# Patient Record
Sex: Female | Born: 1950
Health system: Southern US, Community
[De-identification: ages and names within clinical notes are randomized; demographics above are authoritative.]

## PROBLEM LIST (undated history)

## (undated) ENCOUNTER — Ambulatory Visit

## (undated) DIAGNOSIS — K219 Gastro-esophageal reflux disease without esophagitis: Secondary | ICD-10-CM

## (undated) DIAGNOSIS — G4730 Sleep apnea, unspecified: Secondary | ICD-10-CM

## (undated) DIAGNOSIS — R51 Headache: Secondary | ICD-10-CM

## (undated) DIAGNOSIS — F419 Anxiety disorder, unspecified: Secondary | ICD-10-CM

## (undated) DIAGNOSIS — F329 Major depressive disorder, single episode, unspecified: Secondary | ICD-10-CM

## (undated) DIAGNOSIS — H269 Unspecified cataract: Secondary | ICD-10-CM

## (undated) DIAGNOSIS — D126 Benign neoplasm of colon, unspecified: Secondary | ICD-10-CM

## (undated) DIAGNOSIS — T7840XA Allergy, unspecified, initial encounter: Secondary | ICD-10-CM

## (undated) DIAGNOSIS — M1990 Unspecified osteoarthritis, unspecified site: Secondary | ICD-10-CM

## (undated) DIAGNOSIS — F32A Depression, unspecified: Secondary | ICD-10-CM

## (undated) DIAGNOSIS — U071 COVID-19: Secondary | ICD-10-CM

## (undated) HISTORY — DX: Anxiety disorder, unspecified: F41.9

## (undated) HISTORY — PX: BREAST ENHANCEMENT SURGERY: SHX7

## (undated) HISTORY — PX: LUMBAR DISC SURGERY: SHX700

## (undated) HISTORY — DX: Unspecified cataract: H26.9

## (undated) HISTORY — DX: Major depressive disorder, single episode, unspecified: F32.9

## (undated) HISTORY — PX: COLONOSCOPY: SHX174

## (undated) HISTORY — DX: Headache: R51

## (undated) HISTORY — PX: TOTAL ABDOMINAL HYSTERECTOMY: SHX209

## (undated) HISTORY — DX: Gastro-esophageal reflux disease without esophagitis: K21.9

## (undated) HISTORY — PX: UPPER GASTROINTESTINAL ENDOSCOPY: SHX188

## (undated) HISTORY — PX: ANKLE SURGERY: SHX546

## (undated) HISTORY — DX: Unspecified osteoarthritis, unspecified site: M19.90

## (undated) HISTORY — DX: Benign neoplasm of colon, unspecified: D12.6

## (undated) HISTORY — DX: Allergy, unspecified, initial encounter: T78.40XA

## (undated) HISTORY — PX: TONSILLECTOMY: SUR1361

## (undated) HISTORY — DX: Depression, unspecified: F32.A

## (undated) HISTORY — DX: Sleep apnea, unspecified: G47.30

---

## 1997-07-19 ENCOUNTER — Other Ambulatory Visit: Admission: RE | Admit: 1997-07-19 | Discharge: 1997-07-19 | Payer: Self-pay | Admitting: Gynecology

## 1999-10-23 ENCOUNTER — Other Ambulatory Visit: Admission: RE | Admit: 1999-10-23 | Discharge: 1999-10-23 | Payer: Self-pay | Admitting: Obstetrics and Gynecology

## 1999-12-09 ENCOUNTER — Ambulatory Visit (HOSPITAL_COMMUNITY): Admission: RE | Admit: 1999-12-09 | Discharge: 1999-12-09 | Payer: Self-pay | Admitting: Orthopedic Surgery

## 1999-12-09 ENCOUNTER — Encounter: Payer: Self-pay | Admitting: Orthopedic Surgery

## 2000-11-05 ENCOUNTER — Other Ambulatory Visit: Admission: RE | Admit: 2000-11-05 | Discharge: 2000-11-05 | Payer: Self-pay | Admitting: Obstetrics and Gynecology

## 2001-12-10 ENCOUNTER — Other Ambulatory Visit: Admission: RE | Admit: 2001-12-10 | Discharge: 2001-12-10 | Payer: Self-pay | Admitting: Obstetrics and Gynecology

## 2003-01-06 ENCOUNTER — Other Ambulatory Visit: Admission: RE | Admit: 2003-01-06 | Discharge: 2003-01-06 | Payer: Self-pay | Admitting: Obstetrics and Gynecology

## 2003-05-05 ENCOUNTER — Encounter: Admission: RE | Admit: 2003-05-05 | Discharge: 2003-05-05 | Payer: Self-pay | Admitting: Internal Medicine

## 2003-06-02 ENCOUNTER — Encounter: Payer: Self-pay | Admitting: Gastroenterology

## 2003-06-25 DIAGNOSIS — D126 Benign neoplasm of colon, unspecified: Secondary | ICD-10-CM

## 2003-06-25 HISTORY — DX: Benign neoplasm of colon, unspecified: D12.6

## 2004-01-11 ENCOUNTER — Encounter: Admission: RE | Admit: 2004-01-11 | Discharge: 2004-01-11 | Payer: Self-pay | Admitting: Sports Medicine

## 2004-02-05 ENCOUNTER — Other Ambulatory Visit: Admission: RE | Admit: 2004-02-05 | Discharge: 2004-02-05 | Payer: Self-pay | Admitting: Obstetrics and Gynecology

## 2004-03-28 ENCOUNTER — Ambulatory Visit (HOSPITAL_COMMUNITY): Admission: RE | Admit: 2004-03-28 | Discharge: 2004-03-28 | Payer: Self-pay | Admitting: Obstetrics and Gynecology

## 2004-04-23 ENCOUNTER — Emergency Department (HOSPITAL_COMMUNITY): Admission: EM | Admit: 2004-04-23 | Discharge: 2004-04-24 | Payer: Self-pay | Admitting: Emergency Medicine

## 2004-05-02 ENCOUNTER — Inpatient Hospital Stay (HOSPITAL_COMMUNITY): Admission: RE | Admit: 2004-05-02 | Discharge: 2004-05-04 | Payer: Self-pay | Admitting: Obstetrics and Gynecology

## 2004-05-02 ENCOUNTER — Encounter (INDEPENDENT_AMBULATORY_CARE_PROVIDER_SITE_OTHER): Payer: Self-pay | Admitting: Specialist

## 2005-03-12 ENCOUNTER — Other Ambulatory Visit: Admission: RE | Admit: 2005-03-12 | Discharge: 2005-03-12 | Payer: Self-pay | Admitting: Obstetrics and Gynecology

## 2005-08-15 ENCOUNTER — Encounter: Admission: RE | Admit: 2005-08-15 | Discharge: 2005-08-15 | Payer: Self-pay | Admitting: Sports Medicine

## 2006-03-30 ENCOUNTER — Encounter: Admission: RE | Admit: 2006-03-30 | Discharge: 2006-03-30 | Payer: Self-pay | Admitting: Internal Medicine

## 2008-02-25 DIAGNOSIS — H269 Unspecified cataract: Secondary | ICD-10-CM

## 2008-02-25 HISTORY — DX: Unspecified cataract: H26.9

## 2008-04-12 ENCOUNTER — Encounter (INDEPENDENT_AMBULATORY_CARE_PROVIDER_SITE_OTHER): Payer: Self-pay | Admitting: *Deleted

## 2008-08-22 ENCOUNTER — Telehealth: Payer: Self-pay | Admitting: Gastroenterology

## 2008-10-02 ENCOUNTER — Ambulatory Visit: Payer: Self-pay | Admitting: Gastroenterology

## 2008-10-02 DIAGNOSIS — K589 Irritable bowel syndrome without diarrhea: Secondary | ICD-10-CM | POA: Insufficient documentation

## 2008-10-02 DIAGNOSIS — Z860100 Personal history of colon polyps, unspecified: Secondary | ICD-10-CM | POA: Insufficient documentation

## 2008-10-02 DIAGNOSIS — K219 Gastro-esophageal reflux disease without esophagitis: Secondary | ICD-10-CM | POA: Insufficient documentation

## 2008-10-06 ENCOUNTER — Telehealth: Payer: Self-pay | Admitting: Gastroenterology

## 2008-11-30 ENCOUNTER — Encounter (INDEPENDENT_AMBULATORY_CARE_PROVIDER_SITE_OTHER): Payer: Self-pay | Admitting: *Deleted

## 2008-12-19 ENCOUNTER — Ambulatory Visit: Payer: Self-pay | Admitting: Gastroenterology

## 2009-01-02 ENCOUNTER — Ambulatory Visit: Payer: Self-pay | Admitting: Gastroenterology

## 2009-01-02 ENCOUNTER — Encounter: Payer: Self-pay | Admitting: Gastroenterology

## 2009-01-02 ENCOUNTER — Telehealth: Payer: Self-pay | Admitting: Gastroenterology

## 2009-01-05 ENCOUNTER — Encounter: Payer: Self-pay | Admitting: Gastroenterology

## 2010-07-12 NOTE — Discharge Summary (Signed)
NAMEBREANNA, Taylor Hopkins             ACCOUNT NO.:  [id number]   MEDICAL RECORD NO.:  [id number]          PATIENT TYPE:  INP   LOCATION:  9316                          FACILITY:  [facility]   PHYSICIAN:  Guy Sandifer. Tomblin II, M.D.DATE OF BIRTH:  Jan 01, 1951   DATE OF ADMISSION:  05/02/2004  DATE OF DISCHARGE:  05/04/2004                                 DISCHARGE SUMMARY   ADMITTING DIAGNOSIS:  Left adnexal cyst.   DISCHARGE DIAGNOSIS:  Left adnexal cyst.   PROCEDURE:  On May 02, 2004, total abdominal hysterectomy with bilateral  salpingo-oophorectomy.   REASON FOR ADMISSION:  This patient is a 60 year old married white female  G3, P2 with a left adnexal mass.  Details are dictated in the history and  physical.  She is admitted for surgical management.   HOSPITAL COURSE:  The patient is admitted to the hospital and undergoes the  above procedure.  Estimated blood loss was 200 mL.  On the evening of  surgery, she has good pain relief and stable vital signs.  She remains  afebrile.  Urine output is somewhat concentrated, and she is given an IV  fluid bolus.  The following morning, she has good pain relief and is voiding  well.  White count is 7.8, hemoglobin 11.9.  On the day of discharge, she is  passing flatus, tolerating a regular diet.  Final pathology is pending.   CONDITION ON DISCHARGE:  Good.   DIET:  Regular as tolerated.   ACTIVITY:  No lifting, no operation of automobiles, no vaginal entry.  She  is to call the office for problems including but not limited to heavy  vaginal bleeding, temperature of 101 degrees, persistent nausea, vomiting,  or increasing pain.   MEDICATIONS:  1.  Percocet 5/325 mg #30 1-2 p.o. q.6h. p.r.n.  2.  Ibuprofen 600 mg q.6h. p.r.n.  3.  Multivitamin daily.   FOLLOW-UP:  In the office in 2 weeks.      JET/MEDQ  D:  05/04/2004  T:  05/04/2004  Job:  [id number]

## 2010-07-12 NOTE — Op Note (Signed)
Taylor Hopkins, Taylor Hopkins             ACCOUNT NO.:  [id number]   MEDICAL RECORD NO.:  [id number]          PATIENT TYPE:  INP   LOCATION:  9399                          FACILITY:  [facility]   PHYSICIAN:  Guy Sandifer. Tomblin II, M.D.DATE OF BIRTH:  Oct 21, 1950   DATE OF PROCEDURE:  05/02/2004  DATE OF DISCHARGE:                                 OPERATIVE REPORT   POSTOPERATIVE DIAGNOSIS:  Left adnexal cyst.   POSTOPERATIVE DIAGNOSIS:  Left adnexal cyst.   PROCEDURE:  Total abdominal hysterectomy with bilateral salpingo-  oophorectomy.   SURGEON:  Guy Sandifer. Henderson Cloud, M.D.   ASSISTANT:  Raynald Kemp, M.D.   ANESTHESIA:  General with endotracheal intubation.   SPECIMENS:  1.  Uterus with bilateral tubes and ovaries.  2.  Peritoneal washings.   ESTIMATED BLOOD LOSS:  200 cc.   INDICATIONS AND CONSENT:  This patient is a 60 year old married white  female, G3, P2 four years postmenopausal with acute onset of left lower  quadrant pain. She has a left adnexal cyst.  Details are dictated in the  history and physical.  Total abdominal hysterectomy and bilateral salpingo-  oophorectomy has been discussed with the patient.  The potential risks and  complications have been discussed, including but not limited to, infection,  bowel, bladder, ureteral damage; bleeding requiring transfusion of blood  products with possible transfusion reaction, HIV and hepatitis acquisition;  DVT, PE, pneumonia; fistula formation; and postoperative dyspareunia.  All  questions have been answered, and consent is signed on the chart.   FINDINGS:  Upper abdomen palpates normally.  There is a smooth 10 to 12-cm  left ovarian cyst that is partially torsed.  The right ovary and uterus are  normal.   DESCRIPTION OF PROCEDURE:  The patient is taken to the operating room where  she is identified and placed in the dorsal supine position.  General  anesthesia is induced via endotracheal intubation.  She is then prepped  abdominally and vaginally.  A Foley catheter is placed, and the bladder is  drained.  She is draped in a sterile fashion.   A Pfannenstiel incision is made, and dissection is carried out in layers to  the peritoneum which is entered and extended superiorly and inferiorly.  Peritoneal washings are then taken and sent for cytology.  An O'Connor-  O'Sullivan self-retaining retractor is placed.  A bladder blade is placed.  The bowel is packed away and the upper blade was placed.  The left ovary is  elevated into the field.  The ovary is well clear of the pelvic sidewall and  the course of the ureter.  The infundibulopelvic ligament is clamped just  beneath the ovary, and the specimen is cut free and sent to pathology.  The  pedicle was then doubly ligated with a free tie and then a suture of 0  Monocryl.  All sutures will be 0 Monocryl unless otherwise designated.  Kelly clamps are placed on the proximal ligaments bilaterally to elevate the  uterus.  Then using the harmonic scalpel, the left round ligament was taken  down as well as anterior leaf of  the broad ligament, and the bladder flap  was dissected.  The posterior leaf is perforated, and the left fallopian  tube is then taken through an additional window, taking the distal portion  of the infundibulopelvic ligament, which again, is well clear of the course  of the ureter.  This is again doubly ligated with a free tie and a suture.  The left uterine vessel is then skeletonized and taken with the harmonic  scalpel.  The right round ligament is then taken down.  The remainder of the  anterior leaf of the broad ligament is taken down.  The posterior leaf of  the broad ligament is perforated, and the left infundibulopelvic ligament is  taken with a clamp, scissor, and then doubly ligated.  The right uterine  vessel is skeletonized and is taken with a Kelly clamp and singly ligated.  Progressive bites are taken of the cardinal ligaments with  straight Heaney  clamps.  The bladder is advanced.  The left vaginal fornix is then entered,  and the specimen is cut free with scissors.  A right-angle suture is placed.  The cuff is then reapproximated with interrupted figure-of-eight sutures  which achieves good hemostasis.  The uterosacral ligaments were  reapproximated with a single stitch in the midline.  Irrigation is carried  out, and all returns were clear.  The anterior peritoneum is then closed in  running fashion with 0 Monocryl suture which is also used to reapproximate  the pyramidalis muscle in the midline.  The anterior rectus fascia is closed  in running fashion with 0 PDS suture, and the skin is closed with clips.  All sponge, instrument, and needle counts are correct.   The patient is transferred to the recovery room in stable condition.      JET/MEDQ  D:  05/02/2004  T:  05/02/2004  Job:  [id number]

## 2010-07-12 NOTE — H&P (Signed)
NAME:  Taylor Hopkins, Taylor Hopkins             ACCOUNT NO.:  [id number]   MEDICAL RECORD NO.:  [id number]          PATIENT TYPE:  INP   LOCATION:  NA                            [facility]ILITY:  [facility]   PHYSICIAN:  Guy Sandifer. Tomblin II, M.D.DATE OF BIRTH:  [birth year]   DATE OF ADMISSION:  05/02/2004  DATE OF DISCHARGE:                                HISTORY & PHYSICAL   CHIEF COMPLAINT:  Adnexal mass.   HISTORY OF PRESENT ILLNESS:  This patient is a 60 year old married white  female, G3, P2 with her last menstrual period approximately four years ago  who has had an acute onset of left lower quadrant pain.  Ultrasound on  March 22, 2004 is consistent with an approximately 11-cm left adnexal  cystic mass consistent with a probable dermoid cyst.  An abdominopelvic CT  scan on March 28, 2004 revealed a simple cyst on the right lower lobe of  the liver in the abdomen and the above-described left adnexal mass.  There  was no evidence of free fluid, organomegaly, or lymphadenopathy.  CA-125 on  March 22, 2004 was 12.0.  Pap smear in December of 2005 was benign.  The  patient recently had an exacerbation of the pain on April 23, 2004.  The  pain subsequently improved.  Repeat CT at that time was again consistent  with a 11 to 12-cm probable dermoid cyst in the left adnexa.  The patient is  being admitted for total abdominal hysterectomy with bilateral salpingo-  oophorectomy.  The potential risks and complications have been reviewed with  the patient preoperatively.   PAST MEDICAL HISTORY:  1.  GERD.  2.  Hiatal hernia.  3.  Depression and anxiety.   PAST SURGICAL HISTORY:  1.  Lumbar laminectomy in 1998.  2.  Tonsillectomy at age 59.   MEDICATIONS:  1.  Paxil CR 37.5 mg daily.  2.  Xanax p.r.n.  3.  Ambien p.r.n.  4.  Nexium daily.   ALLERGIES:  SULFA.   SOCIAL HISTORY:  The patient denies tobacco or drug abuse.  She drinks  alcohol on an occasional basis.   FAMILY HISTORY:  Type 2  diabetes in mother.  Esophageal, thyroid, and lung  cancer in maternal grandmother.  Chronic hypertension in mother and father.  Appendiceal cancer in father.  Heart disease in paternal grandmother.  Goiter in maternal grandmother.  Thyroid disorder in mother.   PAST OBSTETRICAL HISTORY:  Vaginal delivery x2.   REVIEW OF SYSTEMS:  NEUROLOGIC:  Denies headache.  PULMONARY:  Denies  shortness of breath.  CARDIAC:  Denies chest pain.  GI:  Denies changes in  bowel habits.   PHYSICAL EXAMINATION:  VITAL SIGNS:  Height 5 feet 6.75 inches, weight 212  pounds.  Blood pressure 128/92.  NECK:  Without thyromegaly.  LUNGS:  Clear to auscultation.  HEART:  Regular rate and rhythm.  BACK:  Without CVA tenderness.  BREASTS:  Without mass, retraction, discharge.  ABDOMEN:  Soft, nontender, without masses.  PELVIC:  Vulva, vagina, cervix without lesions.  Left adnexal fullness is  noted.  Exam is compromised by patient's  habitus.  EXTREMITIES:  Grossly within normal limits.  NEUROLOGIC:  Grossly within normal limits.   ASSESSMENT:  Left adnexal mass.   PLAN:  Total abdominal hysterectomy with bilateral salpingo-oophorectomy.      JET/MEDQ  D:  04/30/2004  T:  04/30/2004  Job:  [id number]

## 2011-01-31 ENCOUNTER — Ambulatory Visit (INDEPENDENT_AMBULATORY_CARE_PROVIDER_SITE_OTHER): Payer: 59 | Admitting: Pulmonary Disease

## 2011-01-31 ENCOUNTER — Encounter: Payer: Self-pay | Admitting: Pulmonary Disease

## 2011-01-31 VITALS — BP 128/80 | HR 87 | Temp 98.4°F | Ht 66.5 in | Wt 222.4 lb

## 2011-01-31 DIAGNOSIS — R0989 Other specified symptoms and signs involving the circulatory and respiratory systems: Secondary | ICD-10-CM

## 2011-01-31 DIAGNOSIS — R05 Cough: Secondary | ICD-10-CM

## 2011-01-31 DIAGNOSIS — R059 Cough, unspecified: Secondary | ICD-10-CM | POA: Insufficient documentation

## 2011-01-31 DIAGNOSIS — R0609 Other forms of dyspnea: Secondary | ICD-10-CM

## 2011-01-31 MED ORDER — OMEPRAZOLE 40 MG PO CPDR: 40.0000 mg | DELAYED_RELEASE_CAPSULE | Freq: Every day | ORAL | Status: DC

## 2011-01-31 NOTE — Progress Notes (Signed)
  Subjective:    Patient ID: Taylor Hopkins, female    DOB: 08-09-1950, 59 y.o.   MRN: [id number]  HPI The patient is a 60 year old female who I've been asked to see for possible asthma.  The patient states that every year she will get an episode of "bronchitis", and she typically will have persistent symptoms that require multiple rounds of antibiotics for resolution.  Her last episode was in October of this year, and lasted for at least 4 weeks.  During this time, she primarily has any persistent and nagging dry cough, but does not feel that shortness of breath is a significant issue.  She also had an episode last December that extended into March.  The patient has no history of asthma, but does have allergy issues with positive testing in the past.  She has an allergy vaccine in the past as well.  She notes it year-round allergy symptoms, but does not take an antihistamine on a regular basis.  She does have sinus symptoms fairly frequently.  She also complains of reflux symptoms, and has been on proton pump inhibitors in the past.  The patient does note postnasal drip, and is clearing her throat frequently during our visit.  When the patient is well, she does not have any significant dyspnea on exertion, cough, or wheezing.   Review of Systems  Constitutional: Negative for fever and unexpected weight change.  HENT: Positive for congestion. Negative for ear pain, nosebleeds, sore throat, rhinorrhea, sneezing, trouble swallowing, dental problem, postnasal drip and sinus pressure.   Eyes: Negative for redness and itching.  Respiratory: Positive for cough and shortness of breath. Negative for chest tightness and wheezing.   Cardiovascular: Negative for palpitations and leg swelling.  Gastrointestinal: Negative for nausea and vomiting.  Genitourinary: Negative for dysuria.  Musculoskeletal: Negative for joint swelling.  Skin: Negative for rash.  Neurological: Positive for headaches.  Hematological:  Does not bruise/bleed easily.  Psychiatric/Behavioral: Negative for dysphoric mood. The patient is not nervous/anxious.        Objective:   Physical Exam Constitutional: obese female, no acute distress  HENT:  Nares patent without discharge  Oropharynx without exudate, palate and uvula are normal  Eyes:  Perrla, eomi, no scleral icterus  Neck:  No JVD, no TMG  Cardiovascular:  Normal rate, regular rhythm, no rubs or gallops.  No murmurs        Intact distal pulses  Pulmonary :  Normal breath sounds, no stridor or respiratory distress   No rales, rhonchi, or wheezing  Abdominal:  Soft, nondistended, bowel sounds present.  No tenderness noted.   Musculoskeletal:  No lower extremity edema noted.  Lymph Nodes:  No cervical lymphadenopathy noted  Skin:  No cyanosis noted  Neurologic:  Alert, appropriate, moves all 4 extremities without obvious deficit.         Assessment & Plan:

## 2011-01-31 NOTE — Progress Notes (Signed)
Addended by: Fenton Foy on: 01/31/2011 10:01 AM   Modules accepted: Orders

## 2011-01-31 NOTE — Patient Instructions (Signed)
I think you should start on omeprazole once a day for acid reflux, and also zyrtec 10mg  at bedtime. Would like to see you when you develop a respiratory infection with persistent cough.

## 2011-01-31 NOTE — Assessment & Plan Note (Signed)
The patient has persistent pulmonary symptoms that consist primarily of cough whenever she gets a URI.  I cannot exclude the possibility of reactive airways disease, but from her history this sounds most consistent with an upper airway post viral cough.  The patient has known postnasal drip and reflux disease, and is clearing her throat frequently during our visit.  I suspect the likely scenario is that she has a hypersensitive as upper airway from reflux and postnasal drip, and this leads to a cyclical cough mechanism when she develops a URI.  Her spirometry today is normal, but this does not exclude the possibility of underlying asthma.  It is very important that I see her when she is having her episodes, and she is to arrange followup if this occurs.  In the meantime, I think she would benefit from medication for reflux and postnasal drip to see if she feels better.

## 2011-03-27 ENCOUNTER — Telehealth: Payer: Self-pay | Admitting: Allergy

## 2011-03-27 MED ORDER — OMEPRAZOLE 40 MG PO CPDR: 40.0000 mg | DELAYED_RELEASE_CAPSULE | Freq: Every day | ORAL | Status: DC

## 2011-03-27 NOTE — Telephone Encounter (Signed)
[facility]  [facility] supply  Omeprazole 40  Mg  1 qd  rx sent

## 2011-04-07 ENCOUNTER — Telehealth: Payer: Self-pay | Admitting: Pulmonary Disease

## 2011-04-07 MED ORDER — OMEPRAZOLE 40 MG PO CPDR: 40.0000 mg | DELAYED_RELEASE_CAPSULE | Freq: Every day | ORAL | Status: DC

## 2011-04-07 NOTE — Telephone Encounter (Signed)
I spoke with pt and she is requesting her prilosec sent to [facility]. I have sent rx and pt is aware

## 2011-06-12 ENCOUNTER — Other Ambulatory Visit: Payer: Self-pay | Admitting: Dermatology

## 2012-01-09 ENCOUNTER — Other Ambulatory Visit: Payer: Self-pay | Admitting: *Deleted

## 2012-01-09 MED ORDER — OMEPRAZOLE 40 MG PO CPDR: 40.0000 mg | DELAYED_RELEASE_CAPSULE | Freq: Every day | ORAL | Status: DC

## 2013-06-29 ENCOUNTER — Other Ambulatory Visit: Payer: Self-pay | Admitting: Dermatology

## 2013-11-01 ENCOUNTER — Encounter: Payer: Self-pay | Admitting: Gastroenterology

## 2014-05-24 ENCOUNTER — Other Ambulatory Visit: Payer: Self-pay | Admitting: Dermatology

## 2014-05-29 ENCOUNTER — Encounter: Payer: Self-pay | Admitting: Gastroenterology

## 2014-06-01 ENCOUNTER — Other Ambulatory Visit: Payer: Self-pay | Admitting: Obstetrics and Gynecology

## 2014-06-02 LAB — CYTOLOGY - PAP

## 2015-02-11 ENCOUNTER — Emergency Department (INDEPENDENT_AMBULATORY_CARE_PROVIDER_SITE_OTHER)
Admission: EM | Admit: 2015-02-11 | Discharge: 2015-02-11 | Disposition: A | Discharge status: Home / Self Care | Payer: 59 | Source: Home / Self Care | Attending: Emergency Medicine | Admitting: Emergency Medicine

## 2015-02-11 ENCOUNTER — Encounter (HOSPITAL_COMMUNITY): Payer: Self-pay | Admitting: Emergency Medicine

## 2015-02-11 ENCOUNTER — Other Ambulatory Visit (HOSPITAL_COMMUNITY)
Admission: RE | Admit: 2015-02-11 | Discharge: 2015-02-11 | Disposition: A | Discharge status: Home / Self Care | Payer: 59 | Source: Ambulatory Visit | Attending: Emergency Medicine | Admitting: Emergency Medicine

## 2015-02-11 DIAGNOSIS — N390 Urinary tract infection, site not specified: Secondary | ICD-10-CM | POA: Insufficient documentation

## 2015-02-11 LAB — POCT URINALYSIS DIP (DEVICE)
Bilirubin Urine: NEGATIVE
Glucose, UA: NEGATIVE mg/dL
Ketones, ur: NEGATIVE mg/dL
NITRITE: NEGATIVE
PH: 5.5 (ref 5.0–8.0)
Protein, ur: 30 mg/dL — AB
Specific Gravity, Urine: >=1.03 (ref 1.005–1.030)
UROBILINOGEN UA: 0.2 mg/dL (ref 0.0–1.0)

## 2015-02-11 MED ORDER — CEPHALEXIN 500 MG PO CAPS: 500.0000 mg | ORAL_CAPSULE | Freq: Four times a day (QID) | ORAL | Status: DC

## 2015-02-11 NOTE — ED Notes (Signed)
uti symptoms: pain with urination, noticed frequency, symptoms for 3 days not responding to azo C/o sinus symptoms for 3 weeks.  Saw her pcp 2 weeks ago: treated with augmentin and tussionex.  Complains of continued coughing, non-productive cough.  Patient does not think prior illness cleared up completely

## 2015-02-11 NOTE — Discharge Instructions (Signed)
You have a urinary tract infection. Take Keflex 4 times a day for 3 days. I sent your urine for culture. We will call you if we need to change antibiotics. You may have a lingering cough for another week or 2 from the sinus infection. You can mix equal parts water, honey, lemon juice, and whiskey as a cough syrup. Follow-up as needed.

## 2015-02-11 NOTE — ED Provider Notes (Signed)
CSN: [id number]     Arrival date & time 02/11/15  1608 History   First MD Initiated Contact with Patient 02/11/15 1651     Chief Complaint  Patient presents with  . Urinary Tract Infection   (Consider location/radiation/quality/duration/timing/severity/associated sxs/prior Treatment) HPI  She is a 64 year old woman here for evaluation of dysuria. She states this started about 3 days ago. She reports dysuria and foul-smelling urine. No frequency or urgency. No abdominal pain or flank pain. No fevers or chills. She also states she recently completed a course of Augmentin for sinus infection. She continues to have a cough.  Past Medical History  Diagnosis Date  . Allergic rhinitis   . Chronic headache    Past Surgical History  Procedure Laterality Date  . Tonsillecotmy    . Back surery    . Total abdominal hysterectomy    . Ankle surgery    . Breast enhancement surgery    . Delivered 2 babies  [id number]   Family History  Problem Relation Age of Onset  . Allergies Brother   . Heart attack Father    Social History  Substance Use Topics  . Smoking status: Never Smoker   . Smokeless tobacco: None  . Alcohol Use: None   OB History    No data available     Review of Systems As in history of present illness Allergies  Sulfonamide derivatives  Home Medications   Prior to Admission medications   Medication Sig Start Date End Date Taking? Authorizing Provider  busPIRone (BUSPAR) 10 MG tablet Take 1 tablet by mouth Twice daily. 01/16/11   Historical Provider, MD  cephALEXin (KEFLEX) 500 MG capsule Take 1 capsule (500 mg total) by mouth 4 (four) times daily. 02/11/15   Melony Overly, MD  citalopram (CELEXA) 40 MG tablet Take 20 mg by mouth daily. Alternates between 20mg  and 40 mg daily.  01/16/11   Historical Provider, MD  omeprazole (PRILOSEC) 40 MG capsule Take 1 capsule (40 mg total) by mouth daily. 01/09/12 01/08/13  Kathee Delton, MD  zolpidem (AMBIEN) 10 MG tablet Take 5  mg by mouth At bedtime. 01/16/11   Historical Provider, MD   Meds Ordered and Administered this Visit  Medications - No data to display  BP 155/90 mmHg  Pulse 91  Temp(Src) 98.1 F (36.7 C) (Oral)  Resp 16  SpO2 96% No data found.   Physical Exam  Constitutional: She is oriented to person, place, and time. She appears well-developed and well-nourished. No distress.  Cardiovascular: Normal rate.   Pulmonary/Chest: Effort normal.  Abdominal: Soft. There is no tenderness.  No CVA tenderness  Neurological: She is alert and oriented to person, place, and time.    ED Course  Procedures (including critical care time)  Labs Review Labs Reviewed  POCT URINALYSIS DIP (DEVICE) - Abnormal; Notable for the following:    Hgb urine dipstick MODERATE (*)    Protein, ur 30 (*)    Leukocytes, UA TRACE (*)    All other components within normal limits  URINE CULTURE    Imaging Review No results found.    MDM   1. UTI (lower urinary tract infection)    Treatment with Keflex. Urine sent for culture. Provided reassurance about the cough. Discussed home remedy.    Melony Overly, MD 02/11/15 [phone]

## 2015-02-13 LAB — URINE CULTURE: Culture: 60000

## 2015-06-14 ENCOUNTER — Encounter: Payer: Self-pay | Admitting: Gastroenterology

## 2015-08-15 ENCOUNTER — Ambulatory Visit (INDEPENDENT_AMBULATORY_CARE_PROVIDER_SITE_OTHER): Payer: 59 | Admitting: Gastroenterology

## 2015-08-15 ENCOUNTER — Encounter: Payer: Self-pay | Admitting: Gastroenterology

## 2015-08-15 VITALS — BP 134/76 | HR 80 | Ht 65.75 in | Wt 217.2 lb

## 2015-08-15 DIAGNOSIS — Z86010 Personal history of colonic polyps: Secondary | ICD-10-CM | POA: Diagnosis not present

## 2015-08-15 DIAGNOSIS — K219 Gastro-esophageal reflux disease without esophagitis: Secondary | ICD-10-CM

## 2015-08-15 MED ORDER — OMEPRAZOLE 40 MG PO CPDR: 40.0000 mg | DELAYED_RELEASE_CAPSULE | Freq: Every day | ORAL | Status: DC

## 2015-08-15 NOTE — Patient Instructions (Signed)
We have sent the following medications to your pharmacy for you to pick up at your convenience:omeprazole.  Patient advised to avoid spicy, acidic, citrus, chocolate, mints, fruit and fruit juices.  Limit the intake of caffeine, alcohol and Soda.  Don't exercise too soon after eating.  Don't lie down within 3-4 hours of eating.  Elevate the head of your bed.   It has been recommended to you by your physician that you have a(n) Colonoscopy completed. Per your request, we did not schedule the procedure(s) today. Please contact our office at [phone] should you decide to have the procedure completed.  Normal BMI (Body Mass Index- based on height and weight) is between 19 and 25. Your BMI today is Body mass index is 35.33 kg/(m^2). Marland Kitchen Please consider follow up  regarding your BMI with your Primary Care Provider.  Thank you for choosing me and [facility].  Pricilla Riffle. Dagoberto Ligas., MD., Marval Regal

## 2015-08-15 NOTE — Progress Notes (Signed)
    History of Present Illness: This is a 65 year old female self referred for the evaluation of GERD and epigastric pain. She has history of GERD and IBS and was last seen in 2010. She states her IBS symptoms have been inactive for many years and she feels they were related to stress. She has infrequent episodes of GERD primarily in the evenings and nights. About once per month she will notice heartburn and occasional regurgitation. She's been treated with rabeprazole and omeprazole in the past and she found that omeprazole 40 mg was more effective. She has not been on GERD medications for several months and notes reflux episodes about once per month. She is overdue for surveillance colonoscopy for history of adenomatous colon polyps. Denies weight loss, constipation, diarrhea, change in stool caliber, melena, hematochezia, nausea, vomiting, dysphagia, chest pain.  Review of Systems: Pertinent positive and negative review of systems were noted in the above HPI section. All other review of systems were otherwise negative.  Current Medications, Allergies, Past Medical History, Past Surgical History, Family History and Social History were reviewed in [employer] record.  Physical Exam: General: Well developed, well nourished, no acute distress Head: Normocephalic and atraumatic Eyes:  sclerae anicteric, EOMI Ears: Normal auditory acuity Mouth: No deformity or lesions Neck: Supple, no masses or thyromegaly Lungs: Clear throughout to auscultation Heart: Regular rate and rhythm; no murmurs, rubs or bruits Abdomen: Soft, non tender and non distended. No masses, hepatosplenomegaly or hernias noted. Normal Bowel sounds Musculoskeletal: Symmetrical with no gross deformities  Skin: No lesions on visible extremities Pulses:  Normal pulses noted Extremities: No clubbing, cyanosis, edema or deformities noted Neurological: Alert oriented x 4, grossly nonfocal Cervical Nodes:  No  significant cervical adenopathy Inguinal Nodes: No significant inguinal adenopathy Psychological:  Alert and cooperative. Normal mood and affect  Assessment and Recommendations:  1. GERD, mild. Follow all standard antireflux measures. Gaviscon when necessary. If her symptoms become more frequent, such as once or twice per week or more severe she can resume omeprazole 40 mg daily.  2. Personal history of adenomatous colon polyps overdue for a five-year surveillance colonoscopy. The risks (including bleeding, perforation, infection, missed lesions, medication reactions and possible hospitalization or surgery if complications occur), benefits, and alternatives to colonoscopy with possible biopsy and possible polypectomy were discussed with the patient and they consent to proceed. She states she will call back to schedule later this calendar year.

## 2016-02-07 ENCOUNTER — Other Ambulatory Visit (INDEPENDENT_AMBULATORY_CARE_PROVIDER_SITE_OTHER): Payer: Self-pay | Admitting: Specialist

## 2016-02-07 NOTE — Telephone Encounter (Signed)
Ok to fill 

## 2016-05-14 ENCOUNTER — Encounter: Payer: Self-pay | Admitting: Physician Assistant

## 2016-05-14 ENCOUNTER — Ambulatory Visit (INDEPENDENT_AMBULATORY_CARE_PROVIDER_SITE_OTHER): Payer: 59 | Admitting: Physician Assistant

## 2016-05-14 VITALS — BP 130/70 | HR 82 | Ht 65.75 in | Wt 215.0 lb

## 2016-05-14 DIAGNOSIS — Z86010 Personal history of colonic polyps: Secondary | ICD-10-CM | POA: Diagnosis not present

## 2016-05-14 DIAGNOSIS — K219 Gastro-esophageal reflux disease without esophagitis: Secondary | ICD-10-CM | POA: Diagnosis not present

## 2016-05-14 DIAGNOSIS — R1310 Dysphagia, unspecified: Secondary | ICD-10-CM

## 2016-05-14 MED ORDER — NA SULFATE-K SULFATE-MG SULF 17.5-3.13-1.6 GM/177ML PO SOLN: 1.0000 | Freq: Once | ORAL | 0 refills | Status: AC

## 2016-05-14 MED ORDER — OMEPRAZOLE 40 MG PO CPDR: 40.0000 mg | DELAYED_RELEASE_CAPSULE | Freq: Every day | ORAL | 6 refills | Status: DC

## 2016-05-14 NOTE — Patient Instructions (Signed)
We have sent the following medications to your pharmacy for you to pick up at your convenience:  Omeprazole  You have been scheduled for an endoscopy and colonoscopy. Please follow the written instructions given to you at your visit today. Please pick up your prep supplies at the pharmacy within the next 1-3 days. If you use inhalers (even only as needed), please bring them with you on the day of your procedure. Your physician has requested that you go to www.startemmi.com and enter the access code given to you at your visit today. This web site gives a general overview about your procedure. However, you should still follow specific instructions given to you by our office regarding your preparation for the procedure.   

## 2016-05-14 NOTE — Progress Notes (Signed)
Subjective:    Patient ID: Taylor Hopkins, female    DOB: 1950/04/04, 66 y.o.   MRN: [id number]  HPI Taylor Hopkins is a pleasant 66 year old white female known to Dr. Fuller Plan who comes in today with complaints of intermittent solid food dysphagia. She says these episodes are happening at least a couple of times every month and that she generally doesn't have any difficulty in between. She's not having any regular heartburn or indigestion. She generally will have to stop eating with an episode, feel some discomfort and then generally food gradually goes on down, she's not having to regurgitate. She does have a prescription for omeprazole but says she's not been taking it on a regular basis. Patient had previous EGD done in 2005 with finding of a small hiatal hernia. She had colonoscopy last in November 2010, 2 polyps were removed one was a serrated adenoma the other hyperplastic. She was to follow-up in 5 years. She currently has no lower GI complaints.  Review of Systems Pertinent positive and negative review of systems were noted in the above HPI section.  All other review of systems was otherwise negative.  Outpatient Encounter Prescriptions as of 05/14/2016  Medication Sig  . busPIRone (BUSPAR) 10 MG tablet Take 1 tablet by mouth Twice daily.  . citalopram (CELEXA) 40 MG tablet Take 20 mg by mouth daily. Alternates between 3m and 40 mg daily.   . meloxicam (MOBIC) 15 MG tablet Take 15 mg by mouth daily as needed.  . zolpidem (AMBIEN) 10 MG tablet Take 5 mg by mouth At bedtime.  . [DISCONTINUED] omeprazole (PRILOSEC) 40 MG capsule Take 40 mg by mouth daily as needed.  . Na Sulfate-K Sulfate-Mg Sulf 17.5-3.13-1.6 GM/180ML SOLN Take 1 kit by mouth once.  .Marland Kitchenomeprazole (PRILOSEC) 40 MG capsule Take 1 capsule (40 mg total) by mouth daily.  . [DISCONTINUED] meloxicam (MOBIC) 15 MG tablet TAKE 1/2-1 EVERY DAY WITH FOOD X 14 DAYS, THEN AS NEEDED (TAKE WITH FOOD)  . [DISCONTINUED] omeprazole (PRILOSEC)  40 MG capsule Take 1 capsule (40 mg total) by mouth daily.   No facility-administered encounter medications on file as of 05/14/2016.    Allergies  Allergen Reactions  . Sulfonamide Derivatives    Patient Active Problem List   Diagnosis Date Noted  . Cough 01/31/2011  . GERD 10/02/2008  . IRRITABLE BOWEL SYNDROME 10/02/2008  . PERSONAL HX COLONIC POLYPS 10/02/2008   Social History   Social History  . Marital status: Married    Spouse name: TCoralyn Mark . Number of children: 2  . Years of education: N/A   Occupational History  . Engineer    Social History Main Topics  . Smoking status: Never Smoker  . Smokeless tobacco: Never Used  . Alcohol use 0.0 oz/week     Comment: 1 per month  . Drug use: No  . Sexual activity: Not on file   Other Topics Concern  . Not on file   Social History Narrative  . No narrative on file    Ms. Karr's family history includes Allergies in her brother; Colon polyps in her father; Diverticulitis in her father; Esophageal cancer in her maternal grandmother; Heart attack in her father.      Objective:    Vitals:   05/14/16 0830  BP: 130/70  Pulse: 82    Physical Exam   Well  developed older white female in no acute distress, blood pressure 130/70 pulse 82, Height 5 foot 5, weight 2:15, BMI 34.9. HEENT;  nontraumatic normocephalic EOMI PERRLA sclera anicteric, Cardiovascular; regular rate and rhythm with S1-S2 no murmur rub or gallop, Pulmonary; clear bilaterally, Abdomen ;soft, nontender nondistended bowel sounds are active there is no palpable mass or hepatosplenomegaly, Rectal;exam not done, Ext; no clubbing cyanosis or edema skin warm and dry, Neuropsych ;mood and affect appropriate       Assessment & Plan:   #6 65 year old female with intermittent solid food dysphagia over the past year. Rule out distal esophageal stricture or ring. #2 History of serrated adenomatous colon polyp at last colonoscopy November 2010-overdue for  follow-up #3 history of IBS  Plan; Patient will be scheduled for EGD with probable esophageal dilation and colonoscopy with Dr. Fuller Plan. Both procedures discussed in detail with patient including risks and benefits and she is agreeable to proceed. We discussed recommendation to stay on chronic acid suppression  In event she does have an esophageal stricture, and she says she will resume omeprazole 40 mg by mouth every morning.  Taylor Hakeem Genia Harold PA-C 05/14/2016   Cc: Deland Pretty, MD

## 2016-05-14 NOTE — Progress Notes (Signed)
Reviewed and agree with management plan.  Elisabel Hanover T. Emersynn Deatley, MD FACG 

## 2016-07-08 ENCOUNTER — Encounter: Payer: Self-pay | Admitting: Gastroenterology

## 2016-07-14 ENCOUNTER — Telehealth: Payer: Self-pay | Admitting: Gastroenterology

## 2016-07-14 MED ORDER — NA SULFATE-K SULFATE-MG SULF 17.5-3.13-1.6 GM/177ML PO SOLN: 1.0000 | Freq: Once | ORAL | 0 refills | Status: AC

## 2016-07-14 NOTE — Telephone Encounter (Signed)
Prescription sent to patient's pharmacy.

## 2016-07-16 ENCOUNTER — Encounter: Payer: Self-pay | Admitting: Gastroenterology

## 2016-07-16 ENCOUNTER — Ambulatory Visit (AMBULATORY_SURGERY_CENTER): Payer: 59 | Admitting: Gastroenterology

## 2016-07-16 VITALS — BP 126/75 | HR 68 | Temp 98.4°F | Resp 14 | Ht 65.75 in | Wt 215.0 lb

## 2016-07-16 DIAGNOSIS — D122 Benign neoplasm of ascending colon: Secondary | ICD-10-CM

## 2016-07-16 DIAGNOSIS — K219 Gastro-esophageal reflux disease without esophagitis: Secondary | ICD-10-CM

## 2016-07-16 DIAGNOSIS — Z86010 Personal history of colonic polyps: Secondary | ICD-10-CM | POA: Diagnosis present

## 2016-07-16 DIAGNOSIS — D124 Benign neoplasm of descending colon: Secondary | ICD-10-CM

## 2016-07-16 DIAGNOSIS — K6389 Other specified diseases of intestine: Secondary | ICD-10-CM | POA: Diagnosis not present

## 2016-07-16 DIAGNOSIS — K639 Disease of intestine, unspecified: Secondary | ICD-10-CM | POA: Diagnosis not present

## 2016-07-16 DIAGNOSIS — R1310 Dysphagia, unspecified: Secondary | ICD-10-CM | POA: Diagnosis not present

## 2016-07-16 DIAGNOSIS — K222 Esophageal obstruction: Secondary | ICD-10-CM

## 2016-07-16 DIAGNOSIS — K529 Noninfective gastroenteritis and colitis, unspecified: Secondary | ICD-10-CM | POA: Diagnosis not present

## 2016-07-16 MED ORDER — SODIUM CHLORIDE 0.9 % IV SOLN: 500.0000 mL | INTRAVENOUS | Status: DC

## 2016-07-16 NOTE — Progress Notes (Signed)
A and O x3. Report to RN. Tolerated MAC anesthesia well.Teeth unchanged after procedure.

## 2016-07-16 NOTE — Patient Instructions (Addendum)
YOU HAD AN ENDOSCOPIC PROCEDURE TODAY AT [facility]:   Refer to the procedure report that was given to you for any specific questions about what was found during the examination.  If the procedure report does not answer your questions, please call your gastroenterologist to clarify.  If you requested that your care partner not be given the details of your procedure findings, then the procedure report has been included in a sealed envelope for you to review at your convenience later.  YOU SHOULD EXPECT: Some feelings of bloating in the abdomen. Passage of more gas than usual.  Walking can help get rid of the air that was put into your GI tract during the procedure and reduce the bloating. If you had a lower endoscopy (such as a colonoscopy or flexible sigmoidoscopy) you may notice spotting of blood in your stool or on the toilet paper. If you underwent a bowel prep for your procedure, you may not have a normal bowel movement for a few days.  Please Note:  You might notice some irritation and congestion in your nose or some drainage.  This is from the oxygen used during your procedure.  There is no need for concern and it should clear up in a day or so.  SYMPTOMS TO REPORT IMMEDIATELY:   Following lower endoscopy (colonoscopy or flexible sigmoidoscopy):  Excessive amounts of blood in the stool  Significant tenderness or worsening of abdominal pains  Swelling of the abdomen that is new, acute  Fever of 100F or higher   Following upper endoscopy (EGD)  Vomiting of blood or coffee ground material  New chest pain or pain under the shoulder blades  Painful or persistently difficult swallowing  New shortness of breath  Fever of 100F or higher  Black, tarry-looking stools  For urgent or emergent issues, a gastroenterologist can be reached at any hour by calling [phone].   DIET:  Follow Post Dilatation Diet.  Drink plenty of fluids but you should avoid alcoholic beverages  for 24 hours.  ACTIVITY:  You should plan to take it easy for the rest of today and you should NOT DRIVE or use heavy machinery until tomorrow (because of the sedation medicines used during the test).    FOLLOW UP: Our staff will call the number listed on your records the next business day following your procedure to check on you and address any questions or concerns that you may have regarding the information given to you following your procedure. If we do not reach you, we will leave a message.  However, if you are feeling well and you are not experiencing any problems, there is no need to return our call.  We will assume that you have returned to your regular daily activities without incident.  If any biopsies were taken you will be contacted by phone or by letter within the next 1-3 weeks.  Please call us at [phone] if you have not heard about the biopsies in 3 weeks.   No Ibuprofen,Aspirin, Naproxen or other non-steriodal anti-inflammatory drugs for two weeks after polyps removal/ Polyps (handout given) Hiatal Hernia Hemorrhoids (handout given) Post Esophageal Dilatation Diet Esophageal Stricture (handout given )Diverticulosis (handout given)  SIGNATURES/CONFIDENTIALITY: You and/or your care partner have signed paperwork which will be entered into your electronic medical record.  These signatures attest to the fact that that the information above on your After Visit Summary has been reviewed and is understood.  Full responsibility of the confidentiality of this  discharge information lies with you and/or your care-partner. 

## 2016-07-16 NOTE — Op Note (Signed)
[facility] Patient Name: Taylor Hopkins Procedure Date: 07/16/2016 7:58 AM MRN: [id number] Endoscopist: Ladene Artist , MD Age: 66 Referring MD:  Date of Birth: 24-May-1950 Gender: Female Account #: [id number] Procedure:                Upper GI endoscopy Indications:              Dysphagia Medicines:                Monitored Anesthesia Care Procedure:                Pre-Anesthesia Assessment:                           - Prior to the procedure, a History and Physical                            was performed, and patient medications and                            allergies were reviewed. The patient's tolerance of                            previous anesthesia was also reviewed. The risks                            and benefits of the procedure and the sedation                            options and risks were discussed with the patient.                            All questions were answered, and informed consent                            was obtained. Prior Anticoagulants: The patient has                            taken no previous anticoagulant or antiplatelet                            agents. ASA Grade Assessment: II - A patient with                            mild systemic disease. After reviewing the risks                            and benefits, the patient was deemed in                            satisfactory condition to undergo the procedure.                           After obtaining informed consent, the endoscope was  passed under direct vision. Throughout the                            procedure, the patient's blood pressure, pulse, and                            oxygen saturations were monitored continuously. The                            Endoscope was introduced through the mouth, and                            advanced to the second part of duodenum. The upper                            GI endoscopy was accomplished without  difficulty.                            The patient tolerated the procedure well. Scope In: Scope Out: Findings:                 One moderate benign-appearing, intrinsic stenosis                            was found at the gastroesophageal junction. This                            measured 1.4 cm (inner diameter) and was traversed.                            A guidewire was placed and the scope was withdrawn.                            Dilations were performed with Savary dilators with                            mild resistance at 15 mm and 16 mm. No heme noted.                           The exam of the esophagus was otherwise normal.                           A small hiatal hernia was present.                           The exam of the stomach was otherwise normal.                           The duodenal bulb and second portion of the                            duodenum were normal. Complications:            No immediate complications. Estimated Blood Loss:     Estimated  blood loss: none. Impression:               - Benign-appearing esophageal stenosis. Dilated.                           - Small hiatal hernia.                           - Normal duodenal bulb and second portion of the                            duodenum.                           - No specimens collected. Recommendation:           - Patient has a contact number available for                            emergencies. The signs and symptoms of potential                            delayed complications were discussed with the                            patient. Return to normal activities tomorrow.                            Written discharge instructions were provided to the                            patient.                           - Clear liquid diet for 2 hours, then advance as                            tolerated to soft diet today. Resume prior diet                            tomorrow.                           -  Continue present medications including omeprazole                            40 mg daily long term.                           - Return to GI office in 6 weeks. Ladene Artist, MD 07/16/2016 8:44:02 AM This report has been signed electronically.

## 2016-07-16 NOTE — Op Note (Signed)
[facility] Patient Name: Taylor Hopkins Procedure Date: 07/16/2016 8:00 AM MRN: [id number] Endoscopist: Ladene Artist , MD Age: 66 Referring MD:  Date of Birth: 08-09-1950 Gender: Female Account #: [id number] Procedure:                Colonoscopy Indications:              Surveillance: Personal history of adenomatous                            polyps on last colonoscopy > 5 years ago Medicines:                Monitored Anesthesia Care Procedure:                Pre-Anesthesia Assessment:                           - Prior to the procedure, a History and Physical                            was performed, and patient medications and                            allergies were reviewed. The patient's tolerance of                            previous anesthesia was also reviewed. The risks                            and benefits of the procedure and the sedation                            options and risks were discussed with the patient.                            All questions were answered, and informed consent                            was obtained. Prior Anticoagulants: The patient has                            taken no previous anticoagulant or antiplatelet                            agents. ASA Grade Assessment: II - A patient with                            mild systemic disease. After reviewing the risks                            and benefits, the patient was deemed in                            satisfactory condition to undergo the procedure.  After obtaining informed consent, the colonoscope                            was passed under direct vision. Throughout the                            procedure, the patient's blood pressure, pulse, and                            oxygen saturations were monitored continuously. The                            Model PCF-[id number]) scope was introduced                            through the anus and  advanced to the the cecum,                            identified by appendiceal orifice and ileocecal                            valve. The ileocecal valve, appendiceal orifice,                            and rectum were photographed. The quality of the                            bowel preparation was good. The colonoscopy was                            performed without difficulty. The patient tolerated                            the procedure well. Scope In: 8:10:09 AM Scope Out: 8:24:03 AM Scope Withdrawal Time: 0 hours 10 minutes 26 seconds  Total Procedure Duration: 0 hours 13 minutes 54 seconds  Findings:                 The perianal and digital rectal examinations were                            normal.                           A 14 mm polyp was found in the ascending colon. The                            polyp was sessile. The polyp was removed with a hot                            snare. Resection and retrieval were complete.                           A 6 mm polyp was found in the descending colon. The  polyp was sessile. The polyp was removed with a                            cold snare. Resection and retrieval were complete.                           Localized mild inflammation characterized by                            erythema, friability and granularity was found in                            the ascending colon and in the cecum. Biopsies were                            taken with a cold forceps for histology.                           Multiple medium-mouthed diverticula were found in                            the left colon. There was no evidence of                            diverticular bleeding.                           Internal hemorrhoids were found during                            retroflexion. The hemorrhoids were small and Grade                            I (internal hemorrhoids that do not prolapse).                           The  exam was otherwise without abnormality on                            direct and retroflexion views. Complications:            No immediate complications. Estimated blood loss:                            None. Estimated Blood Loss:     Estimated blood loss: none. Impression:               - One 14 mm polyp in the ascending colon, removed                            with a hot snare. Resected and retrieved.                           - One 6 mm polyp in the descending colon, removed  with a cold snare. Resected and retrieved.                           - Localized mild inflammation was found in the                            ascending colon and in the cecum secondary to                            colitis. Biopsied.                           - Mild diverticulosis in the left colon. There was                            no evidence of diverticular bleeding.                           - Internal hemorrhoids.                           - The examination was otherwise normal on direct                            and retroflexion views. Recommendation:           - Repeat colonoscopy in 3 - 5 years for                            surveillance pending path review.                           - Patient has a contact number available for                            emergencies. The signs and symptoms of potential                            delayed complications were discussed with the                            patient. Return to normal activities tomorrow.                            Written discharge instructions were provided to the                            patient.                           - Resume previous diet.                           - Continue present medications.                           - Await  pathology results.                           - No aspirin, ibuprofen, naproxen, or other                            non-steroidal anti-inflammatory drugs for 2 weeks                             after polyp removal. Ladene Artist, MD 07/16/2016 8:34:56 AM This report has been signed electronically.

## 2016-07-16 NOTE — Progress Notes (Signed)
Called to room to assist during endoscopic procedure.  Patient ID and intended procedure confirmed with present staff. Received instructions for my participation in the procedure from the performing physician.  

## 2016-07-17 ENCOUNTER — Telehealth: Payer: Self-pay | Admitting: *Deleted

## 2016-07-17 NOTE — Telephone Encounter (Signed)
   Follow up Call-  Call back number 07/16/2016  Post procedure Call Back phone  # [phone]  Permission to leave phone message Yes  Some recent data might be hidden     Patient questions:  Do you have a fever, pain , or abdominal swelling? No. Pain Score  0 *  Have you tolerated food without any problems? Yes.    Have you been able to return to your normal activities? Yes.    Do you have any questions about your discharge instructions: Diet   No. Medications  No. Follow up visit  No.  Do you have questions or concerns about your Care? No.  Actions: * If pain score is 4 or above: No action needed, pain <4.

## 2016-07-27 ENCOUNTER — Encounter: Payer: Self-pay | Admitting: Gastroenterology

## 2016-08-04 ENCOUNTER — Other Ambulatory Visit: Payer: Self-pay | Admitting: Gastroenterology

## 2016-10-03 ENCOUNTER — Other Ambulatory Visit (INDEPENDENT_AMBULATORY_CARE_PROVIDER_SITE_OTHER): Payer: Self-pay | Admitting: Specialist

## 2016-10-03 NOTE — Telephone Encounter (Signed)
Meloxicam refill request

## 2016-12-14 ENCOUNTER — Other Ambulatory Visit: Payer: Self-pay | Admitting: Physician Assistant

## 2017-03-02 ENCOUNTER — Other Ambulatory Visit (INDEPENDENT_AMBULATORY_CARE_PROVIDER_SITE_OTHER): Payer: Self-pay | Admitting: Specialist

## 2017-03-02 MED ORDER — MELOXICAM 15 MG PO TABS: ORAL_TABLET | ORAL | 3 refills | Status: DC

## 2018-01-01 ENCOUNTER — Ambulatory Visit (HOSPITAL_COMMUNITY)
Admission: EM | Admit: 2018-01-01 | Discharge: 2018-01-01 | Disposition: A | Discharge status: Home / Self Care | Payer: Medicare Other | Attending: Internal Medicine | Admitting: Internal Medicine

## 2018-01-01 ENCOUNTER — Encounter (HOSPITAL_COMMUNITY): Payer: Self-pay | Admitting: Emergency Medicine

## 2018-01-01 DIAGNOSIS — H6691 Otitis media, unspecified, right ear: Secondary | ICD-10-CM

## 2018-01-01 DIAGNOSIS — J208 Acute bronchitis due to other specified organisms: Secondary | ICD-10-CM

## 2018-01-01 DIAGNOSIS — J209 Acute bronchitis, unspecified: Secondary | ICD-10-CM

## 2018-01-01 MED ORDER — BENZONATATE 200 MG PO CAPS: 200.0000 mg | ORAL_CAPSULE | Freq: Three times a day (TID) | ORAL | 0 refills | Status: DC | PRN

## 2018-01-01 MED ORDER — TRIAMCINOLONE ACETONIDE 55 MCG/ACT NA AERO: 2.0000 | INHALATION_SPRAY | Freq: Every day | NASAL | 0 refills | Status: DC

## 2018-01-01 MED ORDER — AMOXICILLIN-POT CLAVULANATE 875-125 MG PO TABS: 1.0000 | ORAL_TABLET | Freq: Two times a day (BID) | ORAL | 0 refills | Status: AC

## 2018-01-01 NOTE — ED Triage Notes (Signed)
Pt c/o cough, low grade fever, congestion since Tuesday.

## 2018-01-01 NOTE — Discharge Instructions (Addendum)
Anticipate gradual improvement in hard coughing, well being, over the next several days.  Cough may take a couple weeks to subside.  Exam was consistent with bronchitis (probably viral) and a mild right ear infection; prescription for amoxicillin/clavulanate (for the ear infection) and for a nasal steroid (for congestion) and for benzonatate (for cough) were sent to the pharmacy.  Push fluids and rest.  Recheck for new fever >100.5, increasing phlegm production/nasal discharge, or if not starting to improve in a few days.

## 2018-01-04 NOTE — ED Provider Notes (Signed)
[place]    CSN: [id number] Arrival date & time: 01/01/18  1035     History   Chief Complaint Chief Complaint  Patient presents with  . Cough    HPI Taylor Hopkins is a 67 y.o. female. She presents today with a few days' history of frequent cough, low grade temps.  Runny/congested nose.  No GI upset.  Maybe some achiness, malaise.  Husband has similar sx's.      HPI  Past Medical History:  Diagnosis Date  . Adenomatous colon polyp 06/2003  . Allergic rhinitis   . Anxiety   . Arthritis   . Cataract 2010   bilateral eyes  . Chronic headache   . Depression   . GERD (gastroesophageal reflux disease)   . Sleep apnea     Patient Active Problem List   Diagnosis Date Noted  . Cough 01/31/2011  . GERD 10/02/2008  . IRRITABLE BOWEL SYNDROME 10/02/2008  . PERSONAL HX COLONIC POLYPS 10/02/2008    Past Surgical History:  Procedure Laterality Date  . ANKLE SURGERY Right   . BREAST ENHANCEMENT SURGERY    . delivered 2 babies  [id number]  . LUMBAR DISC SURGERY    . TONSILLECTOMY    . TOTAL ABDOMINAL HYSTERECTOMY        Home Medications    Prior to Admission medications   Medication Sig Start Date End Date Taking? Authorizing Provider  raloxifene (EVISTA) 60 MG tablet Take 60 mg by mouth daily.   Yes [provider]  amoxicillin-clavulanate (AUGMENTIN) 875-125 MG tablet Take 1 tablet by mouth 2 (two) times daily for 7 days. 01/01/18 01/08/18  Wynona Luna, MD  benzonatate (TESSALON) 200 MG capsule Take 1 capsule (200 mg total) by mouth 3 (three) times daily as needed for cough. 01/01/18   Wynona Luna, MD  busPIRone (BUSPAR) 10 MG tablet Take 1 tablet by mouth Twice daily. 01/16/11   [provider]  citalopram (CELEXA) 40 MG tablet Take 20 mg by mouth daily. Alternates between 20mg  and 40 mg daily.  01/16/11   [provider]  meloxicam (MOBIC) 15 MG tablet Take 1/2-1 tablet po every day with food for 14 days  then once a day as needed(always take with food) 03/02/17   Jessy Oto, MD  omeprazole (PRILOSEC) 40 MG capsule TAKE ONE CAPSULE BY MOUTH DAILY 08/04/16   Ladene Artist, MD  omeprazole (PRILOSEC) 40 MG capsule TAKE 1 CAPSULE (40 MG TOTAL) BY MOUTH DAILY. 12/15/16   Esterwood, Amy S, PA-C  triamcinolone (NASACORT) 55 MCG/ACT AERO nasal inhaler Place 2 sprays into the nose daily. 01/01/18   Wynona Luna, MD  zolpidem (AMBIEN) 10 MG tablet Take 5 mg by mouth At bedtime. 01/16/11   [provider]    Family History Family History  Problem Relation Age of Onset  . Allergies Brother   . Heart attack Father   . Colon polyps Father   . Diverticulitis Father   . Esophageal cancer Maternal Grandmother   . Stomach cancer Paternal Uncle   . Colon cancer Neg Hx     Social History Social History   Tobacco Use  . Smoking status: Never Smoker  . Smokeless tobacco: Never Used  Substance Use Topics  . Alcohol use: Yes    Alcohol/week: 0.0 standard drinks    Comment: 1 per month  . Drug use: No     Allergies   Sulfonamide derivatives   Review of Systems Review of  Systems  All other systems reviewed and are negative.    Physical Exam Triage Vital Signs ED Triage Vitals [01/01/18 1220]  Enc Vitals Group     BP 137/65     Pulse Rate 85     Resp 18     Temp 98 F (36.7 C)     Temp src      SpO2 99 %     Weight      Height      Pain Score 0     Pain Loc    Updated Vital Signs BP 137/65   Pulse 85   Temp 98 F (36.7 C)   Resp 18   SpO2 99%   Physical Exam  Constitutional: She is oriented to person, place, and time. No distress.  Voice sounds congested  HENT:  Head: Atraumatic.  B TMs are dull, R TM is red, L TM is without erythema Moderate nasal congestion bilat with mucopurulent material present Throat is injected  Eyes:  Conjugate gaze observed, no eye redness/discharge  Neck: Neck supple.  Cardiovascular: Normal rate and regular rhythm.    Pulmonary/Chest: No respiratory distress.  Lungs clear, symmetric breath sounds   Abdominal: She exhibits no distension.  Musculoskeletal: Normal range of motion.  Neurological: She is alert and oriented to person, place, and time.  Skin: Skin is warm and dry.  Nursing note and vitals reviewed.    Final Clinical Impressions(s) / UC Diagnoses   Final diagnoses:  Acute bronchitis due to infection  Acute right otitis media     Discharge Instructions     Anticipate gradual improvement in hard coughing, well being, over the next several days.  Cough may take a couple weeks to subside.  Exam was consistent with bronchitis (probably viral) and a mild right ear infection; prescription for amoxicillin/clavulanate (for the ear infection) and for a nasal steroid (for congestion) and for benzonatate (for cough) were sent to the pharmacy.  Push fluids and rest.  Recheck for new fever >100.5, increasing phlegm production/nasal discharge, or if not starting to improve in a few days.      ED Prescriptions    Medication Sig Dispense Auth. Provider   amoxicillin-clavulanate (AUGMENTIN) 875-125 MG tablet Take 1 tablet by mouth 2 (two) times daily for 7 days. 14 tablet Wynona Luna, MD   triamcinolone (NASACORT) 55 MCG/ACT AERO nasal inhaler Place 2 sprays into the nose daily. 1 Inhaler Wynona Luna, MD   benzonatate (TESSALON) 200 MG capsule Take 1 capsule (200 mg total) by mouth 3 (three) times daily as needed for cough. 30 capsule Wynona Luna, MD       Wynona Luna, MD 01/04/18 [phone]

## 2018-03-08 DIAGNOSIS — M7062 Trochanteric bursitis, left hip: Secondary | ICD-10-CM | POA: Diagnosis not present

## 2018-03-08 DIAGNOSIS — M7061 Trochanteric bursitis, right hip: Secondary | ICD-10-CM | POA: Diagnosis not present

## 2018-03-22 DIAGNOSIS — Z23 Encounter for immunization: Secondary | ICD-10-CM | POA: Diagnosis not present

## 2018-04-15 ENCOUNTER — Other Ambulatory Visit (INDEPENDENT_AMBULATORY_CARE_PROVIDER_SITE_OTHER): Payer: Self-pay | Admitting: Specialist

## 2018-04-15 NOTE — Telephone Encounter (Signed)
meloxicam refill request 

## 2018-04-28 DIAGNOSIS — I781 Nevus, non-neoplastic: Secondary | ICD-10-CM | POA: Diagnosis not present

## 2018-04-28 DIAGNOSIS — L910 Hypertrophic scar: Secondary | ICD-10-CM | POA: Diagnosis not present

## 2018-07-02 ENCOUNTER — Encounter: Payer: Self-pay | Admitting: Family Medicine

## 2018-07-02 ENCOUNTER — Ambulatory Visit (INDEPENDENT_AMBULATORY_CARE_PROVIDER_SITE_OTHER): Payer: Medicare Other | Admitting: Family Medicine

## 2018-07-02 ENCOUNTER — Other Ambulatory Visit: Payer: Self-pay

## 2018-07-02 VITALS — Temp 97.8°F | Ht 66.75 in | Wt 215.0 lb

## 2018-07-02 DIAGNOSIS — F5101 Primary insomnia: Secondary | ICD-10-CM | POA: Diagnosis not present

## 2018-07-02 DIAGNOSIS — R1013 Epigastric pain: Secondary | ICD-10-CM

## 2018-07-02 DIAGNOSIS — K589 Irritable bowel syndrome without diarrhea: Secondary | ICD-10-CM

## 2018-07-02 DIAGNOSIS — E538 Deficiency of other specified B group vitamins: Secondary | ICD-10-CM | POA: Diagnosis not present

## 2018-07-02 DIAGNOSIS — Z86010 Personal history of colonic polyps: Secondary | ICD-10-CM | POA: Diagnosis not present

## 2018-07-02 DIAGNOSIS — R5383 Other fatigue: Secondary | ICD-10-CM

## 2018-07-02 DIAGNOSIS — K219 Gastro-esophageal reflux disease without esophagitis: Secondary | ICD-10-CM | POA: Diagnosis not present

## 2018-07-02 DIAGNOSIS — Z1159 Encounter for screening for other viral diseases: Secondary | ICD-10-CM | POA: Diagnosis not present

## 2018-07-02 DIAGNOSIS — E7800 Pure hypercholesterolemia, unspecified: Secondary | ICD-10-CM | POA: Diagnosis not present

## 2018-07-02 DIAGNOSIS — G8929 Other chronic pain: Secondary | ICD-10-CM

## 2018-07-02 DIAGNOSIS — R739 Hyperglycemia, unspecified: Secondary | ICD-10-CM

## 2018-07-02 DIAGNOSIS — Z9189 Other specified personal risk factors, not elsewhere classified: Secondary | ICD-10-CM

## 2018-07-02 DIAGNOSIS — M810 Age-related osteoporosis without current pathological fracture: Secondary | ICD-10-CM

## 2018-07-02 DIAGNOSIS — E559 Vitamin D deficiency, unspecified: Secondary | ICD-10-CM | POA: Diagnosis not present

## 2018-07-02 DIAGNOSIS — G4733 Obstructive sleep apnea (adult) (pediatric): Secondary | ICD-10-CM

## 2018-07-02 DIAGNOSIS — M542 Cervicalgia: Secondary | ICD-10-CM | POA: Diagnosis not present

## 2018-07-02 DIAGNOSIS — J301 Allergic rhinitis due to pollen: Secondary | ICD-10-CM

## 2018-07-02 DIAGNOSIS — F325 Major depressive disorder, single episode, in full remission: Secondary | ICD-10-CM

## 2018-07-02 MED ORDER — SUCRALFATE 1 G PO TABS: 1.0000 g | ORAL_TABLET | Freq: Three times a day (TID) | ORAL | 0 refills | Status: DC

## 2018-07-02 MED ORDER — DEXLANSOPRAZOLE 60 MG PO CPDR: 60.0000 mg | DELAYED_RELEASE_CAPSULE | Freq: Every day | ORAL | 3 refills | Status: DC

## 2018-07-02 NOTE — Progress Notes (Signed)
Virtual Visit via Video   Due to the COVID-19 pandemic, this visit was completed with telemedicine (audio/video) technology to reduce patient and provider exposure as well as to preserve personal protective equipment.  I connected with West Carbo Petrosky by a video enabled telemedicine application and verified that I am speaking with the correct person using two identifiers. Location patient: Home Location provider: [facility], Office Persons participating in the virtual visit: Mckenzy, Salazar, DO   I discussed the limitations of evaluation and management by telemedicine and the availability of in person appointments. The patient expressed understanding and agreed to proceed.  Care Team   Patient Care Team: Briscoe Deutscher, DO as PCP - General (Family Medicine) Lady Gary, [facility] as Consulting Physician (Gynecology) Berle Mull, MD as Consulting Physician (Sports Medicine) Ladene Artist, MD as Consulting Physician (Gastroenterology)  Subjective:   HPI: Patient presents for NEW PATIENT appointment.   Chief Complaint  Patient presents with  . Establish Care  . Fatigue  . Pain in Joints  . Skin Itching  . Abdominal Pain  . Gastroesophageal Reflux    Has tried Omeprazole in the past with little relief, now taking Brand Name Prilosex OTC. Worse when eating late. Does note some chest pain.She will d/c the Evista now.   . Insomnia    Taking Zolpidem, now only taking 1/2 tablet at bedtime. As well as trazodone.   . Neck Pain    Taking Meloxicam with some relief.   . Depression    Taking Citalopram, feels sx are well controlled.   . IT band syndrome    Meloxicam prn, see's Raliegh Ip for inj, Dr. Alfonso Ramus.    Review of Systems  Constitutional: Positive for malaise/fatigue.  Cardiovascular: Positive for chest pain.  Gastrointestinal: Positive for abdominal pain and heartburn.  Musculoskeletal: Positive for neck pain.  Skin: Positive for  itching.  Psychiatric/Behavioral: Positive for depression.    Patient Active Problem List   Diagnosis Date Noted  . Dyspepsia 07/04/2018  . Personal history of colonic polyps, followed by Dr. Fuller Plan 07/04/2018  . IBS (irritable bowel syndrome) 07/04/2018  . Pure hypercholesterolemia 07/04/2018  . Chronic neck pain due to DJD, DDD, taking Meloxicam daily, unable to tolerate other NSAIDs 07/04/2018  . Vitamin D deficiency, on daily supplement 07/04/2018  . Insomnia, Rx prn 1/2 Ambien 07/04/2018  . Osteoporosis, previously on bisphosphonate, with gastritis/dyspepsia 07/04/2018  . Allergic rhinitis, Rx Nasocort 07/04/2018  . Depression, major, single episode, complete remission ([place]), Rx Celexa 07/04/2018  . OSA (obstructive sleep apnea), not on CPAP 07/04/2018    Social History   Tobacco Use  . Smoking status: Never Smoker  . Smokeless tobacco: Never Used  Substance Use Topics  . Alcohol use: Yes    Alcohol/week: 0.0 standard drinks    Comment: 1 per month    Current Outpatient Medications:  .  BIOTIN PO, Take 3,000 mcg by mouth., Disp: , Rfl:  .  Calcium Citrate-Vitamin D (CALCIUM + D PO), Take by mouth., Disp: , Rfl:  .  citalopram (CELEXA) 40 MG tablet, Take 20 mg by mouth daily. Alternates between 20mg  and 40 mg daily. , Disp: , Rfl:  .  Cyanocobalamin (VITAMIN B12 PO), Take by mouth., Disp: , Rfl:  .  meloxicam (MOBIC) 15 MG tablet, TAKE 1/2-1 TABLET EVERY DAY WITH FOOD FOR 14 DAYS THEN ONCE A DAY AS NEEDED(ALWAYS TAKE WITH FOOD), Disp: 90 tablet, Rfl: 3 .  omeprazole (PRILOSEC) 20 MG capsule,  Take 20 mg by mouth daily., Disp: , Rfl:  .  Probiotic Product (DIGESTIVE ADVANTAGE GUMMIES PO), Take by mouth., Disp: , Rfl:  .  raloxifene (EVISTA) 60 MG tablet, Take 60 mg by mouth daily., Disp: , Rfl:  .  triamcinolone (NASACORT) 55 MCG/ACT AERO nasal inhaler, Place 2 sprays into the nose daily., Disp: 1 Inhaler, Rfl: 0 .  zolpidem (AMBIEN) 10 MG tablet, Take 5 mg by mouth At  bedtime., Disp: , Rfl:   Allergies  Allergen Reactions  . Sulfonamide Derivatives   . Adhesive  [Tape] Rash  . Sulfa Antibiotics Rash    Objective:   VITALS: Per patient if applicable, see vitals. GENERAL: Alert, appears well and in no acute distress. HEENT: Atraumatic, conjunctiva clear, no obvious abnormalities on inspection of external nose and ears. NECK: Normal movements of the head and neck. CARDIOPULMONARY: No increased WOB. Speaking in clear sentences. I:E ratio WNL.  MS: Moves all visible extremities without noticeable abnormality. PSYCH: Pleasant and cooperative, well-groomed. Speech normal rate and rhythm. Affect is appropriate. Insight and judgement are appropriate. Attention is focused, linear, and appropriate.  NEURO: CN grossly intact. Oriented as arrived to appointment on time with no prompting. Moves both UE equally.  SKIN: No obvious lesions, wounds, erythema, or cyanosis noted on face or hands.  Depression screen PHQ 2/9 07/02/2018  Decreased Interest 1  Down, Depressed, Hopeless 0  PHQ - 2 Score 1  Altered sleeping 2  Tired, decreased energy 2  Change in appetite 2  Feeling bad or failure about yourself  2  Trouble concentrating 1  Moving slowly or fidgety/restless 1  Suicidal thoughts 0  PHQ-9 Score 11  Difficult doing work/chores Somewhat difficult    Assessment and Plan:   Taylor Hopkins was seen today for establish care, fatigue, pain in joints, skin itching, abdominal pain, gastroesophageal reflux, insomnia, neck pain, depression and it band syndrome.  Diagnoses and all orders for this visit:  Epigastric pain Comments: On bipshosphonate and NSAIDs. DC now. See orders.   Encounter for hepatitis C virus screening test for high risk patient -     Hepatitis C antibody; Future  Other fatigue -     TSH; Future -     Vitamin B12; Future -     Iron, TIBC and Ferritin Panel; Future  Gastroesophageal reflux disease, esophagitis presence not  specified  Vitamin D deficiency -     VITAMIN D 25 Hydroxy (Vit-D Deficiency, Fractures); Future  Neck pain, chronic Comments: DJD, DDD er patient. To PMR.  Orders: -     Ambulatory referral to Orthopedics  Hyperglycemia Comments: Endorses fasting BG  above 100.  Orders: -     Hemoglobin A1c; Future  B12 deficiency -     TSH; Future  Pure hypercholesterolemia -     Lipid panel; Future  Dyspepsia -     sucralfate (CARAFATE) 1 g tablet; Take 1 tablet (1 g total) by mouth 4 (four) times daily -  with meals and at bedtime. Dissolve in 2 tsp water to make slurry. -     Ambulatory referral to Gastroenterology -     CBC with Differential/Platelet; Future -     Comprehensive metabolic panel; Future -     dexlansoprazole (DEXILANT) 60 MG capsule; Take 1 capsule (60 mg total) by mouth daily.  Personal history of colonic polyps  Irritable bowel syndrome, unspecified type  Primary insomnia Comments: Stable. Continue current treatment.   Age related osteoporosis, unspecified pathological fracture presence Comments: Discussed  future Prolia.  Seasonal allergic rhinitis due to pollen Comments: On Nasocort. Need to clarify cataract status.   Depression, major, single episode, complete remission ([place]) Comments: Stable. Continue current treatment.   OSA (obstructive sleep apnea), not on CPAP    . COVID-19 Education: The signs and symptoms of COVID-19 were discussed with the patient and how to seek care for testing if needed. The importance of social distancing was discussed today. . Reviewed expectations re: course of current medical issues. . Discussed self-management of symptoms. . Outlined signs and symptoms indicating need for more acute intervention. . Patient verbalized understanding and all questions were answered. Marland Kitchen Health Maintenance issues including appropriate healthy diet, exercise, and smoking avoidance were discussed with patient. . See orders for this visit as  documented in the electronic medical record.  Briscoe Deutscher, DO  Records requested if needed. Time spent: 50 minutes, of which >50% was spent in obtaining information about her symptoms, reviewing her previous labs, evaluations, and treatments, counseling her about her condition (please see the discussed topics above), and developing a plan to further investigate it; she had a number of questions which I addressed.

## 2018-07-04 ENCOUNTER — Encounter: Payer: Self-pay | Admitting: Family Medicine

## 2018-07-04 DIAGNOSIS — E7800 Pure hypercholesterolemia, unspecified: Secondary | ICD-10-CM | POA: Insufficient documentation

## 2018-07-04 DIAGNOSIS — G4733 Obstructive sleep apnea (adult) (pediatric): Secondary | ICD-10-CM | POA: Insufficient documentation

## 2018-07-04 DIAGNOSIS — F325 Major depressive disorder, single episode, in full remission: Secondary | ICD-10-CM | POA: Insufficient documentation

## 2018-07-04 DIAGNOSIS — G8929 Other chronic pain: Secondary | ICD-10-CM | POA: Insufficient documentation

## 2018-07-04 DIAGNOSIS — F339 Major depressive disorder, recurrent, unspecified: Secondary | ICD-10-CM | POA: Insufficient documentation

## 2018-07-04 DIAGNOSIS — E559 Vitamin D deficiency, unspecified: Secondary | ICD-10-CM | POA: Insufficient documentation

## 2018-07-04 DIAGNOSIS — M810 Age-related osteoporosis without current pathological fracture: Secondary | ICD-10-CM | POA: Insufficient documentation

## 2018-07-04 DIAGNOSIS — J309 Allergic rhinitis, unspecified: Secondary | ICD-10-CM | POA: Insufficient documentation

## 2018-07-04 DIAGNOSIS — K589 Irritable bowel syndrome without diarrhea: Secondary | ICD-10-CM | POA: Insufficient documentation

## 2018-07-04 DIAGNOSIS — K635 Polyp of colon: Secondary | ICD-10-CM | POA: Insufficient documentation

## 2018-07-04 DIAGNOSIS — Z86010 Personal history of colonic polyps: Secondary | ICD-10-CM | POA: Insufficient documentation

## 2018-07-04 DIAGNOSIS — R1013 Epigastric pain: Secondary | ICD-10-CM | POA: Insufficient documentation

## 2018-07-04 DIAGNOSIS — G4700 Insomnia, unspecified: Secondary | ICD-10-CM | POA: Insufficient documentation

## 2018-07-05 ENCOUNTER — Telehealth: Payer: Self-pay

## 2018-07-05 ENCOUNTER — Other Ambulatory Visit (INDEPENDENT_AMBULATORY_CARE_PROVIDER_SITE_OTHER): Payer: Medicare Other

## 2018-07-05 ENCOUNTER — Other Ambulatory Visit: Payer: Self-pay

## 2018-07-05 ENCOUNTER — Telehealth: Payer: Self-pay | Admitting: Family Medicine

## 2018-07-05 DIAGNOSIS — E7800 Pure hypercholesterolemia, unspecified: Secondary | ICD-10-CM | POA: Diagnosis not present

## 2018-07-05 DIAGNOSIS — E559 Vitamin D deficiency, unspecified: Secondary | ICD-10-CM | POA: Diagnosis not present

## 2018-07-05 DIAGNOSIS — E538 Deficiency of other specified B group vitamins: Secondary | ICD-10-CM | POA: Diagnosis not present

## 2018-07-05 DIAGNOSIS — M545 Low back pain, unspecified: Secondary | ICD-10-CM

## 2018-07-05 DIAGNOSIS — R350 Frequency of micturition: Secondary | ICD-10-CM

## 2018-07-05 DIAGNOSIS — R739 Hyperglycemia, unspecified: Secondary | ICD-10-CM | POA: Diagnosis not present

## 2018-07-05 DIAGNOSIS — R1013 Epigastric pain: Secondary | ICD-10-CM | POA: Diagnosis not present

## 2018-07-05 DIAGNOSIS — R5383 Other fatigue: Secondary | ICD-10-CM

## 2018-07-05 DIAGNOSIS — Z1159 Encounter for screening for other viral diseases: Secondary | ICD-10-CM | POA: Diagnosis not present

## 2018-07-05 DIAGNOSIS — Z9189 Other specified personal risk factors, not elsewhere classified: Secondary | ICD-10-CM | POA: Diagnosis not present

## 2018-07-05 LAB — COMPREHENSIVE METABOLIC PANEL
ALT: 17 U/L (ref 0–35)
AST: 20 U/L (ref 0–37)
Albumin: 4 g/dL (ref 3.5–5.2)
Alkaline Phosphatase: 51 U/L (ref 39–117)
BUN: 16 mg/dL (ref 6–23)
CO2: 29 mEq/L (ref 19–32)
Calcium: 8.9 mg/dL (ref 8.4–10.5)
Chloride: 102 mEq/L (ref 96–112)
Creatinine, Ser: 0.72 mg/dL (ref 0.40–1.20)
GFR: 80.37 mL/min (ref >60.00)
Glucose, Bld: 107 mg/dL — ABNORMAL HIGH (ref 70–99)
Potassium: 3.3 mEq/L — ABNORMAL LOW (ref 3.5–5.1)
Sodium: 140 mEq/L (ref 135–145)
Total Bilirubin: 0.5 mg/dL (ref 0.2–1.2)
Total Protein: 6.4 g/dL (ref 6.0–8.3)

## 2018-07-05 LAB — URINALYSIS, ROUTINE W REFLEX MICROSCOPIC
Bilirubin Urine: NEGATIVE
Hgb urine dipstick: NEGATIVE
Ketones, ur: NEGATIVE
Leukocytes,Ua: NEGATIVE
Nitrite: NEGATIVE
Specific Gravity, Urine: 1.02 (ref 1.000–1.030)
Total Protein, Urine: NEGATIVE
Urine Glucose: NEGATIVE
Urobilinogen, UA: 0.2 (ref 0.0–1.0)
pH: 6.5 (ref 5.0–8.0)

## 2018-07-05 LAB — LIPID PANEL
Cholesterol: 139 mg/dL (ref 0–200)
HDL: 34 mg/dL — ABNORMAL LOW (ref >39.00)
NonHDL: 105.39
Total CHOL/HDL Ratio: 4
Triglycerides: 234 mg/dL — ABNORMAL HIGH (ref 0.0–149.0)
VLDL: 46.8 mg/dL — ABNORMAL HIGH (ref 0.0–40.0)

## 2018-07-05 LAB — CBC WITH DIFFERENTIAL/PLATELET
Basophils Absolute: 0 10*3/uL (ref 0.0–0.1)
Basophils Relative: 0.7 % (ref 0.0–3.0)
Eosinophils Absolute: 0 10*3/uL (ref 0.0–0.7)
Eosinophils Relative: 1 % (ref 0.0–5.0)
HCT: 40.5 % (ref 36.0–46.0)
Hemoglobin: 14.2 g/dL (ref 12.0–15.0)
Lymphocytes Relative: 24.1 % (ref 12.0–46.0)
Lymphs Abs: 1.2 10*3/uL (ref 0.7–4.0)
MCHC: 35.1 g/dL (ref 30.0–36.0)
MCV: 95.5 fl (ref 78.0–100.0)
Monocytes Absolute: 0.4 10*3/uL (ref 0.1–1.0)
Monocytes Relative: 8.5 % (ref 3.0–12.0)
Neutro Abs: 3.2 10*3/uL (ref 1.4–7.7)
Neutrophils Relative %: 65.7 % (ref 43.0–77.0)
Platelets: 145 10*3/uL — ABNORMAL LOW (ref 150.0–400.0)
RBC: 4.24 Mil/uL (ref 3.87–5.11)
RDW: 13.3 % (ref 11.5–15.5)
WBC: 4.8 10*3/uL (ref 4.0–10.5)

## 2018-07-05 LAB — VITAMIN B12: Vitamin B-12: 649 pg/mL (ref 211–911)

## 2018-07-05 LAB — TSH: TSH: 1.53 u[IU]/mL (ref 0.35–4.50)

## 2018-07-05 LAB — HEMOGLOBIN A1C: Hgb A1c MFr Bld: 6.1 % (ref 4.6–6.5)

## 2018-07-05 LAB — LDL CHOLESTEROL, DIRECT: Direct LDL: 68 mg/dL

## 2018-07-05 LAB — VITAMIN D 25 HYDROXY (VIT D DEFICIENCY, FRACTURES): VITD: 19.89 ng/mL — ABNORMAL LOW (ref 30.00–100.00)

## 2018-07-05 NOTE — Telephone Encounter (Signed)
Copied from [place] [id number]. Topic: Quick Communication - See Telephone Encounter >> Jul 05, 2018  2:51 PM Vernona Rieger wrote: CRM for notification. See Telephone encounter for: 07/05/18. Patient's insurance has sent over a form for a tier reduction for medication dexlansoprazole (DEXILANT) 60 MG capsule. She said that she picked up the medication Friday and it was $299.00. She said she can not afford this for 30 days. She also said she prefers it to be 90 day supply with mail order so that it may be cheaper.

## 2018-07-05 NOTE — Telephone Encounter (Signed)
See note

## 2018-07-05 NOTE — Telephone Encounter (Signed)
Noted, will await form

## 2018-07-05 NOTE — Telephone Encounter (Signed)
Dr. Juleen China would like to start patient on Prolia. Do you need anything from me?

## 2018-07-05 NOTE — Progress Notes (Deleted)
Virtual Visit via Video   Due to the COVID-19 pandemic, this visit was completed with telemedicine (audio/video) technology to reduce patient and provider exposure as well as to preserve personal protective equipment.   I connected with [place] Carbo Cuen by a video enabled telemedicine application and verified that I am speaking with the correct person using two identifiers. Location patient: Home Location provider: [facility], Office Persons participating in the virtual visit: Eulalah, Rupert, CMA   I discussed the limitations of evaluation and management by telemedicine and the availability of in person appointments. The patient expressed understanding and agreed to proceed.  Care Team   Patient Care Team: Briscoe Deutscher, DO as PCP - General (Family Medicine) Lady Gary, [facility] as Consulting Physician (Gynecology) Berle Mull, MD as Consulting Physician (Sports Medicine) Ladene Artist, MD as Consulting Physician (Gastroenterology)  Subjective:   HPI:   ROS   Patient Active Problem List   Diagnosis Date Noted  . Dyspepsia 07/04/2018  . Personal history of colonic polyps, followed by Dr. Fuller Plan 07/04/2018  . IBS (irritable bowel syndrome) 07/04/2018  . Pure hypercholesterolemia 07/04/2018  . Chronic neck pain due to DJD, DDD, taking Meloxicam daily, unable to tolerate other NSAIDs 07/04/2018  . Vitamin D deficiency, on daily supplement 07/04/2018  . Insomnia, Rx prn 1/2 Ambien 07/04/2018  . Osteoporosis, previously on bisphosphonate, with gastritis/dyspepsia 07/04/2018  . Allergic rhinitis, Rx Nasocort 07/04/2018  . Depression, major, single episode, complete remission ([place]), Rx Celexa 07/04/2018  . OSA (obstructive sleep apnea), not on CPAP 07/04/2018    Social History   Tobacco Use  . Smoking status: Never Smoker  . Smokeless tobacco: Never Used  Substance Use Topics  . Alcohol use: Yes    Alcohol/week: 0.0 standard  drinks    Comment: 1 per month    Current Outpatient Medications:  .  BIOTIN PO, Take 3,000 mcg by mouth., Disp: , Rfl:  .  Calcium Citrate-Vitamin D (CALCIUM + D PO), Take by mouth., Disp: , Rfl:  .  citalopram (CELEXA) 40 MG tablet, Take 20 mg by mouth daily. Alternates between 20mg  and 40 mg daily. , Disp: , Rfl:  .  Cyanocobalamin (VITAMIN B12 PO), Take by mouth., Disp: , Rfl:  .  dexlansoprazole (DEXILANT) 60 MG capsule, Take 1 capsule (60 mg total) by mouth daily., Disp: 30 capsule, Rfl: 3 .  meloxicam (MOBIC) 15 MG tablet, TAKE 1/2-1 TABLET EVERY DAY WITH FOOD FOR 14 DAYS THEN ONCE A DAY AS NEEDED(ALWAYS TAKE WITH FOOD), Disp: 90 tablet, Rfl: 3 .  omeprazole (PRILOSEC) 20 MG capsule, Take 20 mg by mouth daily., Disp: , Rfl:  .  Probiotic Product (DIGESTIVE ADVANTAGE GUMMIES PO), Take by mouth., Disp: , Rfl:  .  sucralfate (CARAFATE) 1 g tablet, Take 1 tablet (1 g total) by mouth 4 (four) times daily -  with meals and at bedtime. Dissolve in 2 tsp water to make slurry., Disp: 120 tablet, Rfl: 0 .  triamcinolone (NASACORT) 55 MCG/ACT AERO nasal inhaler, Place 2 sprays into the nose daily., Disp: 1 Inhaler, Rfl: 0 .  zolpidem (AMBIEN) 10 MG tablet, Take 5 mg by mouth At bedtime., Disp: , Rfl:   Current Facility-Administered Medications:  .  0.9 %  sodium chloride infusion, 500 mL, Intravenous, Continuous, Ladene Artist, MD  Allergies  Allergen Reactions  . Sulfonamide Derivatives   . Adhesive  [Tape] Rash  . Sulfa Antibiotics Rash    Objective:  VITALS: Per patient if applicable, see vitals. GENERAL: Alert, appears well and in no acute distress. HEENT: Atraumatic, conjunctiva clear, no obvious abnormalities on inspection of external nose and ears. NECK: Normal movements of the head and neck. CARDIOPULMONARY: No increased WOB. Speaking in clear sentences. I:E ratio WNL.  MS: Moves all visible extremities without noticeable abnormality. PSYCH: Pleasant and cooperative,  well-groomed. Speech normal rate and rhythm. Affect is appropriate. Insight and judgement are appropriate. Attention is focused, linear, and appropriate.  NEURO: CN grossly intact. Oriented as arrived to appointment on time with no prompting. Moves both UE equally.  SKIN: No obvious lesions, wounds, erythema, or cyanosis noted on face or hands.  Depression screen PHQ 2/9 07/02/2018  Decreased Interest 1  Down, Depressed, Hopeless 0  PHQ - 2 Score 1  Altered sleeping 2  Tired, decreased energy 2  Change in appetite 2  Feeling bad or failure about yourself  2  Trouble concentrating 1  Moving slowly or fidgety/restless 1  Suicidal thoughts 0  PHQ-9 Score 11  Difficult doing work/chores Somewhat difficult    Assessment and Plan:   There are no diagnoses linked to this encounter.  Marland Kitchen COVID-19 Education: The signs and symptoms of COVID-19 were discussed with the patient and how to seek care for testing if needed. The importance of social distancing was discussed today. . Reviewed expectations re: course of current medical issues. . Discussed self-management of symptoms. . Outlined signs and symptoms indicating need for more acute intervention. . Patient verbalized understanding and all questions were answered. Marland Kitchen Health Maintenance issues including appropriate healthy diet, exercise, and smoking avoidance were discussed with patient. . See orders for this visit as documented in the electronic medical record.  Francella Solian, CMA  Records requested if needed. Time spent: *** minutes, of which >50% was spent in obtaining information about her symptoms, reviewing her previous labs, evaluations, and treatments, counseling her about her condition (please see the discussed topics above), and developing a plan to further investigate it; she had a number of questions which I addressed.

## 2018-07-05 NOTE — Addendum Note (Signed)
Addended by: Francis Dowse T on: 07/05/2018 10:25 AM   Modules accepted: Orders

## 2018-07-06 LAB — URINE CULTURE
MICRO NUMBER:: 462497
SPECIMEN QUALITY:: ADEQUATE

## 2018-07-06 LAB — IRON,TIBC AND FERRITIN PANEL
%SAT: 30 % (calc) (ref 16–45)
Ferritin: 58 ng/mL (ref 16–288)
Iron: 104 ug/dL (ref 45–160)
TIBC: 351 mcg/dL (calc) (ref 250–450)

## 2018-07-06 LAB — HEPATITIS C ANTIBODY
Hepatitis C Ab: NONREACTIVE
SIGNAL TO CUT-OFF: 0.15 (ref <1.00)

## 2018-07-06 NOTE — Telephone Encounter (Signed)
Nothing needed.  I will start the insurance verification process.  Thanks!

## 2018-07-07 ENCOUNTER — Telehealth: Payer: Self-pay

## 2018-07-07 ENCOUNTER — Other Ambulatory Visit: Payer: Self-pay

## 2018-07-07 NOTE — Progress Notes (Unsigned)
Virtual Visit via Video   Due to the COVID-19 pandemic, this visit was completed with telemedicine (audio/video) technology to reduce patient and provider exposure as well as to preserve personal protective equipment.   I connected with Taylor Hopkins by a video enabled telemedicine application and verified that I am speaking with the correct person using two identifiers. Location patient: Home Location provider: [facility], Office Persons participating in the virtual visit: Taylor, Hopkins, CMA   I discussed the limitations of evaluation and management by telemedicine and the availability of in person appointments. The patient expressed understanding and agreed to proceed.  Care Team   Patient Care Team: Taylor Deutscher, DO as PCP - General (Family Medicine) Taylor Hopkins, [facility] as Consulting Physician (Gynecology) Taylor Mull, MD as Consulting Physician (Sports Medicine) Taylor Artist, MD as Consulting Physician (Gastroenterology)  Subjective:   HPI:   ROS   Patient Active Problem List   Diagnosis Date Noted  . Dyspepsia 07/04/2018  . Personal history of colonic polyps, followed by Dr. Fuller Hopkins 07/04/2018  . IBS (irritable bowel syndrome) 07/04/2018  . Pure hypercholesterolemia 07/04/2018  . Chronic neck pain due to DJD, DDD, taking Meloxicam daily, unable to tolerate other NSAIDs 07/04/2018  . Vitamin D deficiency, on daily supplement 07/04/2018  . Insomnia, Rx prn 1/2 Ambien 07/04/2018  . Osteoporosis, previously on bisphosphonate, with gastritis/dyspepsia 07/04/2018  . Allergic rhinitis, Rx Nasocort 07/04/2018  . Depression, major, single episode, complete remission ([name]), Rx Celexa 07/04/2018  . OSA (obstructive sleep apnea), not on CPAP 07/04/2018    Social History   Tobacco Use  . Smoking status: Never Smoker  . Smokeless tobacco: Never Used  Substance Use Topics  . Alcohol use: Yes    Alcohol/week: 0.0 standard  drinks    Comment: 1 per month    Current Outpatient Medications:  .  BIOTIN PO, Take 3,000 mcg by mouth., Disp: , Rfl:  .  Calcium Citrate-Vitamin D (CALCIUM + D PO), Take by mouth., Disp: , Rfl:  .  citalopram (CELEXA) 40 MG tablet, Take 20 mg by mouth daily. Alternates between 20mg  and 40 mg daily. , Disp: , Rfl:  .  Cyanocobalamin (VITAMIN B12 PO), Take by mouth., Disp: , Rfl:  .  dexlansoprazole (DEXILANT) 60 MG capsule, Take 1 capsule (60 mg total) by mouth daily., Disp: 30 capsule, Rfl: 3 .  meloxicam (MOBIC) 15 MG tablet, TAKE 1/2-1 TABLET EVERY DAY WITH FOOD FOR 14 DAYS THEN ONCE A DAY AS NEEDED(ALWAYS TAKE WITH FOOD), Disp: 90 tablet, Rfl: 3 .  omeprazole (PRILOSEC) 20 MG capsule, Take 20 mg by mouth daily., Disp: , Rfl:  .  Probiotic Product (DIGESTIVE ADVANTAGE GUMMIES PO), Take by mouth., Disp: , Rfl:  .  sucralfate (CARAFATE) 1 g tablet, Take 1 tablet (1 g total) by mouth 4 (four) times daily -  with meals and at bedtime. Dissolve in 2 tsp water to make slurry., Disp: 120 tablet, Rfl: 0 .  triamcinolone (NASACORT) 55 MCG/ACT AERO nasal inhaler, Place 2 sprays into the nose daily., Disp: 1 Inhaler, Rfl: 0 .  zolpidem (AMBIEN) 10 MG tablet, Take 5 mg by mouth At bedtime., Disp: , Rfl:   Current Facility-Administered Medications:  .  0.9 %  sodium chloride infusion, 500 mL, Intravenous, Continuous, Taylor Artist, MD  Allergies  Allergen Reactions  . Sulfonamide Derivatives   . Adhesive  [Tape] Rash  . Sulfa Antibiotics Rash    Objective:  VITALS: Per patient if applicable, see vitals. GENERAL: Alert, appears well and in no acute distress. HEENT: Atraumatic, conjunctiva clear, no obvious abnormalities on inspection of external nose and ears. NECK: Normal movements of the head and neck. CARDIOPULMONARY: No increased WOB. Speaking in clear sentences. I:E ratio WNL.  MS: Moves all visible extremities without noticeable abnormality. PSYCH: Pleasant and cooperative,  well-groomed. Speech normal rate and rhythm. Affect is appropriate. Insight and judgement are appropriate. Attention is focused, linear, and appropriate.  NEURO: CN grossly intact. Oriented as arrived to appointment on time with no prompting. Moves both UE equally.  SKIN: No obvious lesions, wounds, erythema, or cyanosis noted on face or hands.  Depression screen PHQ 2/9 07/02/2018  Decreased Interest 1  Down, Depressed, Hopeless 0  PHQ - 2 Score 1  Altered sleeping 2  Tired, decreased energy 2  Change in appetite 2  Feeling bad or failure about yourself  2  Trouble concentrating 1  Moving slowly or fidgety/restless 1  Suicidal thoughts 0  PHQ-9 Score 11  Difficult doing work/chores Somewhat difficult    Assessment and Hopkins:   There are no diagnoses linked to this encounter.  Marland Kitchen COVID-19 Education: The signs and symptoms of COVID-19 were discussed with the patient and how to seek care for testing if needed. The importance of social distancing was discussed today. . Reviewed expectations re: course of current medical issues. . Discussed self-management of symptoms. . Outlined signs and symptoms indicating need for more acute intervention. . Patient verbalized understanding and all questions were answered. Marland Kitchen Health Maintenance issues including appropriate healthy diet, exercise, and smoking avoidance were discussed with patient. . See orders for this visit as documented in the electronic medical record.  Francella Solian, CMA  Records requested if needed. Time spent: *** minutes, of which >50% was spent in obtaining information about her symptoms, reviewing her previous labs, evaluations, and treatments, counseling her about her condition (please see the discussed topics above), and developing a Hopkins to further investigate it; she had a number of questions which I addressed.

## 2018-07-07 NOTE — Telephone Encounter (Signed)
Copied from [place] [id number]. Topic: Quick Communication - Lab Results (Clinic Use ONLY) >> Jul 07, 2018  9:24 AM Scherrie Gerlach wrote: Pt would like results of labs please

## 2018-07-07 NOTE — Telephone Encounter (Signed)
Pt wants to know if you have received form and has it been submitted?

## 2018-07-09 NOTE — Telephone Encounter (Signed)
Called and spoke with patient, advised that form has still not been received. Provided fax number, pt will contact her insurance company to have form faxed again.

## 2018-07-13 ENCOUNTER — Encounter: Payer: Self-pay | Admitting: General Surgery

## 2018-07-14 ENCOUNTER — Other Ambulatory Visit: Payer: Self-pay

## 2018-07-14 ENCOUNTER — Other Ambulatory Visit: Payer: Self-pay | Admitting: *Deleted

## 2018-07-14 ENCOUNTER — Encounter: Payer: Self-pay | Admitting: Gastroenterology

## 2018-07-14 ENCOUNTER — Ambulatory Visit (INDEPENDENT_AMBULATORY_CARE_PROVIDER_SITE_OTHER): Payer: Medicare Other | Admitting: Gastroenterology

## 2018-07-14 VITALS — Ht 66.75 in | Wt 215.0 lb

## 2018-07-14 DIAGNOSIS — K219 Gastro-esophageal reflux disease without esophagitis: Secondary | ICD-10-CM | POA: Diagnosis not present

## 2018-07-14 DIAGNOSIS — M542 Cervicalgia: Secondary | ICD-10-CM

## 2018-07-14 DIAGNOSIS — R1310 Dysphagia, unspecified: Secondary | ICD-10-CM | POA: Diagnosis not present

## 2018-07-14 DIAGNOSIS — R1013 Epigastric pain: Secondary | ICD-10-CM | POA: Diagnosis not present

## 2018-07-14 DIAGNOSIS — R1319 Other dysphagia: Secondary | ICD-10-CM

## 2018-07-14 NOTE — Patient Instructions (Addendum)
Continue Dexilant daily and Carafate 4 times daily.   You have been scheduled for an endoscopy. Please follow written instructions given to you at your visit today. If you use inhalers (even only as needed), please bring them with you on the day of your procedure. Please bring acknowledgment paper with you to your appointment.   Thank you for choosing me and [facility].  Pricilla Riffle. Dagoberto Ligas., MD., Marval Regal

## 2018-07-14 NOTE — Progress Notes (Signed)
History of Present Illness: This is a 68 year old female who relates history of worsening solid food dysphagia, reflux symptoms and epigastric pain over the past several weeks.  She was evaluated by her PCP and relates that meloxicam and raloxifene were discontinued.  Dexilant was started in place of omeprazole.  Carafate was added.  All symptoms have improved except for her solid food dysphagia.  She has a history of an esophageal stricture last dilated in 2018. Denies weight loss, constipation, diarrhea, change in stool caliber, melena, hematochezia, nausea, vomiting, chest pain.   Allergies  Allergen Reactions  . Sulfonamide Derivatives   . Adhesive  [Tape] Rash  . Sulfa Antibiotics Rash   Outpatient Medications Prior to Visit  Medication Sig Dispense Refill  . BIOTIN PO Take 3,000 mcg by mouth.    . Calcium Citrate-Vitamin D (CALCIUM + D PO) Take by mouth.    . citalopram (CELEXA) 40 MG tablet Take 20 mg by mouth daily. Alternates between 20mg  and 40 mg daily.     . Cyanocobalamin (VITAMIN B12 PO) Take by mouth.    . dexlansoprazole (DEXILANT) 60 MG capsule Take 1 capsule (60 mg total) by mouth daily. 30 capsule 3  . meloxicam (MOBIC) 15 MG tablet TAKE 1/2-1 TABLET EVERY DAY WITH FOOD FOR 14 DAYS THEN ONCE A DAY AS NEEDED(ALWAYS TAKE WITH FOOD) 90 tablet 3  . omeprazole (PRILOSEC) 20 MG capsule Take 20 mg by mouth daily.    . Probiotic Product (DIGESTIVE ADVANTAGE GUMMIES PO) Take by mouth.    . sucralfate (CARAFATE) 1 g tablet Take 1 tablet (1 g total) by mouth 4 (four) times daily -  with meals and at bedtime. Dissolve in 2 tsp water to make slurry. 120 tablet 0  . triamcinolone (NASACORT) 55 MCG/ACT AERO nasal inhaler Place 2 sprays into the nose daily. 1 Inhaler 0  . zolpidem (AMBIEN) 10 MG tablet Take 5 mg by mouth At bedtime.     Facility-Administered Medications Prior to Visit  Medication Dose Route Frequency Provider Last Rate Last Dose  . 0.9 %  sodium chloride infusion   500 mL Intravenous Continuous Ladene Artist, MD       Past Medical History:  Diagnosis Date  . Adenomatous colon polyp 06/2003  . Allergic rhinitis   . Anxiety   . Arthritis   . Cataract 2010   bilateral eyes  . Depression   . GERD (gastroesophageal reflux disease)   . Sleep apnea    Not currently using CPAP - last sleep study 2016   Past Surgical History:  Procedure Laterality Date  . ANKLE SURGERY Right   . BREAST ENHANCEMENT SURGERY    . LUMBAR DISC SURGERY    . TONSILLECTOMY    . TOTAL ABDOMINAL HYSTERECTOMY     Social History   Socioeconomic History  . Marital status: Married    Spouse name: Coralyn Mark  . Number of children: 2  . Years of education: Not on file  . Highest education level: Not on file  Occupational History    Employer: RETIRED    Comment: [employer]  . Financial resource strain: Not on file  . Food insecurity:    Worry: Not on file    Inability: Not on file  . Transportation needs:    Medical: Not on file    Non-medical: Not on file  Tobacco Use  . Smoking status: Never Smoker  . Smokeless tobacco: Never Used  Substance and Sexual Activity  .  Alcohol use: Yes    Alcohol/week: 0.0 standard drinks    Comment: 1 per month  . Drug use: No  . Sexual activity: Yes    Birth control/protection: Post-menopausal  Lifestyle  . Physical activity:    Days per week: 3 days    Minutes per session: 30 min  . Stress: To some extent  Relationships  . Social connections:    Talks on phone: Not on file    Gets together: Not on file    Attends religious service: Not on file    Active member of club or organization: Not on file    Attends meetings of clubs or organizations: Not on file    Relationship status: Not on file  Other Topics Concern  . Not on file  Social History Narrative  . Not on file   Family History  Problem Relation Age of Onset  . Allergies Brother   . Heart attack Father   . Colon polyps Father   . Diverticulitis  Father   . Heart failure Father   . Esophageal cancer Maternal Grandmother   . Heart failure Mother   . Mitral valve prolapse Mother   . Diabetes Mother   . Kidney failure Mother   . Stomach cancer Paternal Uncle   . Colon cancer Neg Hx        Physical Exam: Telemedicine visit   Assessment and Recommendations:  1.  GERD, solid food dysphagia, epigastric pain.  Suspected biphosphonate and/or NSAID side effects and these were both discontinued.  Rule out esophageal stricture, esophagitis, gastritis, ulcer.  Continue Dexilant 60 mg p.o. every morning and Carafate 4 times daily as needed.  Schedule EGD with possible dilation.  The risks (including bleeding, perforation, infection, missed lesions, medication reactions and possible hospitalization or surgery if complications occur), benefits, and alternatives to endoscopy with possible biopsy and possible dilation were discussed with the patient and they consent to proceed.     These services were provided via telemedicine, audio and visual.  The patient was at home and the provider was in the office, alone.  We discussed the limitations of evaluation and management by telemedicine and the availability of in person appointments.  Patient consented for this telemedicine visit and is aware of possible charges for this service.  The other person participating in the telemedicine service was Marlon Pel, [place] who reviewed medications, allergies, past history and completed AVS.  Time spent on call: 8 minutes

## 2018-07-15 ENCOUNTER — Encounter: Payer: Self-pay | Admitting: Gastroenterology

## 2018-07-15 ENCOUNTER — Other Ambulatory Visit: Payer: Self-pay

## 2018-07-15 ENCOUNTER — Ambulatory Visit (AMBULATORY_SURGERY_CENTER): Payer: Medicare Other | Admitting: Gastroenterology

## 2018-07-15 VITALS — BP 157/70 | HR 74 | Temp 98.7°F | Resp 15 | Ht 66.75 in | Wt 215.0 lb

## 2018-07-15 DIAGNOSIS — G4733 Obstructive sleep apnea (adult) (pediatric): Secondary | ICD-10-CM | POA: Diagnosis not present

## 2018-07-15 DIAGNOSIS — K2970 Gastritis, unspecified, without bleeding: Secondary | ICD-10-CM

## 2018-07-15 DIAGNOSIS — K219 Gastro-esophageal reflux disease without esophagitis: Secondary | ICD-10-CM

## 2018-07-15 DIAGNOSIS — K222 Esophageal obstruction: Secondary | ICD-10-CM | POA: Diagnosis not present

## 2018-07-15 DIAGNOSIS — K3189 Other diseases of stomach and duodenum: Secondary | ICD-10-CM

## 2018-07-15 DIAGNOSIS — R1310 Dysphagia, unspecified: Secondary | ICD-10-CM

## 2018-07-15 DIAGNOSIS — R1319 Other dysphagia: Secondary | ICD-10-CM

## 2018-07-15 MED ORDER — SODIUM CHLORIDE 0.9 % IV SOLN: 500.0000 mL | Freq: Once | INTRAVENOUS | Status: DC

## 2018-07-15 NOTE — Progress Notes (Signed)
Called to room to assist during endoscopic procedure.  Patient ID and intended procedure confirmed with present staff. Received instructions for my participation in the procedure from the performing physician.  

## 2018-07-15 NOTE — Progress Notes (Signed)
Report given to PACU, vss 

## 2018-07-15 NOTE — Progress Notes (Signed)
CW - temp JB- vital signs

## 2018-07-15 NOTE — Patient Instructions (Signed)
YOU HAD AN ENDOSCOPIC PROCEDURE TODAY AT [facility]:   Refer to the procedure report that was given to you for any specific questions about what was found during the examination.  If the procedure report does not answer your questions, please call your gastroenterologist to clarify.  If you requested that your care partner not be given the details of your procedure findings, then the procedure report has been included in a sealed envelope for you to review at your convenience later.  YOU SHOULD EXPECT: Some feelings of bloating in the abdomen. Passage of more gas than usual.  Walking can help get rid of the air that was put into your GI tract during the procedure and reduce the bloating. If you had a lower endoscopy (such as a colonoscopy or flexible sigmoidoscopy) you may notice spotting of blood in your stool or on the toilet paper. If you underwent a bowel prep for your procedure, you may not have a normal bowel movement for a few days.  Please Note:  You might notice some irritation and congestion in your nose or some drainage.  This is from the oxygen used during your procedure.  There is no need for concern and it should clear up in a day or so.  SYMPTOMS TO REPORT IMMEDIATELY:    Following upper endoscopy (EGD)  Vomiting of blood or coffee ground material  New chest pain or pain under the shoulder blades  Painful or persistently difficult swallowing  New shortness of breath  Fever of 100F or higher  Black, tarry-looking stools  For urgent or emergent issues, a gastroenterologist can be reached at any hour by calling [phone].   DIET:  We do recommend a small meal at first, but then you may proceed to your regular diet.  Drink plenty of fluids but you should avoid alcoholic beverages for 24 hours.  ACTIVITY:  You should plan to take it easy for the rest of today and you should NOT DRIVE or use heavy machinery until tomorrow (because of the sedation medicines used  during the test).    FOLLOW UP: Our staff will call the number listed on your records 48-72 hours following your procedure to check on you and address any questions or concerns that you may have regarding the information given to you following your procedure. If we do not reach you, we will leave a message.  We will attempt to reach you two times.  During this call, we will ask if you have developed any symptoms of COVID 19. If you develop any symptoms (for example fever, flu-like symptoms, shortness of breath, cough etc.) before then, please call [phone].  If any biopsies were taken you will be contacted by phone or by letter within the next 1-3 weeks.  Please call us at [phone] if you have not heard about the biopsies in 3 weeks.    SIGNATURES/CONFIDENTIALITY: You and/or your care partner have signed paperwork which will be entered into your electronic medical record.  These signatures attest to the fact that that the information above on your After Visit Summary has been reviewed and is understood.  Full responsibility of the confidentiality of this discharge information lies with you and/or your care-partner.  Thank you for allowing [place] to provide your healthcare today.

## 2018-07-15 NOTE — Op Note (Signed)
[facility] Patient Name: Leonilda Cozby Procedure Date: 07/15/2018 8:14 AM MRN: [id number] Endoscopist: Ladene Artist , MD Age: 68 Referring MD:  Date of Birth: [birth year] Gender: Female Account #: [id number] Procedure:                Upper GI endoscopy Indications:              Dysphagia, Gastroesophageal reflux disease Medicines:                Monitored Anesthesia Care Procedure:                Pre-Anesthesia Assessment:                           - Prior to the procedure, a History and Physical                            was performed, and patient medications and                            allergies were reviewed. The patient's tolerance of                            previous anesthesia was also reviewed. The risks                            and benefits of the procedure and the sedation                            options and risks were discussed with the patient.                            All questions were answered, and informed consent                            was obtained. Prior Anticoagulants: The patient has                            taken no previous anticoagulant or antiplatelet                            agents. ASA Grade Assessment: II - A patient with                            mild systemic disease. After reviewing the risks                            and benefits, the patient was deemed in                            satisfactory condition to undergo the procedure.                           After obtaining informed consent, the endoscope was  passed under direct vision. Throughout the                            procedure, the patient's blood pressure, pulse, and                            oxygen saturations were monitored continuously. The                            Model GIF-[id number]) scope was introduced                            through the mouth, and advanced to the second part                            of  duodenum. The upper GI endoscopy was                            accomplished without difficulty. The patient                            tolerated the procedure well. Scope In: Scope Out: Findings:                 One benign-appearing, intrinsic moderate stenosis                            was found 36 cm from the incisors. This stenosis                            measured 1.4 cm (inner diameter). The stenosis was                            traversed. A guidewire was placed and the scope was                            withdrawn. Dilations were performed with Savary                            dilators with no resistance at 14 mm. Dilations                            were performed with Savary dilators with mild                            resistance at 15 mm, 16 mm and 17 mm.                           The exam of the esophagus was otherwise normal.                           Patchy mildly erythematous mucosa without bleeding  was found in the gastric fundus and in the gastric                            body. Biopsies were taken with a cold forceps for                            histology.                           A small hiatal hernia was present.                           The exam of the stomach was otherwise normal.                           The duodenal bulb and second portion of the                            duodenum were normal. Complications:            No immediate complications. Estimated Blood Loss:     Estimated blood loss was minimal. Impression:               - Benign-appearing esophageal stenosis. Dilated.                           - Erythematous mucosa in the gastric fundus and                            gastric body. Biopsied.                           - Small hiatal hernia.                           - Normal duodenal bulb and second portion of the                            duodenum. Recommendation:           - Patient has a contact number  available for                            emergencies. The signs and symptoms of potential                            delayed complications were discussed with the                            patient. Return to normal activities tomorrow.                            Written discharge instructions were provided to the                            patient.                           -  Clear liquid diet for 2 hours, then advance as                            tolerated to soft diet today.                           - Resume prior diet tomorrow.                           - Antireflux measures long term.                           - Continue present medications.                           - Await pathology results.                           - Return to GI office in 6 weeks. Ladene Artist, MD 07/15/2018 8:34:05 AM This report has been signed electronically.

## 2018-07-16 NOTE — Telephone Encounter (Signed)
Form still not received. Called pharmacy to get BIN [phone]), PCN ([id number]), RxGRP [phone]), and ID# ([id number]).   PA initated via [email or web address]  Your PA has been faxed to the plan as a paper copy. Please contact the plan directly if you haven't received a determination in a typical timeframe.  You will be notified of the determination via fax.

## 2018-07-16 NOTE — Telephone Encounter (Signed)
[employer] verification complete.  Patient can get Prolia injection anytime.  There is Prolia injection labeled with name and DOB in [place].  Patient's insurance completely covers injection.  $0 out of pocket.

## 2018-07-17 ENCOUNTER — Telehealth: Payer: Self-pay | Admitting: *Deleted

## 2018-07-17 NOTE — Telephone Encounter (Signed)
Have you called patient and let her know or do I need to?

## 2018-07-17 NOTE — Telephone Encounter (Signed)
1. Have you developed a fever since your procedure? no  2.   Have you had an respiratory symptoms (SOB or cough) since your procedure? no  3.   Have you tested positive for COVID 19 since your procedure no  4.   Have you had any family members/close contacts diagnosed with the COVID 19 since your procedure?  no   If any of these questions are a yes, please inquire if patient has been seen by family doctor and route this note to Joylene John, Therapist, sports.    Follow up Call-  Call back number 07/15/2018 07/16/2016  Post procedure Call Back phone  # [phone]  Permission to leave phone message Yes Yes  Some recent data might be hidden     Patient questions:  Do you have a fever, pain , or abdominal swelling? No. Pain Score  0 *  Have you tolerated food without any problems? Yes.    Have you been able to return to your normal activities? Yes.    Do you have any questions about your discharge instructions: Diet   No. Medications  No. Follow up visit  no  Do you have questions or concerns about your Care? No.  Actions: * If pain score is 4 or above: No action needed, pain <4.

## 2018-07-19 NOTE — Progress Notes (Deleted)
Virtual Visit via Video   Due to the COVID-19 pandemic, this visit was completed with telemedicine (audio/video) technology to reduce patient and provider exposure as well as to preserve personal protective equipment.   I connected with Taylor Hopkins by a video enabled telemedicine application and verified that I am speaking with the correct person using two identifiers. Location patient: Home Location provider: [facility], Office Persons participating in the virtual visit: Shanetha, Bradham, DO   I discussed the limitations of evaluation and management by telemedicine and the availability of in person appointments. The patient expressed understanding and agreed to proceed.  Care Team   Patient Care Team: Briscoe Deutscher, DO as PCP - General (Family Medicine) Lady Gary, [facility] as Consulting Physician (Gynecology) Berle Mull, MD as Consulting Physician (Sports Medicine) Ladene Artist, MD as Consulting Physician (Gastroenterology)  Subjective:   HPI:   ROS   Patient Active Problem List   Diagnosis Date Noted  . Dyspepsia 07/04/2018  . Personal history of colonic polyps, followed by Dr. Fuller Plan 07/04/2018  . IBS (irritable bowel syndrome) 07/04/2018  . Pure hypercholesterolemia 07/04/2018  . Chronic neck pain due to DJD, DDD, taking Meloxicam daily, unable to tolerate other NSAIDs 07/04/2018  . Vitamin D deficiency, on daily supplement 07/04/2018  . Insomnia, Rx prn 1/2 Ambien 07/04/2018  . Osteoporosis, previously on bisphosphonate, with gastritis/dyspepsia 07/04/2018  . Allergic rhinitis, Rx Nasocort 07/04/2018  . Depression, major, single episode, complete remission ([place]), Rx Celexa 07/04/2018  . OSA (obstructive sleep apnea), not on CPAP 07/04/2018    Social History   Tobacco Use  . Smoking status: Never Smoker  . Smokeless tobacco: Never Used  Substance Use Topics  . Alcohol use: Yes    Alcohol/week: 0.0 standard drinks   Comment: 1 per month    Current Outpatient Medications:  .  BIOTIN PO, Take 3,000 mcg by mouth., Disp: , Rfl:  .  Calcium Citrate-Vitamin D (CALCIUM + D PO), Take by mouth., Disp: , Rfl:  .  citalopram (CELEXA) 40 MG tablet, Take 20 mg by mouth daily. Alternates between 20mg  and 40 mg daily. , Disp: , Rfl:  .  Cyanocobalamin (VITAMIN B12 PO), Take by mouth., Disp: , Rfl:  .  dexlansoprazole (DEXILANT) 60 MG capsule, Take 1 capsule (60 mg total) by mouth daily., Disp: 30 capsule, Rfl: 3 .  omeprazole (PRILOSEC) 20 MG capsule, Take 20 mg by mouth daily., Disp: , Rfl:  .  Probiotic Product (DIGESTIVE ADVANTAGE GUMMIES PO), Take by mouth., Disp: , Rfl:  .  sucralfate (CARAFATE) 1 g tablet, Take 1 tablet (1 g total) by mouth 4 (four) times daily -  with meals and at bedtime. Dissolve in 2 tsp water to make slurry., Disp: 120 tablet, Rfl: 0 .  triamcinolone (NASACORT) 55 MCG/ACT AERO nasal inhaler, Place 2 sprays into the nose daily., Disp: 1 Inhaler, Rfl: 0 .  zolpidem (AMBIEN) 10 MG tablet, Take 5 mg by mouth At bedtime., Disp: , Rfl:   Allergies  Allergen Reactions  . Sulfonamide Derivatives   . Adhesive  [Tape] Rash  . Sulfa Antibiotics Rash    Objective:   VITALS: Per patient if applicable, see vitals. GENERAL: Alert, appears well and in no acute distress. HEENT: Atraumatic, conjunctiva clear, no obvious abnormalities on inspection of external nose and ears. NECK: Normal movements of the head and neck. CARDIOPULMONARY: No increased WOB. Speaking in clear sentences. I:E ratio WNL.  MS: Moves all visible extremities  without noticeable abnormality. PSYCH: Pleasant and cooperative, well-groomed. Speech normal rate and rhythm. Affect is appropriate. Insight and judgement are appropriate. Attention is focused, linear, and appropriate.  NEURO: CN grossly intact. Oriented as arrived to appointment on time with no prompting. Moves both UE equally.  SKIN: No obvious lesions, wounds, erythema, or  cyanosis noted on face or hands.  Depression screen PHQ 2/9 07/02/2018  Decreased Interest 1  Down, Depressed, Hopeless 0  PHQ - 2 Score 1  Altered sleeping 2  Tired, decreased energy 2  Change in appetite 2  Feeling bad or failure about yourself  2  Trouble concentrating 1  Moving slowly or fidgety/restless 1  Suicidal thoughts 0  PHQ-9 Score 11  Difficult doing work/chores Somewhat difficult    Assessment and Plan:   There are no diagnoses linked to this encounter.  Marland Kitchen COVID-19 Education: The signs and symptoms of COVID-19 were discussed with the patient and how to seek care for testing if needed. The importance of social distancing was discussed today. . Reviewed expectations re: course of current medical issues. . Discussed self-management of symptoms. . Outlined signs and symptoms indicating need for more acute intervention. . Patient verbalized understanding and all questions were answered. Marland Kitchen Health Maintenance issues including appropriate healthy diet, exercise, and smoking avoidance were discussed with patient. . See orders for this visit as documented in the electronic medical record.  Briscoe Deutscher, DO  Records requested if needed. Time spent: *** minutes, of which >50% was spent in obtaining information about her symptoms, reviewing her previous labs, evaluations, and treatments, counseling her about her condition (please see the discussed topics above), and developing a plan to further investigate it; she had a number of questions which I addressed.

## 2018-07-20 ENCOUNTER — Ambulatory Visit: Payer: Medicare Other | Admitting: Family Medicine

## 2018-07-20 ENCOUNTER — Encounter: Payer: Self-pay | Admitting: Gastroenterology

## 2018-07-21 NOTE — Telephone Encounter (Signed)
Patients says [employer] faxed a request for Tier 2 dexilent.

## 2018-07-21 NOTE — Telephone Encounter (Signed)
Called pt and left VM to call the office.  

## 2018-07-21 NOTE — Telephone Encounter (Signed)
Spoke with patient.  She has doxy appt tomorrow (07/22/2018) @ 1:20 with Dr. Juleen China.  She is asking to change this to in office visit so that she can get her Prolia injection at the same time.  Please call patient to advise.

## 2018-07-21 NOTE — Telephone Encounter (Signed)
Sent message via MyChart.

## 2018-07-21 NOTE — Telephone Encounter (Signed)
See note

## 2018-07-21 NOTE — Telephone Encounter (Signed)
Prior Josem Kaufmann has been submitted, waiting on response from insurance company.

## 2018-07-21 NOTE — Telephone Encounter (Signed)
Called patient app changed to office visit

## 2018-07-21 NOTE — Telephone Encounter (Signed)
Case number [id number] Approved on 07/18/2018 Please note that the medication is refill too soon until 07/23/2018

## 2018-07-22 ENCOUNTER — Encounter: Payer: Self-pay | Admitting: Family Medicine

## 2018-07-22 ENCOUNTER — Ambulatory Visit (INDEPENDENT_AMBULATORY_CARE_PROVIDER_SITE_OTHER): Payer: Medicare Other | Admitting: Family Medicine

## 2018-07-22 ENCOUNTER — Other Ambulatory Visit: Payer: Self-pay

## 2018-07-22 VITALS — BP 140/64 | HR 88 | Temp 98.7°F | Ht 67.0 in | Wt 220.0 lb

## 2018-07-22 DIAGNOSIS — M810 Age-related osteoporosis without current pathological fracture: Secondary | ICD-10-CM

## 2018-07-22 DIAGNOSIS — F325 Major depressive disorder, single episode, in full remission: Secondary | ICD-10-CM | POA: Diagnosis not present

## 2018-07-22 DIAGNOSIS — Z8719 Personal history of other diseases of the digestive system: Secondary | ICD-10-CM

## 2018-07-22 DIAGNOSIS — F419 Anxiety disorder, unspecified: Secondary | ICD-10-CM | POA: Diagnosis not present

## 2018-07-22 DIAGNOSIS — Z9889 Other specified postprocedural states: Secondary | ICD-10-CM

## 2018-07-22 DIAGNOSIS — K2100 Gastro-esophageal reflux disease with esophagitis, without bleeding: Secondary | ICD-10-CM

## 2018-07-22 DIAGNOSIS — G8929 Other chronic pain: Secondary | ICD-10-CM | POA: Diagnosis not present

## 2018-07-22 DIAGNOSIS — M542 Cervicalgia: Secondary | ICD-10-CM | POA: Diagnosis not present

## 2018-07-22 DIAGNOSIS — F5101 Primary insomnia: Secondary | ICD-10-CM

## 2018-07-22 MED ORDER — DENOSUMAB 60 MG/ML ~~LOC~~ SOSY
60.0000 mg | PREFILLED_SYRINGE | Freq: Once | SUBCUTANEOUS | Status: AC
  Administered 2018-07-23: 60 mg via SUBCUTANEOUS

## 2018-07-22 MED ORDER — ZOLPIDEM TARTRATE 10 MG PO TABS: 5.0000 mg | ORAL_TABLET | Freq: Every day | ORAL | 0 refills | Status: DC

## 2018-07-22 MED ORDER — BUPROPION HCL 75 MG PO TABS: 75.0000 mg | ORAL_TABLET | Freq: Two times a day (BID) | ORAL | 2 refills | Status: DC

## 2018-07-22 NOTE — Progress Notes (Signed)
Taylor Hopkins is a 68 y.o. female is here for follow up.  Assessment and Plan:   Osteoporosis, previously on bisphosphonate, with gastritis/dyspepsia Will start Prolia today. Reviewed benefits v risks.   Depression, Rx Celexa Depression screen [facility] 2/9 07/22/2018 07/02/2018  Decreased Interest 3 1  Down, Depressed, Hopeless 3 0  PHQ - 2 Score 6 1  Altered sleeping 3 2  Tired, decreased energy 3 2  Change in appetite 3 2  Feeling bad or failure about yourself  3 2  Trouble concentrating 3 1  Moving slowly or fidgety/restless 3 1  Suicidal thoughts 0 0  PHQ-9 Score 24 11  Difficult doing work/chores Somewhat difficult Somewhat difficult   Big change since last talk. Situational. Hard to get motivated. After discussion, patient would like to start below medication. Expectations, risks, and potential side effects reviewed.   Insomnia, Rx prn 1/2 Ambien Well controlled.  No signs of complications, medication side effects, or red flags.  Continue current regimen.    S/P dilatation of esophageal stricture Last week. Feeling much improved.   GERD (gastroesophageal reflux disease) Recent EGD. After our first visit, she stopped bisphosphonate and NSAID. She started PPI and carafate. Much improved.   Anxiety GAD 7 : Generalized Anxiety Score 07/22/2018 07/02/2018  Nervous, Anxious, on Edge 3 1  Control/stop worrying 3 2  Worry too much - different things 3 2  Trouble relaxing 3 1  Restless 3 1  Easily annoyed or irritable 3 0  Afraid - awful might happen 1 0  Total GAD 7 Score 19 7  Anxiety Difficulty Somewhat difficult Somewhat difficult   Reviewed coping mechanisms. She had a therapist last year, will contact that person.  No orders of the defined types were placed in this encounter.  Meds ordered this encounter  Medications  . zolpidem (AMBIEN) 10 MG tablet    Sig: Take 0.5 tablets (5 mg total) by mouth at bedtime.    Dispense:  30 tablet    Refill:  0  . buPROPion  (WELLBUTRIN) 75 MG tablet    Sig: Take 1 tablet (75 mg total) by mouth 2 (two) times daily.    Dispense:  60 tablet    Refill:  2  . denosumab (PROLIA) injection 60 mg   Subjective:   Lonell Grandchild, CMA acting as scribe for Dr. Briscoe Deutscher.   HPI Patient in office today to review labs.   Anxiety: Patient is currently on Celexa 40mg . She felt that it was working good in the past but not at all now. She has increase in all symptoms. She is currently taking ambien as needed. She only takes 1/2 tab.    Current symptoms: difficulty concentrating, fatigue, insomnia, irritable. No current suicidal and homicidal ideation. Side effects from treatment: none.   Health Maintenance:   Health Maintenance Due  Topic Date Due  . Hepatitis C Screening  June 22, 1950  . TETANUS/TDAP  10/02/1968  . MAMMOGRAM  10/03/1999  . DEXA SCAN  10/03/2014  . PNA vac Low Risk Adult (1 of 2 - PCV13) 10/03/2014   Depression screen [facility] 2/9 07/22/2018 07/02/2018  Decreased Interest 3 1  Down, Depressed, Hopeless 3 0  PHQ - 2 Score 6 1  Altered sleeping 3 2  Tired, decreased energy 3 2  Change in appetite 3 2  Feeling bad or failure about yourself  3 2  Trouble concentrating 3 1  Moving slowly or fidgety/restless 3 1  Suicidal thoughts 0 0  PHQ-9  Score 24 11  Difficult doing work/chores Somewhat difficult Somewhat difficult   PMHx, SurgHx, SocialHx, FamHx, Medications, and Allergies were reviewed in the Visit Navigator and updated as appropriate.   Patient Active Problem List   Diagnosis Date Noted  . S/P dilatation of esophageal stricture 07/23/2018  . Anxiety 07/23/2018  . Personal history of colonic polyps, followed by Dr. Fuller Plan 07/04/2018  . IBS (irritable bowel syndrome) 07/04/2018  . Pure hypercholesterolemia 07/04/2018  . Chronic neck pain due to DJD, DDD, unable to tolerate NSAIDs 07/04/2018  . Vitamin D deficiency, on daily supplement 07/04/2018  . Insomnia, Rx prn 1/2 Ambien 07/04/2018  .  Osteoporosis, previously on bisphosphonate, with gastritis/dyspepsia 07/04/2018  . Allergic rhinitis, Rx Nasocort 07/04/2018  . Depression, Rx Celexa 07/04/2018  . OSA (obstructive sleep apnea), not on CPAP 07/04/2018  . GERD (gastroesophageal reflux disease) 10/02/2008   Social History   Tobacco Use  . Smoking status: Never Smoker  . Smokeless tobacco: Never Used  Substance Use Topics  . Alcohol use: Yes    Alcohol/week: 0.0 standard drinks    Comment: 1 per month  . Drug use: No   Current Medications and Allergies:   Current Outpatient Medications:  .  BIOTIN PO, Take 3,000 mcg by mouth., Disp: , Rfl:  .  Calcium Citrate-Vitamin D (CALCIUM + D PO), Take by mouth., Disp: , Rfl:  .  citalopram (CELEXA) 40 MG tablet, Take 20 mg by mouth daily. Alternates between 20mg  and 40 mg daily. , Disp: , Rfl:  .  Cyanocobalamin (VITAMIN B12 PO), Take by mouth., Disp: , Rfl:  .  dexlansoprazole (DEXILANT) 60 MG capsule, Take 1 capsule (60 mg total) by mouth daily., Disp: 30 capsule, Rfl: 3 .  omeprazole (PRILOSEC) 20 MG capsule, Take 20 mg by mouth daily., Disp: , Rfl:  .  Probiotic Product (DIGESTIVE ADVANTAGE GUMMIES PO), Take by mouth., Disp: , Rfl:  .  sucralfate (CARAFATE) 1 g tablet, Take 1 tablet (1 g total) by mouth 4 (four) times daily -  with meals and at bedtime. Dissolve in 2 tsp water to make slurry., Disp: 120 tablet, Rfl: 0 .  triamcinolone (NASACORT) 55 MCG/ACT AERO nasal inhaler, Place 2 sprays into the nose daily., Disp: 1 Inhaler, Rfl: 0 .  zolpidem (AMBIEN) 10 MG tablet, Take 5 mg by mouth At bedtime., Disp: , Rfl:    Allergies  Allergen Reactions  . Sulfonamide Derivatives   . Adhesive  [Tape] Rash  . Sulfa Antibiotics Rash   Review of Systems   Pertinent items are noted in the HPI. Otherwise, ROS is negative.  Vitals:   Vitals:   07/22/18 1244  BP: 140/64  Pulse: 88  Temp: 98.7 F (37.1 C)  TempSrc: Oral  SpO2: 97%  Weight: 220 lb (99.8 kg)  Height: 5\' 7"   (1.702 m)     Body mass index is 34.46 kg/m. Physical Exam:   General: Cooperative, alert and oriented, well developed, well nourished, in no acute distress. HEENT: Pupils equal round reactive light and extraocular movements intact. Conjunctivae and lids unremarkable. No pallor or cyanosis, dentition good. Neck: No thyromegaly.  Cardiovascular: Regular rhythm. No murmurs appreciated.  Lungs: Normal work of breathing. Clear bilaterally without rales, rhonchi, or wheezing.  Abdomen: Soft, nontender, no masses. Normal bowel sounds. Extremities: No clubbing, cyanosis, erythema. No edema.  Skin: Warm and dry. Neurologic: No focal deficits.  Psychiatric: Normal affect and thought content.   . Reviewed expectations re: course of current medical issues. Marland Kitchen  Discussed self-management of symptoms. . Outlined signs and symptoms indicating need for more acute intervention. . Patient verbalized understanding and all questions were answered. Marland Kitchen Health Maintenance issues including appropriate healthy diet, exercise, and smoking avoidance were discussed with patient. . See orders for this visit as documented in the electronic medical record. . Patient received an After Visit Summary.  Briscoe Deutscher, DO [place], [place] [facility] 07/23/2018

## 2018-07-23 ENCOUNTER — Encounter: Payer: Self-pay | Admitting: Family Medicine

## 2018-07-23 DIAGNOSIS — F419 Anxiety disorder, unspecified: Secondary | ICD-10-CM | POA: Insufficient documentation

## 2018-07-23 DIAGNOSIS — M810 Age-related osteoporosis without current pathological fracture: Secondary | ICD-10-CM | POA: Diagnosis not present

## 2018-07-23 DIAGNOSIS — Z8719 Personal history of other diseases of the digestive system: Secondary | ICD-10-CM | POA: Insufficient documentation

## 2018-07-23 NOTE — Assessment & Plan Note (Signed)
Well controlled.  No signs of complications, medication side effects, or red flags.  Continue current regimen.   

## 2018-07-23 NOTE — Assessment & Plan Note (Signed)
Depression screen [facility] 2/9 07/22/2018 07/02/2018  Decreased Interest 3 1  Down, Depressed, Hopeless 3 0  PHQ - 2 Score 6 1  Altered sleeping 3 2  Tired, decreased energy 3 2  Change in appetite 3 2  Feeling bad or failure about yourself  3 2  Trouble concentrating 3 1  Moving slowly or fidgety/restless 3 1  Suicidal thoughts 0 0  PHQ-9 Score 24 11  Difficult doing work/chores Somewhat difficult Somewhat difficult   Big change since last talk. Situational. Hard to get motivated. After discussion, patient would like to start below medication. Expectations, risks, and potential side effects reviewed.

## 2018-07-23 NOTE — Assessment & Plan Note (Signed)
Will start Prolia today. Reviewed benefits v risks.

## 2018-07-23 NOTE — Assessment & Plan Note (Signed)
GAD 7 : Generalized Anxiety Score 07/22/2018 07/02/2018  Nervous, Anxious, on Edge 3 1  Control/stop worrying 3 2  Worry too much - different things 3 2  Trouble relaxing 3 1  Restless 3 1  Easily annoyed or irritable 3 0  Afraid - awful might happen 1 0  Total GAD 7 Score 19 7  Anxiety Difficulty Somewhat difficult Somewhat difficult   Reviewed coping mechanisms. She had a therapist last year, will contact that person.

## 2018-07-23 NOTE — Assessment & Plan Note (Signed)
Last week. Feeling much improved.

## 2018-07-23 NOTE — Telephone Encounter (Signed)
See note  Copied from [place] [id number]. Topic: General - Other >> Jul 23, 2018  9:58 AM Leward Quan A wrote: Reason for CRM: Patient called to say that she is requesting another tier reduction for her to get the dexlansoprazole (DEXILANT) 60 MG capsule. She state that tier 3 is still $190 and she cannot afford it. Please advise Ph# [phone]

## 2018-07-23 NOTE — Assessment & Plan Note (Signed)
Recent EGD. After our first visit, she stopped bisphosphonate and NSAID. She started PPI and carafate. Much improved.

## 2018-07-24 ENCOUNTER — Other Ambulatory Visit: Payer: Self-pay | Admitting: Family Medicine

## 2018-07-24 DIAGNOSIS — R1013 Epigastric pain: Secondary | ICD-10-CM

## 2018-07-26 NOTE — Telephone Encounter (Signed)
Last OV 07/22/18 Last refill 07/02/18 #120/0 Next OV 11/19/18

## 2018-07-27 MED ORDER — CHOLECALCIFEROL 1.25 MG (50000 UT) PO TABS: 1.0000 | ORAL_TABLET | ORAL | 0 refills | Status: DC

## 2018-07-27 NOTE — Telephone Encounter (Signed)
Copied from [place] [phone]. Topic: General - Other >> Jul 23, 2018  9:58 AM Leward Quan A wrote: Reason for CRM: Patient called to say that she is requesting another tier reduction for her to get the dexlansoprazole (DEXILANT) 60 MG capsule. She state that tier 3 is still $190 and she cannot afford it. Please advise Ph# [phone]

## 2018-07-30 NOTE — Telephone Encounter (Signed)
Message sent to patient

## 2018-08-02 DIAGNOSIS — H26492 Other secondary cataract, left eye: Secondary | ICD-10-CM | POA: Diagnosis not present

## 2018-08-02 DIAGNOSIS — H04123 Dry eye syndrome of bilateral lacrimal glands: Secondary | ICD-10-CM | POA: Diagnosis not present

## 2018-08-02 DIAGNOSIS — Z961 Presence of intraocular lens: Secondary | ICD-10-CM | POA: Diagnosis not present

## 2018-08-06 ENCOUNTER — Telehealth: Payer: Self-pay | Admitting: Physical Medicine and Rehabilitation

## 2018-08-09 ENCOUNTER — Telehealth: Payer: Self-pay | Admitting: Family Medicine

## 2018-08-09 NOTE — Telephone Encounter (Signed)
See request °

## 2018-08-09 NOTE — Telephone Encounter (Unsigned)
Copied from [place] [id number]. Topic: General - Inquiry >> Aug 09, 2018  3:22 PM Mathis Bud wrote: Reason for CRM: Patient is requesting nurse of PCP to call back regarding medication  dexlansoprazole ([place]).  Patient is requesting another reduction, due to insurance purposes.  Call back # [phone]

## 2018-08-10 NOTE — Telephone Encounter (Signed)
I spoke with patient and she informed me that she already spoke with someone here, whom told her that her insurance request form  was here and that they had faxed it.  This is pertaining to a reduction in patent's Dexilant rx.

## 2018-08-13 DIAGNOSIS — Z90710 Acquired absence of both cervix and uterus: Secondary | ICD-10-CM | POA: Diagnosis not present

## 2018-08-13 DIAGNOSIS — Z803 Family history of malignant neoplasm of breast: Secondary | ICD-10-CM | POA: Diagnosis not present

## 2018-08-13 DIAGNOSIS — Z1231 Encounter for screening mammogram for malignant neoplasm of breast: Secondary | ICD-10-CM | POA: Diagnosis not present

## 2018-08-13 DIAGNOSIS — M8589 Other specified disorders of bone density and structure, multiple sites: Secondary | ICD-10-CM | POA: Diagnosis not present

## 2018-08-13 DIAGNOSIS — Z780 Asymptomatic menopausal state: Secondary | ICD-10-CM | POA: Diagnosis not present

## 2018-08-13 LAB — HM MAMMOGRAPHY

## 2018-08-13 LAB — HM DEXA SCAN

## 2018-08-15 ENCOUNTER — Other Ambulatory Visit: Payer: Self-pay | Admitting: Family Medicine

## 2018-08-20 ENCOUNTER — Ambulatory Visit
Admission: RE | Admit: 2018-08-20 | Discharge: 2018-08-20 | Disposition: A | Discharge status: Home / Self Care | Payer: Medicare Other | Source: Ambulatory Visit | Attending: Physical Medicine and Rehabilitation | Admitting: Physical Medicine and Rehabilitation

## 2018-08-20 DIAGNOSIS — M542 Cervicalgia: Secondary | ICD-10-CM

## 2018-08-20 DIAGNOSIS — M4802 Spinal stenosis, cervical region: Secondary | ICD-10-CM | POA: Diagnosis not present

## 2018-08-25 NOTE — Telephone Encounter (Signed)
Called pt and lvm

## 2018-08-30 ENCOUNTER — Telehealth: Payer: Self-pay | Admitting: Family Medicine

## 2018-08-30 NOTE — Telephone Encounter (Signed)
Pt returning your call. Please call back °

## 2018-08-30 NOTE — Telephone Encounter (Signed)
See note  Copied from [place] [id number]. Topic: General - Inquiry >> Aug 30, 2018 11:19 AM Berneta Levins wrote: Reason for CRM:  Pt called and wants to know if her PCP has ordered a Oracollect.Dx OCD-100 [place] sample kit.  States that it arrived at her home and she wants to know what that is for.

## 2018-08-30 NOTE — Telephone Encounter (Signed)
Left message to return call to our office.  

## 2018-09-01 ENCOUNTER — Other Ambulatory Visit: Payer: Self-pay

## 2018-09-01 ENCOUNTER — Ambulatory Visit: Payer: Self-pay | Admitting: *Deleted

## 2018-09-01 MED ORDER — CEPHALEXIN 500 MG PO CAPS: 500.0000 mg | ORAL_CAPSULE | Freq: Three times a day (TID) | ORAL | 0 refills | Status: DC

## 2018-09-01 NOTE — Telephone Encounter (Signed)
Called in patient informed.

## 2018-09-01 NOTE — Telephone Encounter (Signed)
Okay to send in Keflex 500 TID x 5 days. Follow up soon if no improvement. Make sure that she has a f/u on the books.

## 2018-09-01 NOTE — Telephone Encounter (Signed)
Please see below.

## 2018-09-01 NOTE — Telephone Encounter (Signed)
Please contact this patient regarding an Oracollect that has been ordered for her. See yesterday's TE for more information.

## 2018-09-02 NOTE — Telephone Encounter (Signed)
See nurse triage note all questions have been answered.

## 2018-09-07 ENCOUNTER — Other Ambulatory Visit: Payer: Self-pay

## 2018-09-07 ENCOUNTER — Ambulatory Visit (INDEPENDENT_AMBULATORY_CARE_PROVIDER_SITE_OTHER): Payer: Medicare Other | Admitting: Physical Medicine and Rehabilitation

## 2018-09-07 ENCOUNTER — Encounter: Payer: Self-pay | Admitting: Physical Medicine and Rehabilitation

## 2018-09-07 VITALS — BP 144/87 | HR 81 | Ht 66.0 in | Wt 215.0 lb

## 2018-09-07 DIAGNOSIS — M546 Pain in thoracic spine: Secondary | ICD-10-CM | POA: Diagnosis not present

## 2018-09-07 DIAGNOSIS — M542 Cervicalgia: Secondary | ICD-10-CM

## 2018-09-07 DIAGNOSIS — M47812 Spondylosis without myelopathy or radiculopathy, cervical region: Secondary | ICD-10-CM

## 2018-09-07 DIAGNOSIS — G8929 Other chronic pain: Secondary | ICD-10-CM

## 2018-09-07 DIAGNOSIS — M7918 Myalgia, other site: Secondary | ICD-10-CM

## 2018-09-07 NOTE — Progress Notes (Signed)
 .  Numeric Pain Rating Scale and Functional Assessment Average Pain 6 Pain Right Now 4 My pain is intermittent, dull and aching Pain is worse with: bending and some activites Pain improves with: heat/ice   In the last MONTH (on 0-10 scale) has pain interfered with the following?  1. General activity like being  able to carry out your everyday physical activities such as walking, climbing stairs, carrying groceries, or moving a chair?  Rating(6)  2. Relation with others like being able to carry out your usual social activities and roles such as  activities at home, at work and in your community. Rating(6)  3. Enjoyment of life such that you have  been bothered by emotional problems such as feeling anxious, depressed or irritable?  Rating(0)

## 2018-09-08 ENCOUNTER — Other Ambulatory Visit: Payer: Self-pay | Admitting: Family Medicine

## 2018-09-08 ENCOUNTER — Encounter: Payer: Self-pay | Admitting: Family Medicine

## 2018-09-08 ENCOUNTER — Encounter: Payer: Self-pay | Admitting: Physical Medicine and Rehabilitation

## 2018-09-08 MED ORDER — BUPROPION HCL 75 MG PO TABS: 75.0000 mg | ORAL_TABLET | Freq: Two times a day (BID) | ORAL | 0 refills | Status: DC

## 2018-09-08 NOTE — Progress Notes (Signed)
Taylor Hopkins - 68 y.o. female MRN [id number]  Date of birth: [birth year]  Office Visit Note: Visit Date: 09/07/2018 PCP: Briscoe Deutscher, DO Referred by: Briscoe Deutscher, DO  Subjective: Chief Complaint  Patient presents with   Neck - Pain   Left Shoulder - Pain   Middle Back - Pain, Numbness   HPI: Taylor Hopkins is a 68 y.o. female who comes in today At the request of Dr. Briscoe Deutscher for evaluation management of worsening severe left-sided axial neck pain and some upper back pain.  Patient has a several year history of chronic neck pain that she used to get some relief with nonsteroidal anti-inflammatories but is unable to take those at this point.  Other pain medication has not been successful.  Her symptoms are left-sided upper cervical pain worse with left neck rotation.  She gets some referral down into the trapezial area anteriorly more than posterior.  She has no shoulder pain with movement.  She has no right-sided complaints.  No pain down the arms or paresthesias.  She has had no specific trauma recently or anything that she relates to the specific neck problems.  She has had some minor traumas in the past.  She also relates a patch or area over the midline of the spine in the upper thoracic region that is tender to the touch or when she leans on something hard.  She denies any specific pain at the shoulder blade area.  She has not had recent physical therapy or any chiropractic care.  She has a remote history of low back pain and lumbar surgery.  This was performed by Dr. Joya Salm who has since retired.  She was having more sciatica of the lower limbs.  She denies any fevers chills night sweats or night pain or other red flag complaints.  No associated headaches.  We did obtain MRI of the cervical spine before the consultation given the history of the patient.  Case is complicated by history of depression anxiety as well as irritable bowel syndrome.  Review of Systems    Constitutional: Negative for chills, fever, malaise/fatigue and weight loss.  HENT: Negative for hearing loss and sinus pain.   Eyes: Negative for blurred vision, double vision and photophobia.  Respiratory: Negative for cough and shortness of breath.   Cardiovascular: Negative for chest pain, palpitations and leg swelling.  Gastrointestinal: Negative for abdominal pain, nausea and vomiting.  Genitourinary: Negative for flank pain.  Musculoskeletal: Positive for back pain and neck pain. Negative for myalgias.  Skin: Negative for itching and rash.  Neurological: Negative for tremors, focal weakness and weakness.  Endo/Heme/Allergies: Negative.   Psychiatric/Behavioral: Negative for depression.  All other systems reviewed and are negative.  Otherwise per HPI.  Assessment & Plan: Visit Diagnoses:  1. Cervicalgia   2. Cervical spondylosis without myelopathy   3. Myofascial pain syndrome   4. Chronic midline thoracic back pain     Plan: Findings:  Chronic worsening severe axial left-sided neck pain with some referral to the anterior part of the trapezius area which I feel like is directly related to osteoarthritis of the left C4-5 and C3-4 facet arthritis.  Interestingly at C5-6 she has disc osteophyte complex with pretty severe foraminal stenosis but no symptoms on the right whatsoever.  There is some foraminal narrowing at C6-7 but this appears to be moderate I doubt that that is doing anything at this point.  She has no radicular complaints.  No central stenosis.  She  does have reversal of the lordosis with some small listhesis but again no real central stenosis and at 67 I doubt that this would progress anytime soon.  It may be something that is there much later in life.  She also has myofascial pain syndrome with trigger points.  These trigger points are palpable and reproduce some of her upper back pain.  I think the area of pain along the midline of the spinous processes is really a  combination of myofascial pain and tension and just posture related issues.  We discussed at length planned in the first plan would be diagnostic medial branch/facet block at C3-4 C4-5.  This will be done with fluoroscopic guidance.  She also would benefit potentially from physical therapy and myofascial pain management with possible trigger point treatment or dry needling.  I have asked her to discuss this with Dr. Juleen China because I think they do have therapy at their office.  Obviously that is a referral I can make as well depending on how she is doing.  Should continue with current medication.    Meds & Orders: No orders of the defined types were placed in this encounter.  No orders of the defined types were placed in this encounter.   Follow-up: Return for Left C3-4 C4-5 facet joint block.   Procedures: No procedures performed  No notes on file   Clinical History: MRI CERVICAL SPINE WITHOUT CONTRAST  TECHNIQUE: Multiplanar, multisequence MR imaging of the cervical spine was performed. No intravenous contrast was administered.  COMPARISON:  MRI cervical spine dated March 30, 2006.  FINDINGS: Alignment: Increased reversal of the normal cervical lordosis. New 2 mm anterolisthesis at C3-C4. Unchanged 2 mm anterolisthesis at C4-C5.  Vertebrae: No fracture, evidence of discitis, or bone lesion.  Cord: Normal signal and morphology.  Posterior Fossa, vertebral arteries, paraspinal tissues: Negative.  Disc levels:  C2-C3: New tiny central disc protrusion. Progressive moderate left facet arthropathy. No stenosis.  C3-C4: Negative disc. Progressive moderate bilateral facet arthropathy. No stenosis.  C4-C5: Unchanged mild disc bulging. Progressive moderate left and mild right facet arthropathy. No stenosis.  C5-C6: Progress right-sided disc osteophyte complex and severe right uncovertebral hypertrophy. Progressive severe right neuroforaminal stenosis. No spinal canal  or left neuroforaminal stenosis.  C6-C7: Unchanged small posterior disc osteophyte complex with progressive mild to moderate uncovertebral hypertrophy. New moderate bilateral neuroforaminal stenosis. No spinal canal stenosis.  C7-T1: Negative disc. Progressive mild to moderate bilateral facet arthropathy. No stenosis.  Unchanged small central disc protrusion at T2-T3.  IMPRESSION: 1. Progressive multilevel cervical spondylosis as described above. New moderate bilateral neuroforaminal stenosis at C6-C7. 2. Progressive severe right neuroforaminal stenosis at C5-C6.   Electronically Signed   By: Titus Dubin M.D.   On: 08/20/2018 16:51   She reports that she has never smoked. She has never used smokeless tobacco.  Recent Labs    07/05/18 1013  HGBA1C 6.1    Objective:  VS:  HT:5\' 6"  (167.6 cm)    WT:215 lb (97.5 kg)   BMI:34.72     BP:(!) 144/87   HR:81bpm   TEMP: ( )   RESP:  Physical Exam Vitals signs and nursing note reviewed.  Constitutional:      General: She is not in acute distress.    Appearance: Normal appearance. She is well-developed. She is obese.  HENT:     Head: Normocephalic and atraumatic.     Nose: Nose normal.  Eyes:     Conjunctiva/sclera: Conjunctivae normal.  Pupils: Pupils are equal, round, and reactive to light.  Neck:     Musculoskeletal: Neck supple.  Cardiovascular:     Rate and Rhythm: Regular rhythm.  Pulmonary:     Effort: Pulmonary effort is normal. No respiratory distress.  Abdominal:     General: There is no distension.     Palpations: Abdomen is soft.     Tenderness: There is no guarding.  Musculoskeletal:     Right lower leg: No edema.     Left lower leg: No edema.     Comments: Patient sits with forward flexed cervical spine she does have concordant pain with left rotation and facet loading.  No pain with right rotation.  Negative Spurling's test bilaterally.  She does have myofascial trigger points in the trapezius  levator scapula rhomboids and supraspinatus but none of these reproduce her neck pain.  She has no shoulder impingement per se mild pain at end ranges.  Good strength in the upper limbs bilaterally and a negative Hoffmann's test.  Lymphadenopathy:     Cervical: No cervical adenopathy.  Skin:    General: Skin is warm and dry.     Findings: No erythema or rash.  Neurological:     General: No focal deficit present.     Mental Status: She is alert and oriented to person, place, and time.     Cranial Nerves: No cranial nerve deficit.     Sensory: No sensory deficit.     Motor: No abnormal muscle tone.     Coordination: Coordination normal.     Gait: Gait normal.  Psychiatric:        Mood and Affect: Mood normal.        Behavior: Behavior normal.        Thought Content: Thought content normal.     Ortho Exam Imaging: No results found.  Past Medical/Family/Surgical/Social History: Medications & Allergies reviewed per EMR, new medications updated. Patient Active Problem List   Diagnosis Date Noted   S/P dilatation of esophageal stricture 07/23/2018   Anxiety 07/23/2018   Personal history of colonic polyps, followed by Dr. Fuller Plan 07/04/2018   IBS (irritable bowel syndrome) 07/04/2018   Pure hypercholesterolemia 07/04/2018   Chronic neck pain due to DJD, DDD, unable to tolerate NSAIDs 07/04/2018   Vitamin D deficiency, on daily supplement 07/04/2018   Insomnia, Rx prn 1/2 Ambien 07/04/2018   Osteoporosis, previously on bisphosphonate, with gastritis/dyspepsia 07/04/2018   Allergic rhinitis, Rx Nasocort 07/04/2018   Depression, Rx Celexa 07/04/2018   OSA (obstructive sleep apnea), not on CPAP 07/04/2018   GERD (gastroesophageal reflux disease) 10/02/2008   Past Medical History:  Diagnosis Date   Adenomatous colon polyp 06/2003   Allergic rhinitis    Anxiety    Arthritis    Cataract 2010   bilateral eyes   Depression    GERD (gastroesophageal reflux disease)      Sleep apnea    Not currently using CPAP - last sleep study 2016   Family History  Problem Relation Age of Onset   Allergies Brother    Heart attack Father    Colon polyps Father    Diverticulitis Father    Heart failure Father    Esophageal cancer Maternal Grandmother    Heart failure Mother    Mitral valve prolapse Mother    Diabetes Mother    Kidney failure Mother    Stomach cancer Paternal Uncle    Colon cancer Neg Hx    Rectal cancer Neg Hx  Past Surgical History:  Procedure Laterality Date   ANKLE SURGERY Right    BREAST ENHANCEMENT SURGERY     COLONOSCOPY     LUMBAR DISC SURGERY     TONSILLECTOMY     TOTAL ABDOMINAL HYSTERECTOMY     UPPER GASTROINTESTINAL ENDOSCOPY     Social History   Occupational History    Employer: RETIRED    Comment: Engineer  Tobacco Use   Smoking status: Never Smoker   Smokeless tobacco: Never Used  Substance and Sexual Activity   Alcohol use: Yes    Alcohol/week: 0.0 standard drinks    Comment: 1 per month   Drug use: No   Sexual activity: Yes    Birth control/protection: Post-menopausal

## 2018-09-21 ENCOUNTER — Other Ambulatory Visit: Payer: Self-pay | Admitting: Physical Medicine and Rehabilitation

## 2018-09-21 DIAGNOSIS — M47812 Spondylosis without myelopathy or radiculopathy, cervical region: Secondary | ICD-10-CM

## 2018-09-21 DIAGNOSIS — M542 Cervicalgia: Secondary | ICD-10-CM

## 2018-09-21 DIAGNOSIS — G8929 Other chronic pain: Secondary | ICD-10-CM

## 2018-09-24 ENCOUNTER — Ambulatory Visit: Payer: Self-pay

## 2018-09-24 ENCOUNTER — Other Ambulatory Visit: Payer: Self-pay

## 2018-09-24 ENCOUNTER — Ambulatory Visit (INDEPENDENT_AMBULATORY_CARE_PROVIDER_SITE_OTHER): Payer: Medicare Other | Admitting: Physical Medicine and Rehabilitation

## 2018-09-24 ENCOUNTER — Encounter: Payer: Self-pay | Admitting: Physical Medicine and Rehabilitation

## 2018-09-24 VITALS — BP 135/71 | HR 78

## 2018-09-24 DIAGNOSIS — M47812 Spondylosis without myelopathy or radiculopathy, cervical region: Secondary | ICD-10-CM

## 2018-09-24 DIAGNOSIS — M7062 Trochanteric bursitis, left hip: Secondary | ICD-10-CM

## 2018-09-24 DIAGNOSIS — M542 Cervicalgia: Secondary | ICD-10-CM

## 2018-09-24 MED ORDER — LIDOCAINE HCL 2 % IJ SOLN
4.0000 mL | INTRAMUSCULAR | Status: AC | PRN
  Administered 2018-09-24: 4 mL

## 2018-09-24 MED ORDER — BUPIVACAINE HCL 0.25 % IJ SOLN
4.0000 mL | INTRAMUSCULAR | Status: AC | PRN
  Administered 2018-09-24: 4 mL via INTRA_ARTICULAR

## 2018-09-24 MED ORDER — METHYLPREDNISOLONE ACETATE 80 MG/ML IJ SUSP
80.0000 mg | Freq: Once | INTRAMUSCULAR | Status: AC
  Administered 2018-09-24: 80 mg

## 2018-09-24 MED ORDER — TRIAMCINOLONE ACETONIDE 40 MG/ML IJ SUSP
80.0000 mg | INTRAMUSCULAR | Status: AC | PRN
  Administered 2018-09-24: 80 mg via INTRA_ARTICULAR

## 2018-09-24 NOTE — Progress Notes (Signed)
 .  Numeric Pain Rating Scale and Functional Assessment Average Pain 7   In the last MONTH (on 0-10 scale) has pain interfered with the following?  1. General activity like being  able to carry out your everyday physical activities such as walking, climbing stairs, carrying groceries, or moving a chair?  Rating(5)   +Driver, -BT, -Dye Allergies.  

## 2018-09-24 NOTE — Progress Notes (Signed)
Taylor Hopkins - 68 y.o. female MRN [id number]  Date of birth: 1950-03-15  Office Visit Note: Visit Date: 09/24/2018 PCP: Briscoe Deutscher, DO Referred by: Briscoe Deutscher, DO  Subjective: Chief Complaint  Patient presents with  . Neck - Pain  . Left Shoulder - Pain  . Left Hip - Pain   HPI:  Taylor Hopkins is a 68 y.o. female who comes in today For planned left C3-4 facet joint block.  Our prior notes can be reviewed for details justification.  Unfortunately the patient came in today without a required driver that we do need to have before we can do the injection.  She also stated that her left greater trochanter was really more problematic at this point than the neck.  We elected to complete diagnostic greater trochanteric injection with fluoroscopic guidance.  Guidance was utilized to do body habitus.  We will reschedule her for the cervical facet joint block.  ROS Otherwise per HPI.  Assessment & Plan: Visit Diagnoses:  1. Cervical spondylosis without myelopathy   2. Cervicalgia   3. Greater trochanteric bursitis, left     Plan: No additional findings.   Meds & Orders:  Meds ordered this encounter  Medications  . methylPREDNISolone acetate (DEPO-MEDROL) injection 80 mg    Orders Placed This Encounter  Procedures  . Large Joint Inj: L greater trochanter  . XR C-ARM NO REPORT    Follow-up: Return if symptoms worsen or fail to improve.   Procedures: Large Joint Inj: L greater trochanter on 09/24/2018 2:23 PM Indications: pain and diagnostic evaluation Details: 22 G 3.5 in needle, fluoroscopy-guided lateral approach  Arthrogram: No  Medications: 4 mL lidocaine 2 %; 80 mg triamcinolone acetonide 40 MG/ML; 4 mL bupivacaine 0.25 % Outcome: tolerated well, no immediate complications  There was excellent flow of contrast outlined the greater trochanteric bursa without vascular uptake. Procedure, treatment alternatives, risks and benefits explained, specific risks  discussed. Consent was given by the patient. Immediately prior to procedure a time out was called to verify the correct patient, procedure, equipment, support staff and site/side marked as required. Patient was prepped and draped in the usual sterile fashion.      No notes on file   Clinical History: MRI CERVICAL SPINE WITHOUT CONTRAST  TECHNIQUE: Multiplanar, multisequence MR imaging of the cervical spine was performed. No intravenous contrast was administered.  COMPARISON:  MRI cervical spine dated March 30, 2006.  FINDINGS: Alignment: Increased reversal of the normal cervical lordosis. New 2 mm anterolisthesis at C3-C4. Unchanged 2 mm anterolisthesis at C4-C5.  Vertebrae: No fracture, evidence of discitis, or bone lesion.  Cord: Normal signal and morphology.  Posterior Fossa, vertebral arteries, paraspinal tissues: Negative.  Disc levels:  C2-C3: New tiny central disc protrusion. Progressive moderate left facet arthropathy. No stenosis.  C3-C4: Negative disc. Progressive moderate bilateral facet arthropathy. No stenosis.  C4-C5: Unchanged mild disc bulging. Progressive moderate left and mild right facet arthropathy. No stenosis.  C5-C6: Progress right-sided disc osteophyte complex and severe right uncovertebral hypertrophy. Progressive severe right neuroforaminal stenosis. No spinal canal or left neuroforaminal stenosis.  C6-C7: Unchanged small posterior disc osteophyte complex with progressive mild to moderate uncovertebral hypertrophy. New moderate bilateral neuroforaminal stenosis. No spinal canal stenosis.  C7-T1: Negative disc. Progressive mild to moderate bilateral facet arthropathy. No stenosis.  Unchanged small central disc protrusion at T2-T3.  IMPRESSION: 1. Progressive multilevel cervical spondylosis as described above. New moderate bilateral neuroforaminal stenosis at C6-C7. 2. Progressive severe right neuroforaminal stenosis at  C5-C6.   Electronically Signed   By: Titus Dubin M.D.   On: 08/20/2018 16:51     Objective:  VS:  HT:    WT:   BMI:     BP:135/71  HR:78bpm  TEMP: ( )  RESP:  Physical Exam  Ortho Exam Imaging: Xr C-arm No Report  Result Date: 09/24/2018 Please see Notes tab for imaging impression.

## 2018-09-30 ENCOUNTER — Other Ambulatory Visit: Payer: Self-pay | Admitting: Family Medicine

## 2018-09-30 NOTE — Telephone Encounter (Signed)
Message routed to PCP CMA for refill

## 2018-10-01 MED ORDER — ZOLPIDEM TARTRATE 10 MG PO TABS: 5.0000 mg | ORAL_TABLET | Freq: Every day | ORAL | 3 refills | Status: DC

## 2018-10-01 NOTE — Telephone Encounter (Signed)
   F/u 11/19/2018 07/22/2018 last seen

## 2018-10-11 ENCOUNTER — Ambulatory Visit (INDEPENDENT_AMBULATORY_CARE_PROVIDER_SITE_OTHER): Payer: Medicare Other | Admitting: Physical Medicine and Rehabilitation

## 2018-10-11 ENCOUNTER — Ambulatory Visit: Payer: Self-pay

## 2018-10-11 ENCOUNTER — Encounter: Payer: Self-pay | Admitting: Physical Medicine and Rehabilitation

## 2018-10-11 VITALS — BP 143/101 | HR 89 | Ht 66.75 in | Wt 212.0 lb

## 2018-10-11 DIAGNOSIS — M47812 Spondylosis without myelopathy or radiculopathy, cervical region: Secondary | ICD-10-CM | POA: Diagnosis not present

## 2018-10-11 MED ORDER — METHYLPREDNISOLONE ACETATE 80 MG/ML IJ SUSP
80.0000 mg | Freq: Once | INTRAMUSCULAR | Status: AC
  Administered 2018-10-11: 80 mg

## 2018-10-11 NOTE — Progress Notes (Signed)
Taylor Hopkins - 68 y.o. female MRN [id number]  Date of birth: 02/04/1951  Office Visit Note: Visit Date: 10/11/2018 PCP: Briscoe Deutscher, DO Referred by: Briscoe Deutscher, DO  Subjective: Chief Complaint  Patient presents with  . Neck - Pain   HPI:  Taylor Hopkins is a 68 y.o. female who comes in today For planned left C3-4 and C4-5 facet joint block for left neck and shoulder pain.  Please see our prior notes for further details and justification.  I last patient encounter she did not have a driver which is required to give a cervical injection.  We did complete greater trochanteric injection that day and she is done extremely well with that and is very pleased with the injection.  She states she has had many greater trochanteric injections in the past but none of really helped like this 1 which was performed with fluoroscopic guidance.  She is failed conservative care with her neck pain including physical therapy and dry needling as well as medication management.  This would be the first injection and double block.  For possible radiofrequency ablation.  ROS Otherwise per HPI.  Assessment & Plan: Visit Diagnoses:  1. Cervical spondylosis without myelopathy     Plan: No additional findings.   Meds & Orders:  Meds ordered this encounter  Medications  . methylPREDNISolone acetate (DEPO-MEDROL) injection 80 mg    Orders Placed This Encounter  Procedures  . Facet Injection  . XR C-ARM NO REPORT    Follow-up: Return if symptoms worsen or fail to improve.   Procedures: No procedures performed  Diagnostic Cervical Facet Joint Nerve Block with Fluoroscopic Guidance  Patient: Taylor Hopkins      Date of Birth: [birth year] MRN: [id number] PCP: Briscoe Deutscher, DO      Visit Date: 10/11/2018   Universal Protocol:    Date/Time: 10/11/2010:18 PM  Consent Given By: the patient  Position: LATERAL  Additional Comments: Vital signs were monitored before and after the procedure.  Patient was prepped and draped in the usual sterile fashion. The correct patient, procedure, and site was verified.   Injection Procedure Details:  Procedure Site One Meds Administered:  Meds ordered this encounter  Medications  . methylPREDNISolone acetate (DEPO-MEDROL) injection 80 mg     Laterality: Left  Location/Site:  C3-4 C4-5  Needle size: 25 G  Needle type: Standard  Needle Placement: Articular Pillar  Findings:  -Contrast Used: 0.5 mL iohexol 180 mg iodine/mL   -Comments: Good flow of contrast over the articular pillar and joint.  No intravascular flow.  Procedure Details: The fluoroscope beam was manipulated to achieve the best "true" lateral view possible by squaring off the endplates with cranial and caudal tilt and using varying obliquity to achieve the a view with the longest length of spinous process.  The region overlying the facet joints mentioned above were then localized under fluoroscopic visualization.  The needle was inserted down to the center of the "trapezoid" outline of the facet joint lateral mass. Bi-planar images were used for confirming placement and spot radiographs were documented.  A 0.25 ml volume of Omnipaque-240 was injected to look for vascular uptake. A 0.5 ml. volume of the anesthetic solution was injected onto the target. This procedure was repeated for each medial branch nerve injected.  Prior to the procedure, the patient was given a Pain Diary which was completed for baseline measurements.  After the procedure, the patient rated their pain every 30 minutes and will continue rating  at this frequency for a total of 5 hours.  The patient has been asked to complete the Diary and return to [place] by mail, fax or hand delivered as soon as possible.   Additional Comments:  The patient tolerated the procedure well Dressing: Band-Aid    Post-procedure details: Patient was observed during the procedure. Post-procedure instructions were  reviewed.  Patient left the clinic in stable condition.     Clinical History: MRI CERVICAL SPINE WITHOUT CONTRAST  TECHNIQUE: Multiplanar, multisequence MR imaging of the cervical spine was performed. No intravenous contrast was administered.  COMPARISON:  MRI cervical spine dated March 30, 2006.  FINDINGS: Alignment: Increased reversal of the normal cervical lordosis. New 2 mm anterolisthesis at C3-C4. Unchanged 2 mm anterolisthesis at C4-C5.  Vertebrae: No fracture, evidence of discitis, or bone lesion.  Cord: Normal signal and morphology.  Posterior Fossa, vertebral arteries, paraspinal tissues: Negative.  Disc levels:  C2-C3: New tiny central disc protrusion. Progressive moderate left facet arthropathy. No stenosis.  C3-C4: Negative disc. Progressive moderate bilateral facet arthropathy. No stenosis.  C4-C5: Unchanged mild disc bulging. Progressive moderate left and mild right facet arthropathy. No stenosis.  C5-C6: Progress right-sided disc osteophyte complex and severe right uncovertebral hypertrophy. Progressive severe right neuroforaminal stenosis. No spinal canal or left neuroforaminal stenosis.  C6-C7: Unchanged small posterior disc osteophyte complex with progressive mild to moderate uncovertebral hypertrophy. New moderate bilateral neuroforaminal stenosis. No spinal canal stenosis.  C7-T1: Negative disc. Progressive mild to moderate bilateral facet arthropathy. No stenosis.  Unchanged small central disc protrusion at T2-T3.  IMPRESSION: 1. Progressive multilevel cervical spondylosis as described above. New moderate bilateral neuroforaminal stenosis at C6-C7. 2. Progressive severe right neuroforaminal stenosis at C5-C6.   Electronically Signed   By: Titus Dubin M.D.   On: 08/20/2018 16:51     Objective:  VS:  HT:5' 6.75" (169.5 cm)   WT:212 lb (96.2 kg)  BMI:33.47    BP:(!) 143/101  HR:89bpm  TEMP: ( )  RESP:   Physical Exam  Ortho Exam Imaging: Xr C-arm No Report  Result Date: 10/11/2018 Please see Notes tab for imaging impression.

## 2018-10-11 NOTE — Procedures (Signed)
Diagnostic Cervical Facet Joint Nerve Block with Fluoroscopic Guidance  Patient: Taylor Hopkins      Date of Birth: 15-Jan-1951 MRN: [id number] PCP: Briscoe Deutscher, DO      Visit Date: 10/11/2018   Universal Protocol:    Date/Time: 10/11/2010:18 PM  Consent Given By: the patient  Position: LATERAL  Additional Comments: Vital signs were monitored before and after the procedure. Patient was prepped and draped in the usual sterile fashion. The correct patient, procedure, and site was verified.   Injection Procedure Details:  Procedure Site One Meds Administered:  Meds ordered this encounter  Medications  . methylPREDNISolone acetate (DEPO-MEDROL) injection 80 mg     Laterality: Left  Location/Site:  C3-4 C4-5  Needle size: 25 G  Needle type: Standard  Needle Placement: Articular Pillar  Findings:  -Contrast Used: 0.5 mL iohexol 180 mg iodine/mL   -Comments: Good flow of contrast over the articular pillar and joint.  No intravascular flow.  Procedure Details: The fluoroscope beam was manipulated to achieve the best "true" lateral view possible by squaring off the endplates with cranial and caudal tilt and using varying obliquity to achieve the a view with the longest length of spinous process.  The region overlying the facet joints mentioned above were then localized under fluoroscopic visualization.  The needle was inserted down to the center of the "trapezoid" outline of the facet joint lateral mass. Bi-planar images were used for confirming placement and spot radiographs were documented.  A 0.25 ml volume of Omnipaque-240 was injected to look for vascular uptake. A 0.5 ml. volume of the anesthetic solution was injected onto the target. This procedure was repeated for each medial branch nerve injected.  Prior to the procedure, the patient was given a Pain Diary which was completed for baseline measurements.  After the procedure, the patient rated their pain every 30  minutes and will continue rating at this frequency for a total of 5 hours.  The patient has been asked to complete the Diary and return to [place] by mail, fax or hand delivered as soon as possible.   Additional Comments:  The patient tolerated the procedure well Dressing: Band-Aid    Post-procedure details: Patient was observed during the procedure. Post-procedure instructions were reviewed.  Patient left the clinic in stable condition.

## 2018-10-11 NOTE — Progress Notes (Signed)
 .  Numeric Pain Rating Scale and Functional Assessment Average Pain 5   In the last MONTH (on 0-10 scale) has pain interfered with the following?  1. General activity like being  able to carry out your everyday physical activities such as walking, climbing stairs, carrying groceries, or moving a chair?  Rating(3)   +Driver, -BT, -Dye Allergies.  

## 2018-10-18 ENCOUNTER — Other Ambulatory Visit: Payer: Self-pay

## 2018-10-18 ENCOUNTER — Ambulatory Visit (INDEPENDENT_AMBULATORY_CARE_PROVIDER_SITE_OTHER): Payer: Medicare Other | Admitting: Physical Therapy

## 2018-10-18 ENCOUNTER — Encounter: Payer: Self-pay | Admitting: Physical Therapy

## 2018-10-18 DIAGNOSIS — M542 Cervicalgia: Secondary | ICD-10-CM

## 2018-10-18 NOTE — Patient Instructions (Signed)
Access Code: [email or web address]  URL: [email or web address]  Date: 10/18/2018  Prepared by: Lyndee Hensen   Exercises  Seated Cervical Retraction - 10 reps - 1 sets - 2x daily  Standing Backward Shoulder Rolls - 10 reps - 1 sets - 2x daily  Seated Scapular Retraction - 10 reps - 1 sets - 2x daily  Neck Rotation - 5 reps - 1 sets - 2x daily

## 2018-10-18 NOTE — Therapy (Signed)
Home [address] [place], [place], [address] Phone: [phone]   Fax:  [phone]  Physical Therapy Evaluation  Patient Details  Name: Taylor Hopkins MRN: [id number] Date of Birth: [birth year] Referring Provider (PT): Ernestina Patches   Encounter Date: 10/18/2018  PT End of Session - 10/18/18 1551    Visit Number  1    Number of Visits  12    Date for PT Re-Evaluation  11/29/18    Authorization Type  Medicare    PT Start Time  [id number]    PT Stop Time  1053    PT Time Calculation (min)  38 min    Activity Tolerance  Patient tolerated treatment well    Behavior During Therapy  [facility] for tasks assessed/performed       Past Medical History:  Diagnosis Date  . Adenomatous colon polyp 06/2003  . Allergic rhinitis   . Anxiety   . Arthritis   . Cataract 2010   bilateral eyes  . Depression   . GERD (gastroesophageal reflux disease)   . Sleep apnea    Not currently using CPAP - last sleep study 2016    Past Surgical History:  Procedure Laterality Date  . ANKLE SURGERY Right   . BREAST ENHANCEMENT SURGERY    . COLONOSCOPY    . LUMBAR DISC SURGERY    . TONSILLECTOMY    . TOTAL ABDOMINAL HYSTERECTOMY    . UPPER GASTROINTESTINAL ENDOSCOPY      There were no vitals filed for this visit.   Subjective Assessment - 10/18/18 1016    Subjective  Pt states neck pain for about 5 years. Had 1st set of injections last week, states it is helping some. Pain mostly on L side of neck, into L shoulder. Denies numbness/tingling in L UE, but states pain in L UT/shoulder region. 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 She walks 3 miles /day.    Patient Stated Goals  decreased pain    Currently in Pain?  Yes    Pain Score  5     Pain Location  Neck    Pain Orientation  Left    Pain Descriptors / Indicators  Aching;Tightness    Pain Type  Chronic pain    Pain Onset  More than a month ago    Pain Frequency   Intermittent    Aggravating Factors   Turning head, flexion, carrying purse    Pain Relieving Factors  heat,         [facility] PT Assessment - 10/18/18 0001      Assessment   Medical Diagnosis  Cervical Spondylosis    Referring Provider (PT)  [place]    Hand Dominance  Right    Prior Therapy  For Back      Balance Screen   Has the patient fallen in the past 6 months  No      Prior Function   Level of Independence  Independent      Cognition   Overall Cognitive Status  Within Functional Limits for tasks assessed      ROM / Strength   AROM / PROM / Strength  AROM;Strength      AROM   Overall AROM Comments  Cervical: flexion: mild limitation with pain, Ext: WFL, Rotation: mild limtation bilaterally, with pain on L with L and R rotation.  Shoulder AROM: WNL      Strength   Overall Strength Comments  Shoulder: 4/5;  Scapular: 4-/5       Palpation   Palpation comment  Pain at L cervical region, Pain with PA mobs of mid and lower c-spine, TIghtness in L UT vs R.       Special Tests   Other special tests  Neg ULTT,                 Objective measurements completed on examination: See above findings.      [facility] Adult PT Treatment/Exercise - 10/18/18 0001      Exercises   Exercises  Neck      Neck Exercises: Seated   Neck Retraction  10 reps    Cervical Rotation  5 reps    Shoulder Rolls  10 reps    Postural Training  scap retract x10;              PT Education - 10/18/18 1021    Education Details  HEP, PT POC,  exam finding.    Person(s) Educated  Patient    Methods  Explanation;Demonstration;Handout;Verbal cues    Comprehension  Verbalized understanding;Returned demonstration;Need further instruction;Verbal cues required       PT Short Term Goals - 10/18/18 1552      PT SHORT TERM GOAL #1   Title  Pt to be indepenent with initial HEP    Time  2    Period  Weeks    Status  New    Target Date  11/01/18        PT Long Term Goals - 10/18/18  1553      PT LONG TERM GOAL #1   Title  Pt to be independent with final HEP    Time  6    Period  Weeks    Status  New    Target Date  11/29/18      PT LONG TERM GOAL #2   Title  Pt to demo improved cervical rotation, to be [facility] for pt age, with pain 0-1/10 at end range, to improve ability for head turns and driving.    Time  6    Period  Weeks    Status  New    Target Date  11/29/18      PT LONG TERM GOAL #3   Title  Pt to demo soft tissue restrictions to be WNL in cervical musculature, to decrease pain and improve ROM.    Time  6    Period  Weeks    Status  New    Target Date  11/29/18      PT LONG TERM GOAL #4   Title  Pt to demo decreased pain in neck, to 0-2/10 with activity.    Time  6    Period  Weeks    Status  New    Target Date  11/29/18             Plan - 10/18/18 1555    Clinical Impression Statement  Pt presents with primary complaint of increased pain in cervical region. Pt with decreased cervical ROM, with increased pain with rotation. She has increased pain on L, and increased muscle tightness on same side. Pt with decreased posture, and decreased strength of Shoulder and scapular region. Pt with decreased ability for full functional activiteis, due to pain. Pt to benefit from m skilled PT to improve deficits and pain.    Examination-Activity Limitations  Reach Overhead;Carry;Lift    Examination-Participation Restrictions  Meal Prep;Cleaning;Community Activity;Driving;Laundry;Yard Work    Risk analyst  Making  Stable/Uncomplicated    Clinical Decision Making  Low    Rehab Potential  Good    PT Frequency  2x / week    PT Duration  6 weeks    PT Treatment/Interventions  ADLs/Self Care Home Management;Cryotherapy;Electrical Stimulation;DME Instruction;Ultrasound;Traction;Moist Heat;Iontophoresis 4mg /ml Dexamethasone;Functional mobility training;Therapeutic activities;Therapeutic exercise;Neuromuscular re-education;Manual  techniques;Patient/family education;Passive range of motion;Dry needling;Taping;Spinal Manipulations;Joint Manipulations       Patient will benefit from skilled therapeutic intervention in order to improve the following deficits and impairments:  Decreased range of motion, Increased muscle spasms, Decreased activity tolerance, Pain, Improper body mechanics, Impaired flexibility, Decreased mobility, Decreased strength  Visit Diagnosis: Cervicalgia     Problem List Patient Active Problem List   Diagnosis Date Noted  . S/P dilatation of esophageal stricture 07/23/2018  . Anxiety 07/23/2018  . Personal history of colonic polyps, followed by Dr. Fuller Plan 07/04/2018  . IBS (irritable bowel syndrome) 07/04/2018  . Pure hypercholesterolemia 07/04/2018  . Chronic neck pain due to DJD, DDD, unable to tolerate NSAIDs 07/04/2018  . Vitamin D deficiency, on daily supplement 07/04/2018  . Insomnia, Rx prn 1/2 Ambien 07/04/2018  . Osteoporosis, previously on bisphosphonate, with gastritis/dyspepsia 07/04/2018  . Allergic rhinitis, Rx Nasocort 07/04/2018  . Depression, Rx Celexa 07/04/2018  . OSA (obstructive sleep apnea), not on CPAP 07/04/2018  . GERD (gastroesophageal reflux disease) 10/02/2008    Lyndee Hensen, PT, DPT 3:59 PM  10/18/18    [facility] [place], [place], [address] Phone: [phone]   Fax:  [phone]  Name: Taylor Hopkins MRN: [id number] Date of Birth: [birth year]

## 2018-10-19 ENCOUNTER — Other Ambulatory Visit: Payer: Self-pay | Admitting: Family Medicine

## 2018-10-19 DIAGNOSIS — R1013 Epigastric pain: Secondary | ICD-10-CM

## 2018-10-21 ENCOUNTER — Encounter: Payer: Self-pay | Admitting: Physical Therapy

## 2018-10-21 ENCOUNTER — Other Ambulatory Visit: Payer: Self-pay

## 2018-10-21 ENCOUNTER — Ambulatory Visit (INDEPENDENT_AMBULATORY_CARE_PROVIDER_SITE_OTHER): Payer: Medicare Other | Admitting: Physical Therapy

## 2018-10-21 DIAGNOSIS — M542 Cervicalgia: Secondary | ICD-10-CM | POA: Diagnosis not present

## 2018-10-22 ENCOUNTER — Encounter: Payer: Self-pay | Admitting: Physical Therapy

## 2018-10-22 NOTE — Therapy (Signed)
[facility] [address] [place], [place], [address] Phone: [phone]   Fax:  [phone]  Physical Therapy Treatment  Patient Details  Name: Taylor Hopkins MRN: [id number] Date of Birth: 28-Oct-1950 Referring Provider (PT): Ernestina Patches   Encounter Date: 10/21/2018  PT End of Session - 10/22/18 1449    Visit Number  2    Number of Visits  12    Date for PT Re-Evaluation  11/29/18    Authorization Type  Medicare    PT Start Time  0932    PT Stop Time  1015    PT Time Calculation (min)  43 min    Activity Tolerance  Patient tolerated treatment well    Behavior During Therapy  [facility] for tasks assessed/performed       Past Medical History:  Diagnosis Date  . Adenomatous colon polyp 06/2003  . Allergic rhinitis   . Anxiety   . Arthritis   . Cataract 2010   bilateral eyes  . Depression   . GERD (gastroesophageal reflux disease)   . Sleep apnea    Not currently using CPAP - last sleep study 2016    Past Surgical History:  Procedure Laterality Date  . ANKLE SURGERY Right   . BREAST ENHANCEMENT SURGERY    . COLONOSCOPY    . LUMBAR DISC SURGERY    . TONSILLECTOMY    . TOTAL ABDOMINAL HYSTERECTOMY    . UPPER GASTROINTESTINAL ENDOSCOPY      There were no vitals filed for this visit.  Subjective Assessment - 10/21/18 0935    Subjective  Pt states doing ok today, was able to try HEP.    Patient Stated Goals  decreased pain    Currently in Pain?  Yes    Pain Score  4     Pain Location  Neck    Pain Orientation  Left    Pain Descriptors / Indicators  Aching;Tightness    Pain Type  Chronic pain    Pain Onset  More than a month ago    Pain Frequency  Intermittent                                 PT Short Term Goals - 10/18/18 1552      PT SHORT TERM GOAL #1   Title  Pt to be indepenent with initial HEP    Time  2    Period  Weeks    Status  New    Target Date  11/01/18        PT Long Term  Goals - 10/18/18 1553      PT LONG TERM GOAL #1   Title  Pt to be independent with final HEP    Time  6    Period  Weeks    Status  New    Target Date  11/29/18      PT LONG TERM GOAL #2   Title  Pt to demo improved cervical rotation, to be [facility] for pt age, with pain 0-1/10 at end range, to improve ability for head turns and driving.    Time  6    Period  Weeks    Status  New    Target Date  11/29/18      PT LONG TERM GOAL #3   Title  Pt to demo soft tissue restrictions to be WNL in cervical musculature, to decrease pain and  improve ROM.    Time  6    Period  Weeks    Status  New    Target Date  11/29/18      PT LONG TERM GOAL #4   Title  Pt to demo decreased pain in neck, to 0-2/10 with activity.    Time  6    Period  Weeks    Status  New    Target Date  11/29/18            Plan - 10/22/18 1454    Clinical Impression Statement  Manual and ther ex done today for decreasing muscle tightness and improving ROM. Pt with improved L rotation after session today. Pt will benefit from continued manual and ther ex for postural strengthening.    Examination-Activity Limitations  Reach Overhead;Carry;Lift    Examination-Participation Restrictions  Meal Prep;Cleaning;Community Activity;Driving;Laundry;Yard Work    Stability/Clinical Decision Making  Stable/Uncomplicated    Rehab Potential  Good    PT Frequency  2x / week    PT Duration  6 weeks    PT Treatment/Interventions  ADLs/Self Care Home Management;Cryotherapy;Electrical Stimulation;DME Instruction;Ultrasound;Traction;Moist Heat;Iontophoresis 4mg /ml Dexamethasone;Functional mobility training;Therapeutic activities;Therapeutic exercise;Neuromuscular re-education;Manual techniques;Patient/family education;Passive range of motion;Dry needling;Taping;Spinal Manipulations;Joint Manipulations       Patient will benefit from skilled therapeutic intervention in order to improve the following deficits and impairments:  Decreased  range of motion, Increased muscle spasms, Decreased activity tolerance, Pain, Improper body mechanics, Impaired flexibility, Decreased mobility, Decreased strength  Visit Diagnosis: Cervicalgia     Problem List Patient Active Problem List   Diagnosis Date Noted  . S/P dilatation of esophageal stricture 07/23/2018  . Anxiety 07/23/2018  . Personal history of colonic polyps, followed by Dr. Fuller Plan 07/04/2018  . IBS (irritable bowel syndrome) 07/04/2018  . Pure hypercholesterolemia 07/04/2018  . Chronic neck pain due to DJD, DDD, unable to tolerate NSAIDs 07/04/2018  . Vitamin D deficiency, on daily supplement 07/04/2018  . Insomnia, Rx prn 1/2 Ambien 07/04/2018  . Osteoporosis, previously on bisphosphonate, with gastritis/dyspepsia 07/04/2018  . Allergic rhinitis, Rx Nasocort 07/04/2018  . Depression, Rx Celexa 07/04/2018  . OSA (obstructive sleep apnea), not on CPAP 07/04/2018  . GERD (gastroesophageal reflux disease) 10/02/2008    Lyndee Hensen, PT, DPT 2:55 PM  10/22/18    [facility] [place], [place], [address] Phone: [phone]   Fax:  [phone]  Name: KHLOEY PELLETT MRN: [id number] Date of Birth: 06-23-1950

## 2018-10-25 ENCOUNTER — Ambulatory Visit (INDEPENDENT_AMBULATORY_CARE_PROVIDER_SITE_OTHER): Payer: Medicare Other | Admitting: Physical Therapy

## 2018-10-25 ENCOUNTER — Encounter: Payer: Self-pay | Admitting: Physical Therapy

## 2018-10-25 ENCOUNTER — Other Ambulatory Visit: Payer: Self-pay

## 2018-10-25 DIAGNOSIS — M542 Cervicalgia: Secondary | ICD-10-CM | POA: Diagnosis not present

## 2018-10-25 NOTE — Therapy (Signed)
[facility] [address] [place], [place], [address] Phone: [phone]   Fax:  [phone]  Physical Therapy Treatment  Patient Details  Name: Taylor Hopkins MRN: [id number] Date of Birth: 1950/09/17 Referring Provider (Taylor Hopkins): Ernestina Patches   Encounter Date: 10/25/2018  Taylor Hopkins End of Session - 10/25/18 1134    Visit Number  3    Number of Visits  12    Date for Taylor Hopkins Re-Evaluation  11/29/18    Authorization Type  Medicare    Taylor Hopkins Start Time  0935    Taylor Hopkins Stop Time  1015    Taylor Hopkins Time Calculation (min)  40 min    Activity Tolerance  Patient tolerated treatment well    Behavior During Therapy  [facility] for tasks assessed/performed       Past Medical History:  Diagnosis Date  . Adenomatous colon polyp 06/2003  . Allergic rhinitis   . Anxiety   . Arthritis   . Cataract 2010   bilateral eyes  . Depression   . GERD (gastroesophageal reflux disease)   . Sleep apnea    Not currently using CPAP - last sleep study 2016    Past Surgical History:  Procedure Laterality Date  . ANKLE SURGERY Right   . BREAST ENHANCEMENT SURGERY    . COLONOSCOPY    . LUMBAR DISC SURGERY    . TONSILLECTOMY    . TOTAL ABDOMINAL HYSTERECTOMY    . UPPER GASTROINTESTINAL ENDOSCOPY      There were no vitals filed for this visit.  Subjective Assessment - 10/25/18 1134    Subjective  Taylor Hopkins states "cramping" in upper back after doing some of the scap retraction. She has less pain in neck, but still sore with L rotation.    Currently in Pain?  Yes    Pain Score  4     Pain Location  Neck    Pain Orientation  Left    Pain Descriptors / Indicators  Aching;Tightness    Pain Type  Chronic pain    Pain Onset  More than a month ago    Pain Frequency  Intermittent                       [facility] Adult Taylor Hopkins Treatment/Exercise - 10/25/18 0902      Neck Exercises: Theraband   Rows  Red;20 reps    Horizontal ABduction  20 reps    Horizontal ABduction Limitations  2 lb  weights bil      Neck Exercises: Seated   Neck Retraction  20 reps    Cervical Rotation  5 reps    Cervical Rotation Limitations  with OP    Postural Training  --    Other Seated Exercise  Shoulder pulley x2 min/Flexion;       Neck Exercises: Supine   Neck Retraction  15 reps      Manual Therapy   Manual Therapy  Joint mobilization;Soft tissue mobilization;Passive ROM;Manual Traction    Joint Mobilization  thoracic and cervical PA mobs, side glides, Unilateral PA (seated) to increase L rotation    Soft tissue mobilization  DTM to Bil UT and paraspinals, SOR     Manual Traction  cervical: 10 sec x8      Neck Exercises: Stretches   Upper Trapezius Stretch  30 seconds;Right;Left;3 reps    Levator Stretch  --    Lower Cervical/Upper Thoracic Stretch  3 reps;30 seconds   seated  Taylor Hopkins Short Term Goals - 10/18/18 1552      Taylor Hopkins SHORT TERM GOAL #1   Title  Taylor Hopkins to be indepenent with initial HEP    Time  2    Period  Weeks    Status  New    Target Date  11/01/18        Taylor Hopkins Long Term Goals - 10/18/18 1553      Taylor Hopkins LONG TERM GOAL #1   Title  Taylor Hopkins to be independent with final HEP    Time  6    Period  Weeks    Status  New    Target Date  11/29/18      Taylor Hopkins LONG TERM GOAL #2   Title  Taylor Hopkins to demo improved cervical rotation, to be [facility] for Taylor Hopkins age, with pain 0-1/10 at end range, to improve ability for head turns and driving.    Time  6    Period  Weeks    Status  New    Target Date  11/29/18      Taylor Hopkins LONG TERM GOAL #3   Title  Taylor Hopkins to demo soft tissue restrictions to be WNL in cervical musculature, to decrease pain and improve ROM.    Time  6    Period  Weeks    Status  New    Target Date  11/29/18      Taylor Hopkins LONG TERM GOAL #4   Title  Taylor Hopkins to demo decreased pain in neck, to 0-2/10 with activity.    Time  6    Period  Weeks    Status  New    Target Date  11/29/18            Plan - 10/25/18 1135    Clinical Impression Statement  Taylor Hopkins with improved cervical  rotation, in both directions after manual today. Taylor Hopkins with more difficulty with full rotation in sitting vs supine. She will benefit from DN next visit for muscle tightness in cervical region, to improve ROM. Plan to progress postural strengthening as able.    Examination-Activity Limitations  Reach Overhead;Carry;Lift    Examination-Participation Restrictions  Meal Prep;Cleaning;Community Activity;Driving;Laundry;Yard Work    Stability/Clinical Decision Making  Stable/Uncomplicated    Rehab Potential  Good    Taylor Hopkins Frequency  2x / week    Taylor Hopkins Duration  6 weeks    Taylor Hopkins Treatment/Interventions  ADLs/Self Care Home Management;Cryotherapy;Electrical Stimulation;DME Instruction;Ultrasound;Traction;Moist Heat;Iontophoresis 4mg /ml Dexamethasone;Functional mobility training;Therapeutic activities;Therapeutic exercise;Neuromuscular re-education;Manual techniques;Patient/family education;Passive range of motion;Dry needling;Taping;Spinal Manipulations;Joint Manipulations    Consulted and Agree with Plan of Care  Patient       Patient will benefit from skilled therapeutic intervention in order to improve the following deficits and impairments:  Decreased range of motion, Increased muscle spasms, Decreased activity tolerance, Pain, Improper body mechanics, Impaired flexibility, Decreased mobility, Decreased strength  Visit Diagnosis: Cervicalgia     Problem List Patient Active Problem List   Diagnosis Date Noted  . S/P dilatation of esophageal stricture 07/23/2018  . Anxiety 07/23/2018  . Personal history of colonic polyps, followed by Dr. Fuller Plan 07/04/2018  . IBS (irritable bowel syndrome) 07/04/2018  . Pure hypercholesterolemia 07/04/2018  . Chronic neck pain due to DJD, DDD, unable to tolerate NSAIDs 07/04/2018  . Vitamin D deficiency, on daily supplement 07/04/2018  . Insomnia, Rx prn 1/2 Ambien 07/04/2018  . Osteoporosis, previously on bisphosphonate, with gastritis/dyspepsia 07/04/2018  . Allergic  rhinitis, Rx Nasocort 07/04/2018  . Depression, Rx Celexa 07/04/2018  . OSA (obstructive sleep  apnea), not on CPAP 07/04/2018  . GERD (gastroesophageal reflux disease) 10/02/2008   Taylor Hopkins, Taylor Hopkins, Taylor Hopkins 11:40 AM  10/25/18    [facility] [place], [place], [address] Phone: [phone]   Fax:  [phone]  Name: Taylor Hopkins MRN: [id number] Date of Birth: May 25, 1950

## 2018-10-27 ENCOUNTER — Other Ambulatory Visit: Payer: Self-pay

## 2018-10-27 ENCOUNTER — Ambulatory Visit (INDEPENDENT_AMBULATORY_CARE_PROVIDER_SITE_OTHER): Payer: Medicare Other | Admitting: Physical Therapy

## 2018-10-27 ENCOUNTER — Encounter: Payer: Self-pay | Admitting: Physical Therapy

## 2018-10-27 DIAGNOSIS — M542 Cervicalgia: Secondary | ICD-10-CM

## 2018-11-02 ENCOUNTER — Ambulatory Visit (INDEPENDENT_AMBULATORY_CARE_PROVIDER_SITE_OTHER): Payer: Medicare Other | Admitting: Physical Therapy

## 2018-11-02 ENCOUNTER — Encounter: Payer: Self-pay | Admitting: Physical Therapy

## 2018-11-02 ENCOUNTER — Other Ambulatory Visit: Payer: Self-pay

## 2018-11-02 DIAGNOSIS — M542 Cervicalgia: Secondary | ICD-10-CM | POA: Diagnosis not present

## 2018-11-02 NOTE — Therapy (Signed)
[facility] [address] [place], [place], [address] Phone: [phone]   Fax:  [phone]  Physical Therapy Treatment  Patient Details  Name: Taylor Hopkins MRN: [id number] Date of Birth: 03-19-1950 Referring Provider (PT): Ernestina Patches   Encounter Date: 10/27/2018  PT End of Session - 11/02/18 0954    Visit Number  4    Number of Visits  12    Date for PT Re-Evaluation  11/29/18    Authorization Type  Medicare    PT Start Time  1341    PT Stop Time  1420    PT Time Calculation (min)  39 min    Activity Tolerance  Patient tolerated treatment well    Behavior During Therapy  [employer] for tasks assessed/performed       Past Medical History:  Diagnosis Date  . Adenomatous colon polyp 06/2003  . Allergic rhinitis   . Anxiety   . Arthritis   . Cataract 2010   bilateral eyes  . Depression   . GERD (gastroesophageal reflux disease)   . Sleep apnea    Not currently using CPAP - last sleep study 2016    Past Surgical History:  Procedure Laterality Date  . ANKLE SURGERY Right   . BREAST ENHANCEMENT SURGERY    . COLONOSCOPY    . LUMBAR DISC SURGERY    . TONSILLECTOMY    . TOTAL ABDOMINAL HYSTERECTOMY    . UPPER GASTROINTESTINAL ENDOSCOPY      There were no vitals filed for this visit.  Subjective Assessment - 11/02/18 0953    Subjective  Pt states some improvement of pain in neck, but still sore with rotation.    Currently in Pain?  Yes    Pain Score  3     Pain Location  Neck    Pain Orientation  Left    Pain Descriptors / Indicators  Aching;Tightness    Pain Onset  More than a month ago    Pain Frequency  Intermittent                       [facility] Adult PT Treatment/Exercise - 11/02/18 0001      Neck Exercises: Machines for Strengthening   UBE (Upper Arm Bike)  L1 x 4 min fwd/bwd      Neck Exercises: Theraband   Rows  20 reps;Green    Horizontal ABduction  20 reps    Horizontal ABduction Limitations   standing RTB      Neck Exercises: Seated   Neck Retraction  20 reps    Cervical Rotation  5 reps;Both      Manual Therapy   Manual Therapy  Joint mobilization;Soft tissue mobilization;Passive ROM;Manual Traction    Manual therapy comments  skilled palpation and monitoring of soft tissue during dry needling.     Joint Mobilization  thoracic and cervical PA mobs, side glides, Unilateral PA (seated) to increase L rotation    Soft tissue mobilization  DTM to Bil UT and paraspinals,     Manual Traction  cervical: 10 sec x8      Neck Exercises: Stretches   Upper Trapezius Stretch  30 seconds;Right;Left;3 reps    Lower Cervical/Upper Thoracic Stretch  3 reps;30 seconds   seated      Trigger Point Dry Needling - 11/02/18 0001    Consent Given?  Yes    Education Handout Provided  Yes    Muscles Treated Head and Neck  Upper trapezius;Splenius  capitus    Upper Trapezius Response  Twitch reponse elicited;Palpable increased muscle length   L   Splenius capitus Response  Palpable increased muscle length   L            PT Short Term Goals - 10/18/18 1552      PT SHORT TERM GOAL #1   Title  Pt to be indepenent with initial HEP    Time  2    Period  Weeks    Status  New    Target Date  11/01/18        PT Long Term Goals - 10/18/18 1553      PT LONG TERM GOAL #1   Title  Pt to be independent with final HEP    Time  6    Period  Weeks    Status  New    Target Date  11/29/18      PT LONG TERM GOAL #2   Title  Pt to demo improved cervical rotation, to be [facility] for pt age, with pain 0-1/10 at end range, to improve ability for head turns and driving.    Time  6    Period  Weeks    Status  New    Target Date  11/29/18      PT LONG TERM GOAL #3   Title  Pt to demo soft tissue restrictions to be WNL in cervical musculature, to decrease pain and improve ROM.    Time  6    Period  Weeks    Status  New    Target Date  11/29/18      PT LONG TERM GOAL #4   Title  Pt to demo  decreased pain in neck, to 0-2/10 with activity.    Time  6    Period  Weeks    Status  New    Target Date  11/29/18            Plan - 11/02/18 0955    Clinical Impression Statement  Pt with improving rotation with mobilizations. Pt with good response from DN today, for lower c-spine and UT, may benefit from future sessions for mid/upper c-spine musculature. Pt showing improvemets, but continues to have discomfort with L end range rotation.    Examination-Activity Limitations  Reach Overhead;Carry;Lift    Examination-Participation Restrictions  Meal Prep;Cleaning;Community Activity;Driving;Laundry;Yard Work    Stability/Clinical Decision Making  Stable/Uncomplicated    Rehab Potential  Good    PT Frequency  2x / week    PT Duration  6 weeks    PT Treatment/Interventions  ADLs/Self Care Home Management;Cryotherapy;Electrical Stimulation;DME Instruction;Ultrasound;Traction;Moist Heat;Iontophoresis 4mg /ml Dexamethasone;Functional mobility training;Therapeutic activities;Therapeutic exercise;Neuromuscular re-education;Manual techniques;Patient/family education;Passive range of motion;Dry needling;Taping;Spinal Manipulations;Joint Manipulations    Consulted and Agree with Plan of Care  Patient       Patient will benefit from skilled therapeutic intervention in order to improve the following deficits and impairments:  Decreased range of motion, Increased muscle spasms, Decreased activity tolerance, Pain, Improper body mechanics, Impaired flexibility, Decreased mobility, Decreased strength  Visit Diagnosis: Cervicalgia     Problem List Patient Active Problem List   Diagnosis Date Noted  . S/P dilatation of esophageal stricture 07/23/2018  . Anxiety 07/23/2018  . Personal history of colonic polyps, followed by Dr. Fuller Plan 07/04/2018  . IBS (irritable bowel syndrome) 07/04/2018  . Pure hypercholesterolemia 07/04/2018  . Chronic neck pain due to DJD, DDD, unable to tolerate NSAIDs  07/04/2018  . Vitamin D deficiency, on daily supplement  07/04/2018  . Insomnia, Rx prn 1/2 Ambien 07/04/2018  . Osteoporosis, previously on bisphosphonate, with gastritis/dyspepsia 07/04/2018  . Allergic rhinitis, Rx Nasocort 07/04/2018  . Depression, Rx Celexa 07/04/2018  . OSA (obstructive sleep apnea), not on CPAP 07/04/2018  . GERD (gastroesophageal reflux disease) 10/02/2008   Lyndee Hensen, PT, DPT 9:56 AM  11/02/18    [facility] [place], [place], [address] Phone: [phone]   Fax:  [phone]  Name: SHIVYA SCHRAM MRN: [id number] Date of Birth: January 07, 1951
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
## 2018-11-04 ENCOUNTER — Other Ambulatory Visit: Payer: Self-pay

## 2018-11-04 ENCOUNTER — Ambulatory Visit (INDEPENDENT_AMBULATORY_CARE_PROVIDER_SITE_OTHER): Payer: Medicare Other | Admitting: Physical Therapy

## 2018-11-04 ENCOUNTER — Encounter: Payer: Self-pay | Admitting: Physical Therapy

## 2018-11-04 DIAGNOSIS — M542 Cervicalgia: Secondary | ICD-10-CM
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
## 2018-11-09 ENCOUNTER — Encounter: Payer: Self-pay | Admitting: Physical Therapy

## 2018-11-09 ENCOUNTER — Other Ambulatory Visit: Payer: Self-pay

## 2018-11-09 ENCOUNTER — Ambulatory Visit (INDEPENDENT_AMBULATORY_CARE_PROVIDER_SITE_OTHER): Payer: Medicare Other | Admitting: Physical Therapy

## 2018-11-09 DIAGNOSIS — M542 Cervicalgia: Secondary | ICD-10-CM | POA: Diagnosis not present

## 2018-11-11 ENCOUNTER — Telehealth: Payer: Self-pay

## 2018-11-11 ENCOUNTER — Other Ambulatory Visit: Payer: Self-pay

## 2018-11-11 ENCOUNTER — Encounter: Payer: Self-pay | Admitting: Physical Therapy

## 2018-11-11 ENCOUNTER — Ambulatory Visit (INDEPENDENT_AMBULATORY_CARE_PROVIDER_SITE_OTHER): Payer: Medicare Other | Admitting: Physical Therapy

## 2018-11-11 DIAGNOSIS — M542 Cervicalgia: Secondary | ICD-10-CM | POA: Diagnosis not present

## 2018-11-11 NOTE — Telephone Encounter (Signed)
Left message to return call to our office.  
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
## 2018-11-11 NOTE — Telephone Encounter (Signed)
Patient returning call from Joellen. Please advise.  

## 2018-11-11 NOTE — Therapy (Signed)
[facility] [address] [place], [place], [address] Phone: [phone]   Fax:  [phone]  Physical Therapy Treatment  Patient Details  Name: Taylor Hopkins MRN: [id number] Date of Birth: Aug 22, 1950 Referring Provider (PT): Ernestina Patches   Encounter Date: 11/11/2018  PT End of Session - 11/11/18 1235    Visit Number  8    Number of Visits  12    Date for PT Re-Evaluation  11/29/18    Authorization Type  Medicare    PT Start Time  0932    PT Stop Time  1015    PT Time Calculation (min)  43 min    Activity Tolerance  Patient tolerated treatment well    Behavior During Therapy  [facility] for tasks assessed/performed       Past Medical History:  Diagnosis Date  . Adenomatous colon polyp 06/2003  . Allergic rhinitis   . Anxiety   . Arthritis   . Cataract 2010   bilateral eyes  . Depression   . GERD (gastroesophageal reflux disease)   . Sleep apnea    Not currently using CPAP - last sleep study 2016    Past Surgical History:  Procedure Laterality Date  . ANKLE SURGERY Right   . BREAST ENHANCEMENT SURGERY    . COLONOSCOPY    . LUMBAR DISC SURGERY    . TONSILLECTOMY    . TOTAL ABDOMINAL HYSTERECTOMY    . UPPER GASTROINTESTINAL ENDOSCOPY      There were no vitals filed for this visit.  Subjective Assessment - 11/11/18 1233    Subjective  Pt states improved soreness today, neck still sore on L side.    Patient Stated Goals  decreased pain    Currently in Pain?  Yes    Pain Score  3     Pain Location  Neck    Pain Orientation  Left    Pain Descriptors / Indicators  Aching;Tightness    Pain Type  Chronic pain    Pain Onset  More than a month ago    Pain Frequency  Intermittent                       [facility] Adult PT Treatment/Exercise - 11/11/18 0940      Neck Exercises: Machines for Strengthening   UBE (Upper Arm Bike)  L1 x 4 min total  (fwd/bwd)       Neck Exercises: Theraband   Rows  20 reps;Green    Shoulder External Rotation  20 reps;Red    Shoulder External Rotation Limitations  bil ER      Neck Exercises: Standing   Other Standing Exercises  UE flex and abd x15 each , with education on posture.      Neck Exercises: Seated   Neck Retraction  --    Other Seated Exercise  --      Neck Exercises: Supine   Other Supine Exercise  supine horizontal Abd x20; 2lb      Manual Therapy   Manual Therapy  Joint mobilization;Soft tissue mobilization;Passive ROM;Manual Traction    Manual therapy comments  skilled palpation and monitoring of soft tissue during dry needling.     Joint Mobilization  thoracic and cervical PA mobs, side glides, MWM to improve L rotation    Soft tissue mobilization  DTM and IASTM to L Rhomboid, levator and UT,     Manual Traction  --      Neck Exercises: Stretches  Upper Trapezius Stretch  30 seconds;Right;Left;3 reps    Lower Cervical/Upper Thoracic Stretch  3 reps;30 seconds   seated   Other Neck Stretches  Post shoulder stretch 30 sec x3;        Trigger Point Dry Needling - 11/11/18 1247    Consent Given?  Yes    Education Handout Provided  Previously provided    Muscles Treated Upper Quadrant  Rhomboids;Infraspinatus;Teres major;Teres minor    Rhomboids Response  Palpable increased muscle length;Twitch response elicited    Infraspinatus Response  Palpable increased muscle length    Teres major Response  Palpable increased muscle length;Twitch response elicited    Teres minor Response  Palpable increased muscle length;Twitch response elicited             PT Short Term Goals - 11/11/18 0752      PT SHORT TERM GOAL #1   Title  Pt to be indepenent with initial HEP    Time  2    Period  Weeks    Status  Achieved    Target Date  11/01/18        PT Long Term Goals - 10/18/18 1553      PT LONG TERM GOAL #1   Title  Pt to be independent with final HEP    Time  6    Period  Weeks    Status  New    Target Date  11/29/18      PT LONG TERM  GOAL #2   Title  Pt to demo improved cervical rotation, to be [facility] for pt age, with pain 0-1/10 at end range, to improve ability for head turns and driving.    Time  6    Period  Weeks    Status  New    Target Date  11/29/18      PT LONG TERM GOAL #3   Title  Pt to demo soft tissue restrictions to be WNL in cervical musculature, to decrease pain and improve ROM.    Time  6    Period  Weeks    Status  New    Target Date  11/29/18      PT LONG TERM GOAL #4   Title  Pt to demo decreased pain in neck, to 0-2/10 with activity.    Time  6    Period  Weeks    Status  New    Target Date  11/29/18            Plan - 11/11/18 1248    Clinical Impression Statement  Pt with improving pain in thoracic/rhomboid region since last visit. Does have painful trigger points, DN done for this today, in rhomboid and posterior shoulder. Pt with good response. She will benefit from continued care , will work towards d/c as pain improves.    Examination-Activity Limitations  Reach Overhead;Carry;Lift    Examination-Participation Restrictions  Meal Prep;Cleaning;Community Activity;Driving;Laundry;Yard Work    Stability/Clinical Decision Making  Stable/Uncomplicated    Rehab Potential  Good    PT Frequency  2x / week    PT Duration  6 weeks    PT Treatment/Interventions  ADLs/Self Care Home Management;Cryotherapy;Electrical Stimulation;DME Instruction;Ultrasound;Traction;Moist Heat;Iontophoresis 4mg /ml Dexamethasone;Functional mobility training;Therapeutic activities;Therapeutic exercise;Neuromuscular re-education;Manual techniques;Patient/family education;Passive range of motion;Dry needling;Taping;Spinal Manipulations;Joint Manipulations    Consulted and Agree with Plan of Care  Patient       Patient will benefit from skilled therapeutic intervention in order to improve the following deficits and impairments:  Decreased range  of motion, Increased muscle spasms, Decreased activity tolerance, Pain,  Improper body mechanics, Impaired flexibility, Decreased mobility, Decreased strength  Visit Diagnosis: Cervicalgia     Problem List Patient Active Problem List   Diagnosis Date Noted  . S/P dilatation of esophageal stricture 07/23/2018  . Anxiety 07/23/2018  . Personal history of colonic polyps, followed by Dr. Fuller Plan 07/04/2018  . IBS (irritable bowel syndrome) 07/04/2018  . Pure hypercholesterolemia 07/04/2018  . Chronic neck pain due to DJD, DDD, unable to tolerate NSAIDs 07/04/2018  . Vitamin D deficiency, on daily supplement 07/04/2018  . Insomnia, Rx prn 1/2 Ambien 07/04/2018  . Osteoporosis, previously on bisphosphonate, with gastritis/dyspepsia 07/04/2018  . Allergic rhinitis, Rx Nasocort 07/04/2018  . Depression, Rx Celexa 07/04/2018  . OSA (obstructive sleep apnea), not on CPAP 07/04/2018  . GERD (gastroesophageal reflux disease) 10/02/2008   Lyndee Hensen, PT, DPT 12:50 PM  11/11/18     [facility] [place], [place], [address] Phone: [phone]   Fax:  [phone]  Name: Taylor Hopkins MRN: [id number] Date of Birth: 22-Aug-1950

## 2018-11-11 NOTE — Telephone Encounter (Signed)
Pt stated she is out of zolpidem (AMBIEN) 10 MG tablet. Pt stated she was going from 10mg  to 5mg  and it was not working. Pt stated she is out now and did not sleep at all last night. Please advise.

## 2018-11-11 NOTE — Telephone Encounter (Signed)
See note

## 2018-11-12 ENCOUNTER — Telehealth: Payer: Self-pay | Admitting: Family Medicine

## 2018-11-12 NOTE — Telephone Encounter (Signed)
Please advise 

## 2018-11-12 NOTE — Telephone Encounter (Signed)
Medication Refill - Medication: zolpidem (AMBIEN) 10 MG tablet  Pt needs refill of 10 mg tablets, she states that 5 mg (halves) are not working well enough. Please advise, pt is out and pharmacy cannot fill until october  Has the patient contacted their pharmacy? Yes.   (Agent: If no, request that the patient contact the pharmacy for the refill.) (Agent: If yes, when and what did the pharmacy advise?)  Preferred Pharmacy (with phone number or street name):  [id number] [address], [place]  [place] [address]  Phone: [phone] Fax: [phone]     Agent: Please be advised that RX refills may take up to 3 business days. We ask that you follow-up with your pharmacy.

## 2018-11-12 NOTE — Telephone Encounter (Signed)
duplicate

## 2018-11-13 MED ORDER — ZOLPIDEM TARTRATE 10 MG PO TABS: 5.0000 mg | ORAL_TABLET | Freq: Every day | ORAL | 3 refills | Status: DC

## 2018-11-16 ENCOUNTER — Other Ambulatory Visit: Payer: Self-pay

## 2018-11-16 ENCOUNTER — Encounter: Payer: Medicare Other | Admitting: Physical Therapy

## 2018-11-18 ENCOUNTER — Encounter: Payer: Self-pay | Admitting: Physical Therapy

## 2018-11-18 ENCOUNTER — Ambulatory Visit (INDEPENDENT_AMBULATORY_CARE_PROVIDER_SITE_OTHER): Payer: Medicare Other | Admitting: Physical Therapy

## 2018-11-18 ENCOUNTER — Other Ambulatory Visit: Payer: Self-pay

## 2018-11-18 DIAGNOSIS — M542 Cervicalgia: Secondary | ICD-10-CM
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
## 2018-11-18 NOTE — Progress Notes (Signed)
[name] is a 68 y.o. female is here for follow up.  History of Present Illness:   HPI: Patient is doing very well.  Her dyspepsia is well controlled with her current medication.  She has had no issues with dysphasia.  She will be due for her next Prolia injection in November.  She feels that PMR epidural injections have been incredibly helpful for her back pain.  Will be seeing our health coach for annual Medicare wellness after our visit today.  Patient was previously taking Ambien 10 mg p.o. nightly.  We decrease that to 5 mg p.o. nightly.  She states that she is having trouble falling asleep.  She admits that she watches the news prior to sleep and that this is likely causing some of her issues.  She is also having 2-3 mild panic attacks per week.  She wonders if it would be okay for her to have a small prescription of low-dose Xanax to use sparingly.  Health Maintenance Due  Topic Date Due  . TETANUS/TDAP  10/02/1969  . PNA vac Low Risk Adult (1 of 2 - PCV13) 10/03/2014  . INFLUENZA VACCINE  09/25/2018   Depression screen [facility] 2/9 11/19/2018 07/22/2018 07/02/2018  Decreased Interest 0 3 1  Down, Depressed, Hopeless 0 3 0  PHQ - 2 Score 0 6 1  Altered sleeping 3 3 2   Tired, decreased energy 1 3 2   Change in appetite 1 3 2   Feeling bad or failure about yourself  1 3 2   Trouble concentrating 0 3 1  Moving slowly or fidgety/restless 0 3 1  Suicidal thoughts 0 0 0  PHQ-9 Score 6 24 11   Difficult doing work/chores Not difficult at all Somewhat difficult Somewhat difficult   PMHx, SurgHx, SocialHx, FamHx, Medications, and Allergies were reviewed in the Visit Navigator and updated as appropriate.   Patient Active Problem List   Diagnosis Date Noted  . S/P dilatation of esophageal stricture 07/23/2018  . Anxiety 07/23/2018  . Personal history of colonic polyps, followed by Dr. Fuller Plan 07/04/2018  . IBS (irritable bowel syndrome) 07/04/2018  . Pure hypercholesterolemia 07/04/2018   . Chronic neck pain due to DJD, DDD, unable to tolerate NSAIDs 07/04/2018  . Vitamin D deficiency, on daily supplement 07/04/2018  . Insomnia, Rx prn 1/2 Ambien 07/04/2018  . Osteoporosis, previously on bisphosphonate, with gastritis/dyspepsia 07/04/2018  . Allergic rhinitis, Rx Nasocort 07/04/2018  . Depression, Rx Celexa 07/04/2018  . OSA (obstructive sleep apnea), not on CPAP 07/04/2018  . GERD (gastroesophageal reflux disease) 10/02/2008   Social History   Tobacco Use  . Smoking status: Never Smoker  . Smokeless tobacco: Never Used  Substance Use Topics  . Alcohol use: Yes    Alcohol/week: 0.0 standard drinks    Comment: 1 per month  . Drug use: No   Current Medications and Allergies   .  BIOTIN PO, Take 3,000 mcg by mouth., Disp: , Rfl:  .  buPROPion (WELLBUTRIN) 75 MG tablet, Take 1 tablet (75 mg total) by mouth 2 (two) times daily., Disp: 180 tablet, Rfl: 0 .  Calcium Citrate-Vitamin D (CALCIUM + D PO), Take by mouth., Disp: , Rfl:  .  citalopram (CELEXA) 40 MG tablet, Take 20 mg by mouth daily. Alternates between 20mg  and 40 mg daily. , Disp: , Rfl:  .  Cyanocobalamin (VITAMIN B12 PO), Take by mouth., Disp: , Rfl:  .  dexlansoprazole (DEXILANT) 60 MG capsule, Take 1 capsule (60 mg total) by mouth daily.,  Disp: 90 capsule, Rfl: 1 .  Probiotic Product (DIGESTIVE ADVANTAGE GUMMIES PO), Take by mouth., Disp: , Rfl:  .  sucralfate (CARAFATE) 1 g tablet, TAKE 1 TABLET BY MOUTH 4 TIMES DAILY - WITH MEALS AND AT BEDTIME. DISSOLVE IN 2 TSP WATER FOR SLURRY, Disp: 360 tablet, Rfl: 0 .  triamcinolone (NASACORT) 55 MCG/ACT AERO nasal inhaler, Place 2 sprays into the nose daily., Disp: 1 Inhaler, Rfl: 0 .  zolpidem (AMBIEN) 10 MG tablet, Take 0.5 tablets (5 mg total) by mouth at bedtime., Disp: 30 tablet, Rfl: 3   Allergies  Allergen Reactions  . Sulfonamide Derivatives   . Adhesive  [Tape] Rash  . Sulfa Antibiotics Rash   Review of Systems   Pertinent items are noted in the  HPI. Otherwise, a complete ROS is negative.  Vitals   Vitals:   11/19/18 0935  BP: 140/88  Pulse: 80  Temp: 97.7 F (36.5 C)  TempSrc: Oral  SpO2: 98%  Weight: 217 lb (98.4 kg)  Height: 5' 6.75" (1.695 m)     Body mass index is 34.24 kg/m.  Physical Exam   Physical Exam Vitals signs and nursing note reviewed.  HENT:     Head: Normocephalic and atraumatic.  Eyes:     Pupils: Pupils are equal, round, and reactive to light.  Neck:     Musculoskeletal: Normal range of motion and neck supple.  Cardiovascular:     Rate and Rhythm: Normal rate and regular rhythm.     Heart sounds: Normal heart sounds.  Pulmonary:     Effort: Pulmonary effort is normal.  Abdominal:     Palpations: Abdomen is soft.  Skin:    General: Skin is warm.  Psychiatric:        Behavior: Behavior normal.     Assessment and Plan   Taylor Hopkins was seen today for follow-up.  Diagnoses and all orders for this visit:  Gastroesophageal reflux disease with esophagitis -     sucralfate (CARAFATE) 1 g tablet; TAKE 1 TABLET BY MOUTH 4 TIMES DAILY - WITH MEALS AND AT BEDTIME. DISSOLVE IN 2 TSP WATER FOR SLURRY -     dexlansoprazole (DEXILANT) 60 MG capsule; Take 1 capsule (60 mg total) by mouth daily.  Need for Tdap vaccination -     Tdap ([place]) 5-2.5-18.5 LF-MCG/0.5 injection; Inject 0.5 mLs into the muscle once for 1 dose.  Need for immunization against influenza -     Flu Vaccine QUAD High Dose(Fluad)  Need for pneumococcal vaccination -     Pneumococcal polysaccharide vaccine 23-valent greater than or equal to 2yo subcutaneous/IM  Age related osteoporosis, Rx Prolia Comments: Due in November.  Primary insomnia Comments: Discussed options. Will trial Trazodone, which will hopefully take the place of Ambien. Orders: -     zolpidem (AMBIEN) 10 MG tablet; Take 0.5 tablets (5 mg total) by mouth at bedtime. -     traZODone (DESYREL) 50 MG tablet; Take 0.5-1 tablets (25-50 mg total) by mouth at  bedtime as needed for sleep.  Panic attack Comments: Okay low dose Xanax. Discussed risks of medication. If not helping or if attacks increase, recommended addressing Celexa/Wellbutrin regimen.  Orders: -     ALPRAZolam (XANAX) 0.5 MG tablet; Take 1 tablet (0.5 mg total) by mouth daily as needed for anxiety.  Depression, Rx Celexa -     buPROPion (WELLBUTRIN) 75 MG tablet; Take 1 tablet (75 mg total) by mouth 2 (two) times daily.  Chronic neck pain due to DJD,  DDD, unable to tolerate NSAIDs Comments: Doing well.    . Orders and follow up as documented in [place], reviewed diet, exercise and weight control, cardiovascular risk and specific lipid/LDL goals reviewed, reviewed medications and side effects in detail.  . Reviewed expectations re: course of current medical issues. . Outlined signs and symptoms indicating need for more acute intervention. . Patient verbalized understanding and all questions were answered. . Patient received an After Visit Summary.  Briscoe Deutscher, DO [place], [facility] 11/19/2018

## 2018-11-19 ENCOUNTER — Ambulatory Visit (INDEPENDENT_AMBULATORY_CARE_PROVIDER_SITE_OTHER): Payer: Medicare Other

## 2018-11-19 ENCOUNTER — Ambulatory Visit (INDEPENDENT_AMBULATORY_CARE_PROVIDER_SITE_OTHER): Payer: Medicare Other | Admitting: Family Medicine

## 2018-11-19 ENCOUNTER — Encounter: Payer: Self-pay | Admitting: Family Medicine

## 2018-11-19 VITALS — BP 140/88 | Ht 66.75 in | Wt 216.9 lb

## 2018-11-19 VITALS — BP 140/88 | HR 80 | Temp 97.7°F | Ht 66.75 in | Wt 217.0 lb

## 2018-11-19 DIAGNOSIS — K2100 Gastro-esophageal reflux disease with esophagitis, without bleeding: Secondary | ICD-10-CM

## 2018-11-19 DIAGNOSIS — F5101 Primary insomnia: Secondary | ICD-10-CM

## 2018-11-19 DIAGNOSIS — F329 Major depressive disorder, single episode, unspecified: Secondary | ICD-10-CM

## 2018-11-19 DIAGNOSIS — F410 Panic disorder [episodic paroxysmal anxiety] without agoraphobia: Secondary | ICD-10-CM | POA: Diagnosis not present

## 2018-11-19 DIAGNOSIS — F325 Major depressive disorder, single episode, in full remission: Secondary | ICD-10-CM

## 2018-11-19 DIAGNOSIS — M810 Age-related osteoporosis without current pathological fracture: Secondary | ICD-10-CM | POA: Diagnosis not present

## 2018-11-19 DIAGNOSIS — Z23 Encounter for immunization: Secondary | ICD-10-CM | POA: Diagnosis not present

## 2018-11-19 DIAGNOSIS — G8929 Other chronic pain: Secondary | ICD-10-CM

## 2018-11-19 DIAGNOSIS — M542 Cervicalgia: Secondary | ICD-10-CM | POA: Diagnosis not present

## 2018-11-19 DIAGNOSIS — Z0000 Encounter for general adult medical examination without abnormal findings: Secondary | ICD-10-CM | POA: Diagnosis not present

## 2018-11-19 MED ORDER — TRAZODONE HCL 50 MG PO TABS: 25.0000 mg | ORAL_TABLET | Freq: Every evening | ORAL | 3 refills | Status: DC | PRN

## 2018-11-19 MED ORDER — SUCRALFATE 1 G PO TABS: ORAL_TABLET | ORAL | 0 refills | Status: DC

## 2018-11-19 MED ORDER — ALPRAZOLAM 0.5 MG PO TABS: 0.5000 mg | ORAL_TABLET | Freq: Every day | ORAL | 0 refills | Status: DC | PRN

## 2018-11-19 MED ORDER — DEXILANT 60 MG PO CPDR: 60.0000 mg | DELAYED_RELEASE_CAPSULE | Freq: Every day | ORAL | 1 refills | Status: DC

## 2018-11-19 MED ORDER — BUPROPION HCL 75 MG PO TABS: 75.0000 mg | ORAL_TABLET | Freq: Two times a day (BID) | ORAL | 0 refills | Status: DC

## 2018-11-19 MED ORDER — TETANUS-DIPHTH-ACELL PERTUSSIS 5-2.5-18.5 LF-MCG/0.5 IM SUSP: 0.5000 mL | Freq: Once | INTRAMUSCULAR | 0 refills | Status: AC

## 2018-11-19 MED ORDER — ZOLPIDEM TARTRATE 10 MG PO TABS: 5.0000 mg | ORAL_TABLET | Freq: Every day | ORAL | 3 refills | Status: DC

## 2018-11-19 NOTE — Patient Instructions (Signed)
Taylor Hopkins , Thank you for taking time to come for your Medicare Wellness Visit. I appreciate your ongoing commitment to your health goals. Please review the following plan we discussed and let me know if I can assist you in the future.   Screening recommendations/referrals: Colorectal Screening: completed 07/16/16  Mammogram: 6/219/20 Bone Density: 08/13/18  Vision and Dental Exams: Recommended annual ophthalmology exams for early detection of glaucoma and other disorders of the eye Recommended annual dental exams for proper oral hygiene  Vaccinations: Influenza vaccine: today  Pneumococcal vaccine: recommended  Tdap vaccine: Please call your insurance company to determine your out of pocket expense. You may also receive this vaccine at your local pharmacy or Health Dept. Shingles vaccine: Please call your insurance company to determine your out of pocket expense for the Shingrix vaccine. You may receive this vaccine at your local pharmacy.  Advanced directives: Please bring a copy of your POA (Power of Attorney) and/or Living Will to your next appointment.  Goals: Recommend to drink at least 6-8 8oz glasses of water per day.  Next appointment: Please schedule your Annual Wellness Visit with your Nurse Health Advisor in one year.  Preventive Care 69 Years and Older, Female Preventive care refers to lifestyle choices and visits with your health care provider that can promote health and wellness. What does preventive care include?  A yearly physical exam. This is also called an annual well check.  Dental exams once or twice a year.  Routine eye exams. Ask your health care provider how often you should have your eyes checked.  Personal lifestyle choices, including:  Daily care of your teeth and gums.  Regular physical activity.  Eating a healthy diet.  Avoiding tobacco and drug use.  Limiting alcohol use.  Practicing safe sex.  Taking low-dose aspirin every day if  recommended by your health care provider.  Taking vitamin and mineral supplements as recommended by your health care provider. What happens during an annual well check? The services and screenings done by your health care provider during your annual well check will depend on your age, overall health, lifestyle risk factors, and family history of disease. Counseling  Your health care provider may ask you questions about your:  Alcohol use.  Tobacco use.  Drug use.  Emotional well-being.  Home and relationship well-being.  Sexual activity.  Eating habits.  History of falls.  Memory and ability to understand (cognition).  Work and work Statistician.  Reproductive health. Screening  You may have the following tests or measurements:  Height, weight, and BMI.  Blood pressure.  Lipid and cholesterol levels. These may be checked every 5 years, or more frequently if you are over 63 years old.  Skin check.  Lung cancer screening. You may have this screening every year starting at age 18 if you have a 30-pack-year history of smoking and currently smoke or have quit within the past 15 years.  Fecal occult blood test (FOBT) of the stool. You may have this test every year starting at age 65.  Flexible sigmoidoscopy or colonoscopy. You may have a sigmoidoscopy every 5 years or a colonoscopy every 10 years starting at age 51.  Hepatitis C blood test.  Hepatitis B blood test.  Sexually transmitted disease (STD) testing.  Diabetes screening. This is done by checking your blood sugar (glucose) after you have not eaten for a while (fasting). You may have this done every 1-3 years.  Bone density scan. This is done to screen for osteoporosis.  You may have this done starting at age 43.  Mammogram. This may be done every 1-2 years. Talk to your health care provider about how often you should have regular mammograms. Talk with your health care provider about your test results,  treatment options, and if necessary, the need for more tests. Vaccines  Your health care provider may recommend certain vaccines, such as:  Influenza vaccine. This is recommended every year.  Tetanus, diphtheria, and acellular pertussis (Tdap, Td) vaccine. You may need a Td booster every 10 years.  Zoster vaccine. You may need this after age 40.  Pneumococcal 13-valent conjugate (PCV13) vaccine. One dose is recommended after age 52.  Pneumococcal polysaccharide (PPSV23) vaccine. One dose is recommended after age 61. Talk to your health care provider about which screenings and vaccines you need and how often you need them. This information is not intended to replace advice given to you by your health care provider. Make sure you discuss any questions you have with your health care provider. Document Released: 03/09/2015 Document Revised: 10/31/2015 Document Reviewed: 12/12/2014 Elsevier Interactive Patient Education  2017 [place] Prevention in the Home Falls can cause injuries. They can happen to people of all ages. There are many things you can do to make your home safe and to help prevent falls. What can I do on the outside of my home?  Regularly fix the edges of walkways and driveways and fix any cracks.  Remove anything that might make you trip as you walk through a door, such as a raised step or threshold.  Trim any bushes or trees on the path to your home.  Use bright outdoor lighting.  Clear any walking paths of anything that might make someone trip, such as rocks or tools.  Regularly check to see if handrails are loose or broken. Make sure that both sides of any steps have handrails.  Any raised decks and porches should have guardrails on the edges.  Have any leaves, snow, or ice cleared regularly.  Use sand or salt on walking paths during winter.  Clean up any spills in your garage right away. This includes oil or grease spills. What can I do in the  bathroom?  Use night lights.  Install grab bars by the toilet and in the tub and shower. Do not use towel bars as grab bars.  Use non-skid mats or decals in the tub or shower.  If you need to sit down in the shower, use a plastic, non-slip stool.  Keep the floor dry. Clean up any water that spills on the floor as soon as it happens.  Remove soap buildup in the tub or shower regularly.  Attach bath mats securely with double-sided non-slip rug tape.  Do not have throw rugs and other things on the floor that can make you trip. What can I do in the bedroom?  Use night lights.  Make sure that you have a light by your bed that is easy to reach.  Do not use any sheets or blankets that are too big for your bed. They should not hang down onto the floor.  Have a firm chair that has side arms. You can use this for support while you get dressed.  Do not have throw rugs and other things on the floor that can make you trip. What can I do in the kitchen?  Clean up any spills right away.  Avoid walking on wet floors.  Keep items that you use a  lot in easy-to-reach places.  If you need to reach something above you, use a strong step stool that has a grab bar.  Keep electrical cords out of the way.  Do not use floor polish or wax that makes floors slippery. If you must use wax, use non-skid floor wax.  Do not have throw rugs and other things on the floor that can make you trip. What can I do with my stairs?  Do not leave any items on the stairs.  Make sure that there are handrails on both sides of the stairs and use them. Fix handrails that are broken or loose. Make sure that handrails are as long as the stairways.  Check any carpeting to make sure that it is firmly attached to the stairs. Fix any carpet that is loose or worn.  Avoid having throw rugs at the top or bottom of the stairs. If you do have throw rugs, attach them to the floor with carpet tape.  Make sure that you have a  light switch at the top of the stairs and the bottom of the stairs. If you do not have them, ask someone to add them for you. What else can I do to help prevent falls?  Wear shoes that:  Do not have high heels.  Have rubber bottoms.  Are comfortable and fit you well.  Are closed at the toe. Do not wear sandals.  If you use a stepladder:  Make sure that it is fully opened. Do not climb a closed stepladder.  Make sure that both sides of the stepladder are locked into place.  Ask someone to hold it for you, if possible.  Clearly mark and make sure that you can see:  Any grab bars or handrails.  First and last steps.  Where the edge of each step is.  Use tools that help you move around (mobility aids) if they are needed. These include:  Canes.  Walkers.  Scooters.  Crutches.  Turn on the lights when you go into a dark area. Replace any light bulbs as soon as they burn out.  Set up your furniture so you have a clear path. Avoid moving your furniture around.  If any of your floors are uneven, fix them.  If there are any pets around you, be aware of where they are.  Review your medicines with your doctor. Some medicines can make you feel dizzy. This can increase your chance of falling. Ask your doctor what other things that you can do to help prevent falls. This information is not intended to replace advice given to you by your health care provider. Make sure you discuss any questions you have with your health care provider. Document Released: 12/07/2008 Document Revised: 07/19/2015 Document Reviewed: 03/17/2014 Elsevier Interactive Patient Education  2017 [employer].

## 2018-11-19 NOTE — Patient Instructions (Addendum)
We have several Provider options: Female doctors Dr. Rogers Blocker Dr. Jonni Sanger  Female PA Inda Coke, [place] Female Doctor: Dr. Jerline Pain Any are good options just let our office know and we will change your upcoming appointment with one of them.

## 2018-11-19 NOTE — Progress Notes (Signed)
Subjective:   Taylor Hopkins is a 68 y.o. female who presents for Medicare Annual (Subsequent) preventive examination.  Review of Systems:   Cardiac Risk Factors include: advanced age (>70men, >53 women);dyslipidemia     Objective:     Vitals: BP 140/88   Ht 5' 6.75" (1.695 m)   Wt 216 lb 14.9 oz (98.4 kg)   BMI 34.23 kg/m   Body mass index is 34.23 kg/m.  Advanced Directives 11/19/2018 10/18/2018 07/16/2016  Does Patient Have a Medical Advance Directive? Yes Yes Yes  Type of Advance Directive Healthcare Power of [place]  Does patient want to make changes to medical advance directive? No - Patient declined No - Patient declined -  Copy of [place] in Chart? No - copy requested - No - copy requested    Tobacco Social History   Tobacco Use  Smoking Status Never Smoker  Smokeless Tobacco Never Used     Counseling given: Not Answered   Clinical Intake:  Pre-visit preparation completed: Yes  Pain : No/denies pain  Diabetes: No  How often do you need to have someone help you when you read instructions, pamphlets, or other written materials from your doctor or pharmacy?: 1 - Never  Interpreter Needed?: No  Information entered by :: Denman George LPN  Past Medical History:  Diagnosis Date  . Adenomatous colon polyp 06/2003  . Allergic rhinitis   . Anxiety   . Arthritis   . Cataract 2010   bilateral eyes  . Depression   . GERD (gastroesophageal reflux disease)   . Sleep apnea    Not currently using CPAP - last sleep study 2016   Past Surgical History:  Procedure Laterality Date  . ANKLE SURGERY Right   . BREAST ENHANCEMENT SURGERY    . COLONOSCOPY    . LUMBAR DISC SURGERY    . TONSILLECTOMY    . TOTAL ABDOMINAL HYSTERECTOMY    . UPPER GASTROINTESTINAL ENDOSCOPY     Family History  Problem Relation Age of Onset  . Allergies Brother   . Heart attack Father   . Colon  polyps Father   . Diverticulitis Father   . Heart failure Father   . Esophageal cancer Maternal Grandmother   . Heart failure Mother   . Mitral valve prolapse Mother   . Diabetes Mother   . Kidney failure Mother   . Stomach cancer Paternal Uncle   . Colon cancer Neg Hx   . Rectal cancer Neg Hx    Social History   Socioeconomic History  . Marital status: Married    Spouse name: Coralyn Mark  . Number of children: 2  . Years of education: Not on file  . Highest education level: Not on file  Occupational History    Employer: RETIRED    Comment: [employer]  . Financial resource strain: Not on file  . Food insecurity    Worry: Not on file    Inability: Not on file  . Transportation needs    Medical: Not on file    Non-medical: Not on file  Tobacco Use  . Smoking status: Never Smoker  . Smokeless tobacco: Never Used  Substance and Sexual Activity  . Alcohol use: Yes    Alcohol/week: 0.0 standard drinks    Comment: 1 per month  . Drug use: No  . Sexual activity: Yes    Birth control/protection: Post-menopausal  Lifestyle  . Physical  activity    Days per week: 3 days    Minutes per session: 30 min  . Stress: To some extent  Relationships  . Social Herbalist on phone: Not on file    Gets together: Not on file    Attends religious service: Not on file    Active member of club or organization: Not on file    Attends meetings of clubs or organizations: Not on file    Relationship status: Not on file  Other Topics Concern  . Not on file  Social History Narrative  . Not on file    Outpatient Encounter Medications as of 11/19/2018  Medication Sig  . BIOTIN PO Take 3,000 mcg by mouth.  . Calcium Citrate-Vitamin D (CALCIUM + D PO) Take by mouth.  . citalopram (CELEXA) 40 MG tablet Take 20 mg by mouth daily. Alternates between 20mg  and 40 mg daily.   . Cyanocobalamin (VITAMIN B12 PO) Take by mouth.  . Probiotic Product (DIGESTIVE ADVANTAGE GUMMIES PO)  Take by mouth.  . triamcinolone (NASACORT) 55 MCG/ACT AERO nasal inhaler Place 2 sprays into the nose daily.  . [DISCONTINUED] buPROPion (WELLBUTRIN) 75 MG tablet Take 1 tablet (75 mg total) by mouth 2 (two) times daily.  . [DISCONTINUED] DEXILANT 60 MG capsule TAKE 1 CAPSULE BY MOUTH EVERY DAY  . [DISCONTINUED] sucralfate (CARAFATE) 1 g tablet TAKE 1 TABLET BY MOUTH 4 TIMES DAILY - WITH MEALS AND AT BEDTIME. DISSOLVE IN 2 TSP WATER FOR SLURRY  . [DISCONTINUED] zolpidem (AMBIEN) 10 MG tablet Take 0.5 tablets (5 mg total) by mouth at bedtime.   No facility-administered encounter medications on file as of 11/19/2018.     Activities of Daily Living In your present state of health, do you have any difficulty performing the following activities: 11/19/2018 11/19/2018  Hearing? N N  Vision? N N  Difficulty concentrating or making decisions? N N  Walking or climbing stairs? N N  Dressing or bathing? N N  Doing errands, shopping? N N  Preparing Food and eating ? N -  Using the Toilet? N -  In the past six months, have you accidently leaked urine? N -  Do you have problems with loss of bowel control? N -  Managing your Medications? N -  Managing your Finances? N -  Housekeeping or managing your Housekeeping? N -  Some recent data might be hidden    Patient Care Team: Briscoe Deutscher, DO as PCP - General (Family Medicine) Lady Gary, [facility] as Consulting Physician (Gynecology) Berle Mull, MD as Consulting Physician (Sports Medicine) Ladene Artist, MD as Consulting Physician (Gastroenterology) Magnus Sinning, MD as Consulting Physician (Physical Medicine and Rehabilitation)    Assessment:   This is a routine wellness examination for [place].  Exercise Activities and Dietary recommendations    Goals   None     Fall Risk Fall Risk  11/19/2018 07/22/2018  Falls in the past year? 0 0  Number falls in past yr: 0 0  Injury with Fall? 0 0  Follow up Education  provided;Falls prevention discussed;Falls evaluation completed -   Is the patient's home free of loose throw rugs in walkways, pet beds, electrical cords, etc?   yes      Grab bars in the bathroom? yes      Handrails on the stairs?   yes      Adequate lighting?   yes  Timed Get Up and Go performed: completed and within normal  timeframe; no gait abnormalities noted    Depression Screen PHQ 2/9 Scores 11/19/2018 07/22/2018 07/02/2018  PHQ - 2 Score 0 6 1  PHQ- 9 Score 6 24 11      Cognitive Function-no cognitive concerns at this time  Alert? Yes         Normal Appearance? Yes  Oriented to person? Yes           Place? Yes  Time? Yes  Recall of three objects? Yes  Can perform simple calculations? Yes  Displays appropriate judgment? Yes  Can read the correct time from a watch face? Yes      Immunization History  Administered Date(s) Administered  . Fluad Quad(high Dose 65+) 11/19/2018  . Pneumococcal Polysaccharide-23 11/19/2018    Qualifies for Shingles Vaccine?Discussed and patient will check with pharmacy for coverage.  Patient education handout provided    Screening Tests Health Maintenance  Topic Date Due  . TETANUS/TDAP  10/02/1968  . PNA vac Low Risk Adult (1 of 2 - PCV13) 10/03/2014  . INFLUENZA VACCINE  09/25/2018  . COLONOSCOPY  07/17/2019  . MAMMOGRAM  08/12/2020  . DEXA SCAN  Completed  . Hepatitis C Screening  Completed    Cancer Screenings: Lung: Low Dose CT Chest recommended if Age 42-80 years, 30 pack-year currently smoking OR have quit w/in 15years. Patient does not qualify. Breast:  Up to date on Mammogram? Yes   Up to date of Bone Density/Dexa? Yes Colorectal: colonoscopy 07/16/16 with Dr. Fuller Plan    Plan:  I have personally reviewed and addressed the Medicare Annual Wellness questionnaire and have noted the following in the patient's chart:  A. Medical and social history B. Use of alcohol, tobacco or illicit drugs  C. Current medications and  supplements D. Functional ability and status E.  Nutritional status F.  Physical activity G. Advance directives H. List of other physicians I.  Hospitalizations, surgeries, and ER visits in previous 12 months [place] such as hearing and vision if needed, cognitive and depression L. Referrals, records requested, and appointments- none   In addition, I have reviewed and discussed with patient certain preventive protocols, quality metrics, and best practice recommendations. A written personalized care plan for preventive services as well as general preventive health recommendations were provided to patient.   Signed,  Denman George, LPN  Nurse Health Advisor   Nurse Notes: No additional

## 2018-11-21 NOTE — Progress Notes (Signed)
I have reviewed documentation for AWV and Advance Care planning provided by [facility], I agree with documentation, I was immediately available for any questions. Quintasia Theroux, DO   

## 2018-11-23 ENCOUNTER — Encounter: Payer: Medicare Other | Admitting: Physical Therapy

## 2018-12-11 ENCOUNTER — Other Ambulatory Visit: Payer: Self-pay | Admitting: Family Medicine

## 2018-12-11 DIAGNOSIS — F5101 Primary insomnia: Secondary | ICD-10-CM

## 2018-12-15 ENCOUNTER — Encounter: Payer: Self-pay | Admitting: Family Medicine

## 2018-12-16 MED ORDER — GLUCOSE BLOOD VI STRP: ORAL_STRIP | 3 refills | Status: DC

## 2018-12-20 ENCOUNTER — Telehealth: Payer: Self-pay | Admitting: Family Medicine

## 2018-12-20 ENCOUNTER — Other Ambulatory Visit: Payer: Self-pay

## 2018-12-20 MED ORDER — GLUCOSE BLOOD VI STRP: ORAL_STRIP | 3 refills | Status: DC

## 2018-12-20 NOTE — Telephone Encounter (Signed)
Rx has been re-sent to pharmacy.

## 2018-12-20 NOTE — Telephone Encounter (Signed)
Copied from [place] [phone] [id number]. Topic: General - Inquiry >> Dec 20, 2018 12:49 PM Richardo Priest, NT wrote: Reason for CRM: Patient called in, in regards to test strips, and stated that pharmacy is telling her that it has not been filled or sent over to them. Patient had 3 pharmacists check for her before calling and stated no one was able to find it. Please advise and resend.

## 2018-12-27 ENCOUNTER — Encounter: Payer: Medicare Other | Admitting: Physical Therapy

## 2019-01-02 ENCOUNTER — Encounter: Payer: Self-pay | Admitting: Family Medicine

## 2019-01-03 MED ORDER — VALACYCLOVIR HCL 1 G PO TABS: 2000.0000 mg | ORAL_TABLET | Freq: Two times a day (BID) | ORAL | 2 refills | Status: DC

## 2019-01-03 NOTE — Telephone Encounter (Signed)
Please see message. Pt is scheduled to see you Dec.

## 2019-01-24 ENCOUNTER — Other Ambulatory Visit: Payer: Self-pay

## 2019-01-26 ENCOUNTER — Other Ambulatory Visit: Payer: Self-pay

## 2019-01-26 ENCOUNTER — Encounter: Payer: Self-pay | Admitting: Family Medicine

## 2019-01-26 ENCOUNTER — Ambulatory Visit (INDEPENDENT_AMBULATORY_CARE_PROVIDER_SITE_OTHER): Payer: Medicare Other | Admitting: Family Medicine

## 2019-01-26 VITALS — BP 138/88 | HR 90 | Temp 98.6°F | Ht 66.75 in | Wt 217.0 lb

## 2019-01-26 DIAGNOSIS — F4381 Prolonged grief disorder: Secondary | ICD-10-CM | POA: Insufficient documentation

## 2019-01-26 DIAGNOSIS — F339 Major depressive disorder, recurrent, unspecified: Secondary | ICD-10-CM | POA: Diagnosis not present

## 2019-01-26 DIAGNOSIS — M8580 Other specified disorders of bone density and structure, unspecified site: Secondary | ICD-10-CM

## 2019-01-26 DIAGNOSIS — F4321 Adjustment disorder with depressed mood: Secondary | ICD-10-CM

## 2019-01-26 DIAGNOSIS — F419 Anxiety disorder, unspecified: Secondary | ICD-10-CM

## 2019-01-26 DIAGNOSIS — F4329 Adjustment disorder with other symptoms: Secondary | ICD-10-CM | POA: Insufficient documentation

## 2019-01-26 MED ORDER — BUPROPION HCL ER (SR) 150 MG PO TB12: 150.0000 mg | ORAL_TABLET | Freq: Two times a day (BID) | ORAL | 1 refills | Status: DC

## 2019-01-26 MED ORDER — CLONAZEPAM 0.5 MG PO TABS: 0.5000 mg | ORAL_TABLET | Freq: Two times a day (BID) | ORAL | 0 refills | Status: DC | PRN

## 2019-01-26 MED ORDER — CITALOPRAM HYDROBROMIDE 40 MG PO TABS: ORAL_TABLET | ORAL | 0 refills | Status: DC

## 2019-01-26 NOTE — Progress Notes (Signed)
Subjective  CC:  Chief Complaint  Patient presents with  . Anxiety  . Insomnia    HPI: Taylor Hopkins is a 68 y.o. female who presents to [facility] today to establish care with me as a new patient.   She has the following concerns or needs:  Anxiety/stress; pt is not doing well. GAD 7 is 21 today! Feels down, anxious and isn't sleeping. Has had several losses: mom died over a year ago, she was her caretaker prior to her death. She suffered some marital conflict and has went through counseling; her brother died, and she had to retire which is difficult for her because she was used to working (57 years) and doesn't like having to be home alone (covid). She is tearful and fretful. No SI. She has h/o depression treated for many years on celexa. No longer helping. Was started on wellbutrin in may for worsening sxs but doesn't feel it has helped much. No AEs.   Has failed zoloft, celexa, trazadone, and effexor in the past.   ostepenia by last DEXA; now on prolia.   Assessment  1. Major depression, recurrent, chronic ([place])   2. Grief reaction with prolonged bereavement   3. Anxiety   4. Osteopenia, unspecified location      Plan   Mood: doing poorly. Counseling done. Wean celexa and increase wellbutrin. Recheck in 3 weeks. Work on improving her outlook and working towards acceptance for the loss of her mother. Will try trintellix if can get it approved; or prozac. Add klonopin bid prn for anxiety and sleep. Patient understands and agrees with care plan.   Osteopenia: pt denies h/o osteoporosis. rec holding prolia and recheck dexa in 2 years.   Follow up:  Return in about 3 weeks (around 02/16/2019) for mood follow up. No orders of the defined types were placed in this encounter.  Meds ordered this encounter  Medications  . clonazePAM (KLONOPIN) 0.5 MG tablet    Sig: Take 1 tablet (0.5 mg total) by mouth 2 (two) times daily as needed for anxiety.   Dispense:  30 tablet    Refill:  0  . buPROPion (WELLBUTRIN SR) 150 MG 12 hr tablet    Sig: Take 1 tablet (150 mg total) by mouth 2 (two) times daily.    Dispense:  180 tablet    Refill:  1  . citalopram (CELEXA) 40 MG tablet    Sig: Take 0.5 tablets (20 mg total) by mouth daily for 7 days, THEN 0.5 tablets (20 mg total) every other day for 7 days, THEN 0.5 tablets (20 mg total) 3 (three) times a week for 7 days.    Dispense:  6.5 tablet    Refill:  0     Depression screen [facility] 2/9 11/19/2018 07/22/2018 07/02/2018  Decreased Interest 0 3 1  Down, Depressed, Hopeless 0 3 0  PHQ - 2 Score 0 6 1  Altered sleeping 3 3 2   Tired, decreased energy 1 3 2   Change in appetite 1 3 2   Feeling bad or failure about yourself  1 3 2   Trouble concentrating 0 3 1  Moving slowly or fidgety/restless 0 3 1  Suicidal thoughts 0 0 0  PHQ-9 Score 6 24 11   Difficult doing work/chores Not difficult at all Somewhat difficult Somewhat difficult    We updated and reviewed the patient's past history in detail and it is documented below.  Patient Active Problem List   Diagnosis  Date Noted  . Grief reaction with prolonged bereavement 01/26/2019  . S/P dilatation of esophageal stricture 07/23/2018  . Anxiety 07/23/2018  . Personal history of colonic polyps, followed by Dr. Fuller Plan 07/04/2018  . IBS (irritable bowel syndrome) 07/04/2018  . Pure hypercholesterolemia 07/04/2018  . Chronic neck pain due to DJD, DDD, unable to tolerate NSAIDs 07/04/2018  . Vitamin D deficiency, on daily supplement 07/04/2018  . Insomnia, Rx prn 1/2 Ambien 07/04/2018  . Osteoporosis, previously on bisphosphonate, with gastritis/dyspepsia 07/04/2018  . Allergic rhinitis 07/04/2018  . Depression, major, single episode, complete remission ([place]) 07/04/2018  . OSA (obstructive sleep apnea), not on CPAP 07/04/2018  . GERD (gastroesophageal reflux disease) 10/02/2008   Health Maintenance  Topic Date Due  . Samul Dada  10/02/1969  .  COLONOSCOPY  07/17/2019  . MAMMOGRAM  08/13/2019  . PNA vac Low Risk Adult (2 of 2 - PCV13) 11/19/2019  . INFLUENZA VACCINE  Completed  . DEXA SCAN  Completed  . Hepatitis C Screening  Completed   Immunization History  Administered Date(s) Administered  . Fluad Quad(high Dose 65+) 11/19/2018  . Pneumococcal Polysaccharide-23 11/19/2018   Current Meds  Medication Sig  . BIOTIN PO Take 3,000 mcg by mouth.  . Calcium Citrate-Vitamin D (CALCIUM + D PO) Take by mouth.  . citalopram (CELEXA) 40 MG tablet Take 0.5 tablets (20 mg total) by mouth daily for 7 days, THEN 0.5 tablets (20 mg total) every other day for 7 days, THEN 0.5 tablets (20 mg total) 3 (three) times a week for 7 days.  . Cyanocobalamin (VITAMIN B12 PO) Take by mouth.  . dexlansoprazole (DEXILANT) 60 MG capsule Take 1 capsule (60 mg total) by mouth daily.  Marland Kitchen glucose blood test strip Use as instructed  . Probiotic Product (DIGESTIVE ADVANTAGE GUMMIES PO) Take by mouth.  . sucralfate (CARAFATE) 1 g tablet TAKE 1 TABLET BY MOUTH 4 TIMES DAILY - WITH MEALS AND AT BEDTIME. DISSOLVE IN 2 TSP WATER FOR SLURRY  . triamcinolone (NASACORT) 55 MCG/ACT AERO nasal inhaler Place 2 sprays into the nose daily.  . valACYclovir (VALTREX) 1000 MG tablet Take 2 tablets (2,000 mg total) by mouth 2 (two) times daily. Once, as needed for cold sores  . [DISCONTINUED] buPROPion (WELLBUTRIN) 75 MG tablet Take 1 tablet (75 mg total) by mouth 2 (two) times daily.  . [DISCONTINUED] citalopram (CELEXA) 40 MG tablet Take 20 mg by mouth daily. Alternates between 20mg  and 40 mg daily.   . [DISCONTINUED] traZODone (DESYREL) 50 MG tablet TAKE 0.5-1 TABLETS (25-50 MG TOTAL) BY MOUTH AT BEDTIME AS NEEDED FOR SLEEP.  . [DISCONTINUED] zolpidem (AMBIEN) 10 MG tablet Take 0.5 tablets (5 mg total) by mouth at bedtime.    Allergies: Patient is allergic to sulfonamide derivatives; adhesive  [tape]; and sulfa antibiotics. Past Medical History Patient  has a past  medical history of Adenomatous colon polyp (06/2003), Allergic rhinitis, Anxiety, Arthritis, Cataract (2010), Depression, GERD (gastroesophageal reflux disease), and Sleep apnea. Past Surgical History Patient  has a past surgical history that includes Tonsillectomy; Lumbar disc surgery; Total abdominal hysterectomy; Ankle surgery (Right); Breast enhancement surgery; Colonoscopy; and Upper gastrointestinal endoscopy. Family History: Patient family history includes Allergies in her brother; Colon polyps in her father; Diabetes in her mother; Diverticulitis in her father; Esophageal cancer in her maternal grandmother; Heart attack in her father; Heart failure in her father and mother; Kidney failure in her mother; Mitral valve prolapse in her mother; Stomach cancer in her paternal uncle. Social History:  Patient  reports that she has never smoked. She has never used smokeless tobacco. She reports current alcohol use. She reports that she does not use drugs.  Review of Systems: Constitutional: negative for fever or malaise Ophthalmic: negative for photophobia, double vision or loss of vision Cardiovascular: negative for chest pain, dyspnea on exertion, or new LE swelling Respiratory: negative for SOB or persistent cough Gastrointestinal: negative for abdominal pain, change in bowel habits or melena Integumentary: negative for new or persistent rashes Neurological: negative for TIA or stroke symptoms Psychiatric: negative for SI or delusions Allergic/Immunologic: negative for hives  Patient Care Team    Relationship Specialty Notifications Start End  Leamon Arnt, MD PCP - General Family Medicine  01/26/19   Lady Gary, [facility] Consulting Physician Gynecology  07/02/18    Comment: Merrily Pew, Jeneen Rinks, MD Consulting Physician Sports Medicine  07/02/18   Ladene Artist, MD Consulting Physician Gastroenterology  07/02/18   Magnus Sinning, MD Consulting Physician Physical Medicine  and Rehabilitation  11/19/18     Objective  Vitals: BP 138/88 (BP Location: Left Arm, Patient Position: Sitting, Cuff Size: Normal)   Pulse 90   Temp 98.6 F (37 C) (Temporal)   Ht 5' 6.75" (1.695 m)   Wt 217 lb (98.4 kg)   SpO2 97%   BMI 34.24 kg/m  General:  Well developed, well nourished, no acute distress  Psych:  Alert and oriented,anxious and tearful    Commons side effects, risks, benefits, and alternatives for medications and treatment plan prescribed today were discussed, and the patient expressed understanding of the given instructions. Patient is instructed to call or message via MyChart if he/she has any questions or concerns regarding our treatment plan. No barriers to understanding were identified. We discussed Red Flag symptoms and signs in detail. Patient expressed understanding regarding what to do in case of urgent or emergency type symptoms.   Medication list was reconciled, printed and provided to the patient in AVS. Patient instructions and summary information was reviewed with the patient as documented in the AVS. This note was prepared with assistance of Dragon voice recognition software. Occasional wrong-word or sound-a-like substitutions may have occurred due to the inherent limitations of voice recognition software  This visit occurred during the SARS-CoV-2 public health emergency.  Safety protocols were in place, including screening questions prior to the visit, additional usage of staff PPE, and extensive cleaning of exam room while observing appropriate contact time as indicated for disinfecting solutions.

## 2019-01-26 NOTE — Patient Instructions (Signed)
Please return in 3 weeks to recheck mood and adjust medications.  Work on positive thinking and self care; work on mindfulness, living in the present moment (not the past) and trying to get to acceptance. It will make you feel better.  If you have any questions or concerns, please don't hesitate to send me a message via MyChart or call the office at [phone]. Thank you for visiting with [place] today! It's our pleasure caring for you.

## 2019-01-31 ENCOUNTER — Encounter: Payer: Self-pay | Admitting: Physical Therapy

## 2019-01-31 ENCOUNTER — Other Ambulatory Visit: Payer: Self-pay

## 2019-01-31 ENCOUNTER — Ambulatory Visit (INDEPENDENT_AMBULATORY_CARE_PROVIDER_SITE_OTHER): Payer: Medicare Other | Admitting: Physical Therapy

## 2019-01-31 DIAGNOSIS — M542 Cervicalgia: Secondary | ICD-10-CM

## 2019-01-31 DIAGNOSIS — G8929 Other chronic pain: Secondary | ICD-10-CM

## 2019-01-31 NOTE — Patient Instructions (Signed)
Access Code: [email or web address]  URL: [email or web address]  Date: 01/31/2019  Prepared by: Lyndee Hensen   Exercises Standing Backward Shoulder Rolls - 10 reps - 1 sets - 2x daily Neck Rotation - 5 reps - 1 sets - 2x daily Seated Scapular Retraction - 10 reps - 1 sets - 2x daily Scapular Retraction with Resistance - 10 reps - 2 sets - 1x daily Seated Upper Trapezius Stretch - 3 reps - 30 hold - 2x daily Seated Levator Scapulae Stretch - 3 reps - 30 hold - 2x daily

## 2019-01-31 NOTE — Therapy (Signed)
[place], [place], [address] Phone: [phone]   Fax:  [phone]  Physical Therapy Evaluation  Patient Details  Name: Taylor Hopkins MRN: [id number] Date of Birth: December 28, 1950 Referring Provider (PT): Ernestina Patches   Encounter Date: 01/31/2019  PT End of Session - 01/31/19 1120    Visit Number  1    Number of Visits  12    Date for PT Re-Evaluation  03/14/19    Authorization Type  Medicare    PT Start Time  1107    PT Stop Time  1155    PT Time Calculation (min)  48 min    Activity Tolerance  Patient tolerated treatment well    Behavior During Therapy  [facility] for tasks assessed/performed       Past Medical History:  Diagnosis Date  . Adenomatous colon polyp 06/2003  . Allergic rhinitis   . Anxiety   . Arthritis   . Cataract 2010   bilateral eyes  . Depression   . GERD (gastroesophageal reflux disease)   . Sleep apnea    Not currently using CPAP - last sleep study 2016    Past Surgical History:  Procedure Laterality Date  . ANKLE SURGERY Right   . BREAST ENHANCEMENT SURGERY    . COLONOSCOPY    . LUMBAR DISC SURGERY    . TONSILLECTOMY    . TOTAL ABDOMINAL HYSTERECTOMY    . UPPER GASTROINTESTINAL ENDOSCOPY      There were no vitals filed for this visit.   Subjective Assessment - 01/31/19 1116    Subjective  Pt states no new injury, but does report increased stress. States ongoing neck pain on L, was seen in PT previously, which helped.    Limitations  Reading;Lifting;Writing;House hold activities    Patient Stated Goals  decreased pain    Currently in Pain?  Yes    Pain Score  5     Pain Location  Back    Pain Orientation  Upper;Left    Pain Descriptors / Indicators  Spasm    Pain Type  Acute pain    Pain Onset  More than a month ago    Pain Frequency  Intermittent         [facility] PT Assessment - 01/31/19 1121      Assessment   Medical Diagnosis  Cervical Spondylosis    Referring  Provider (PT)  [place]    Hand Dominance  Right    Prior Therapy  Yes      Prior Function   Level of Independence  Independent      Cognition   Overall Cognitive Status  Within Functional Limits for tasks assessed      AROM   Overall AROM Comments  Shoulder AROM: WNL    AROM Assessment Site  Cervical    Cervical Flexion  wfl    Cervical Extension  mild limitation    Cervical - Right Rotation  58    Cervical - Left Rotation  55      Strength   Overall Strength Comments  Shoulder L : 4-/5;  Scapular: 4-/5 ;   R: 4+/5       Palpation   Palpation comment  Pain at L cervical paraspinals, L UT, levator,  TIghtness in L UT vs R.       Special Tests   Other special tests  Neg ULTT,  Objective measurements completed on examination: See above findings.      [facility] Adult PT Treatment/Exercise - 01/31/19 0001      Modalities   Modalities  Moist Heat      Moist Heat Therapy   Number Minutes Moist Heat  10 Minutes    Moist Heat Location  Cervical      Manual Therapy   Manual therapy comments  skilled palpation and monitoring of soft tissue during dry needling.     Soft tissue mobilization  STM to L UT and rhomboid.        Trigger Point Dry Needling - 01/31/19 0001    Consent Given?  Yes    Education Handout Provided  Previously provided    Upper Trapezius Response  Twitch reponse elicited;Palpable increased muscle length    Levator Scapulae Response  Palpable increased muscle length    Rhomboids Response  Palpable increased muscle length           PT Education - 01/31/19 1119    Education Details  PT POC, Exam findings.    Person(s) Educated  Patient    Methods  Explanation    Comprehension  Verbalized understanding;Returned demonstration       PT Short Term Goals - 01/31/19 1120      PT SHORT TERM GOAL #1   Title  Pt to be indepenent with initial HEP    Time  2    Period  Weeks    Status  New    Target Date  02/14/19        PT  Long Term Goals - 01/31/19 1120      PT LONG TERM GOAL #1   Title  Pt to be independent with final HEP    Time  6    Period  Weeks    Status  New    Target Date  03/14/19      PT LONG TERM GOAL #2   Title  Pt to demo improved cervical rotation, to be [facility] for pt age, with pain 0-1/10 at end range, to improve ability for head turns and driving.    Time  6    Period  Weeks    Status  New    Target Date  03/14/19      PT LONG TERM GOAL #3   Title  Pt to demo soft tissue restrictions to be WNL in cervical musculature, to decrease pain and improve ROM.    Time  6    Period  Weeks    Status  Partially Met    Target Date  03/14/19      PT LONG TERM GOAL #4   Title  Pt to demo decreased pain in neck, to 0-2/10 with activity.    Time  6    Period  Weeks    Status  New    Target Date  03/14/19             Plan - 01/31/19 1217    Clinical Impression Statement  Pt returns today with continued, increased pain in L sided cervical region. She has pain in mid cervical paraspinals, L UT, levator, and into rhomboid. Dry needling started today, will assess effects next visit. Pt with mild limitation of rotation and decreased ability for functional activities due to pain. Pt also with much difficulty sleeping due to pain, discussed posture and pillows today. Pt to benefit from skilled PT to improve pain.    Examination-Activity Limitations  Reach Overhead;Carry;Lift;Sleep  Examination-Participation Restrictions  Meal Prep;Cleaning;Community Activity;Driving;Laundry;Yard Work    Stability/Clinical Decision Making  Stable/Uncomplicated    Clinical Decision Making  Low    Rehab Potential  Good    PT Frequency  2x / week    PT Duration  6 weeks    PT Treatment/Interventions  ADLs/Self Care Home Management;Cryotherapy;Electrical Stimulation;DME Instruction;Ultrasound;Traction;Moist Heat;Iontophoresis 39m/ml Dexamethasone;Functional mobility training;Therapeutic activities;Therapeutic  exercise;Neuromuscular re-education;Manual techniques;Patient/family education;Passive range of motion;Dry needling;Taping;Spinal Manipulations;Joint Manipulations    PT Home Exercise Plan  [id number]   Consulted and Agree with Plan of Care  Patient       Patient will benefit from skilled therapeutic intervention in order to improve the following deficits and impairments:  Decreased range of motion, Increased muscle spasms, Decreased activity tolerance, Pain, Improper body mechanics, Impaired flexibility, Decreased mobility, Decreased strength  Visit Diagnosis: Cervicalgia     Problem List Patient Active Problem List   Diagnosis Date Noted  . Grief reaction with prolonged bereavement 01/26/2019  . S/P dilatation of esophageal stricture 07/23/2018  . Anxiety 07/23/2018  . Personal history of colonic polyps, followed by Dr. SFuller Plan05/11/2018  . IBS (irritable bowel syndrome) 07/04/2018  . Pure hypercholesterolemia 07/04/2018  . Chronic neck pain due to DJD, DDD, unable to tolerate NSAIDs 07/04/2018  . Vitamin D deficiency, on daily supplement 07/04/2018  . Insomnia, Rx prn 1/2 Ambien 07/04/2018  . Osteoporosis, previously on bisphosphonate, with gastritis/dyspepsia 07/04/2018  . Allergic rhinitis 07/04/2018  . Depression, major, single episode, complete remission ([place] 07/04/2018  . OSA (obstructive sleep apnea), not on CPAP 07/04/2018  . GERD (gastroesophageal reflux disease) 10/02/2008   LLyndee Hensen PT, DPT 12:26 PM  01/31/19    [place] [address] [address]Phone: [phone]  Fax:  [phone] Name: Taylor HOLSONBACKMRN: [id number] of Birth: [birth year]

## 2019-02-02 ENCOUNTER — Ambulatory Visit (INDEPENDENT_AMBULATORY_CARE_PROVIDER_SITE_OTHER): Payer: Medicare Other | Admitting: Physical Therapy

## 2019-02-02 ENCOUNTER — Other Ambulatory Visit: Payer: Self-pay

## 2019-02-02 ENCOUNTER — Encounter: Payer: Self-pay | Admitting: Physical Therapy

## 2019-02-02 DIAGNOSIS — M542 Cervicalgia: Secondary | ICD-10-CM

## 2019-02-02 DIAGNOSIS — G8929 Other chronic pain: Secondary | ICD-10-CM

## 2019-02-02 NOTE — Therapy (Signed)
[place], [place], [address] Phone: [phone]   Fax:  [phone]  Physical Therapy Treatment  Patient Details  Name: Taylor Hopkins MRN: [id number] Date of Birth: 01/27/1951 Referring Provider (PT): Ernestina Patches   Encounter Date: 02/02/2019  PT End of Session - 02/02/19 1024    Visit Number  2    Number of Visits  12    Date for PT Re-Evaluation  03/14/19    Authorization Type  Medicare    Authorization - Visit Number  14    PT Start Time  1020    PT Stop Time  1056    PT Time Calculation (min)  36 min    Activity Tolerance  Patient tolerated treatment well    Behavior During Therapy  [facility] for tasks assessed/performed       Past Medical History:  Diagnosis Date  . Adenomatous colon polyp 06/2003  . Allergic rhinitis   . Anxiety   . Arthritis   . Cataract 2010   bilateral eyes  . Depression   . GERD (gastroesophageal reflux disease)   . Sleep apnea    Not currently using CPAP - last sleep study 2016    Past Surgical History:  Procedure Laterality Date  . ANKLE SURGERY Right   . BREAST ENHANCEMENT SURGERY    . COLONOSCOPY    . LUMBAR DISC SURGERY    . TONSILLECTOMY    . TOTAL ABDOMINAL HYSTERECTOMY    . UPPER GASTROINTESTINAL ENDOSCOPY      There were no vitals filed for this visit.  Subjective Assessment - 02/02/19 1023    Subjective  Pt altered pillow, is sleeping better. She also states decreased headache,since last visit.    Currently in Pain?  Yes                       [facility] Adult PT Treatment/Exercise - 02/02/19 0001      Neck Exercises: Machines for Strengthening   UBE (Upper Arm Bike)  L1 x 4 min total  (fwd/bwd)       Neck Exercises: Theraband   Rows  20 reps;Green    Shoulder External Rotation  20 reps    Shoulder External Rotation Limitations  bil ER, YTB      Neck Exercises: Seated   Cervical Rotation  5 reps      Neck Exercises: Supine   Other Supine  Exercise  Shoulder flexion AROM x10, Horizontal Abd x10; practice for shoulder  posture       Manual Therapy   Manual Therapy  Passive ROM    Soft tissue mobilization  STM to L UT and paraspinals.    Passive ROM  Cervical, manual stretching for Bil UTs    Manual Traction  10 sec x10 cervical                PT Short Term Goals - 01/31/19 1120      PT SHORT TERM GOAL #1   Title  Pt to be indepenent with initial HEP    Time  2    Period  Weeks    Status  New    Target Date  02/14/19        PT Long Term Goals - 01/31/19 1120      PT LONG TERM GOAL #1   Title  Pt to be independent with final HEP    Time  6    Period  Weeks  Status  New    Target Date  03/14/19      PT LONG TERM GOAL #2   Title  Pt to demo improved cervical rotation, to be [facility] for pt age, with pain 0-1/10 at end range, to improve ability for head turns and driving.    Time  6    Period  Weeks    Status  New    Target Date  03/14/19      PT LONG TERM GOAL #3   Title  Pt to demo soft tissue restrictions to be WNL in cervical musculature, to decrease pain and improve ROM.    Time  6    Period  Weeks    Status  Partially Met    Target Date  03/14/19      PT LONG TERM GOAL #4   Title  Pt to demo decreased pain in neck, to 0-2/10 with activity.    Time  6    Period  Weeks    Status  New    Target Date  03/14/19            Plan - 02/02/19 1100    Clinical Impression Statement  Pt with decreased pain today from last session. She has mild soreness and tightness in L UT, and in L cervical paraspinals. Ther ex done for posture, pec stretching and postural strengthening. Plan to progress as tolerated.    Examination-Activity Limitations  Reach Overhead;Carry;Lift;Sleep    Examination-Participation Restrictions  Meal Prep;Cleaning;Community Activity;Driving;Laundry;Yard Work    Stability/Clinical Decision Making  Stable/Uncomplicated    Rehab Potential  Good    PT Frequency  2x / week    PT  Duration  6 weeks    PT Treatment/Interventions  ADLs/Self Care Home Management;Cryotherapy;Electrical Stimulation;DME Instruction;Ultrasound;Traction;Moist Heat;Iontophoresis 24m/ml Dexamethasone;Functional mobility training;Therapeutic activities;Therapeutic exercise;Neuromuscular re-education;Manual techniques;Patient/family education;Passive range of motion;Dry needling;Taping;Spinal Manipulations;Joint Manipulations    PT Home Exercise Plan  [id number]   Consulted and Agree with Plan of Care  Patient       Patient will benefit from skilled therapeutic intervention in order to improve the following deficits and impairments:  Decreased range of motion, Increased muscle spasms, Decreased activity tolerance, Pain, Improper body mechanics, Impaired flexibility, Decreased mobility, Decreased strength  Visit Diagnosis: Chronic neck pain due to DJD, DDD, unable to tolerate NSAIDs  Cervicalgia     Problem List Patient Active Problem List   Diagnosis Date Noted  . Grief reaction with prolonged bereavement 01/26/2019  . S/P dilatation of esophageal stricture 07/23/2018  . Anxiety 07/23/2018  . Personal history of colonic polyps, followed by Dr. SFuller Plan05/11/2018  . IBS (irritable bowel syndrome) 07/04/2018  . Pure hypercholesterolemia 07/04/2018  . Chronic neck pain due to DJD, DDD, unable to tolerate NSAIDs 07/04/2018  . Vitamin D deficiency, on daily supplement 07/04/2018  . Insomnia, Rx prn 1/2 Ambien 07/04/2018  . Osteoporosis, previously on bisphosphonate, with gastritis/dyspepsia 07/04/2018  . Allergic rhinitis 07/04/2018  . Depression, major, single episode, complete remission ([place] 07/04/2018  . OSA (obstructive sleep apnea), not on CPAP 07/04/2018  . GERD (gastroesophageal reflux disease) 10/02/2008    LLyndee Hensen PT, DPT 11:03 AM  02/02/19    [facility] [address]Phone: [phone]  Fax:   [phone] Name: BSAUMYA HUKILLMRN: [id number] of Birth: 806-10-1950

## 2019-02-07 ENCOUNTER — Other Ambulatory Visit: Payer: Self-pay

## 2019-02-08 ENCOUNTER — Encounter: Payer: Self-pay | Admitting: Physical Therapy

## 2019-02-08 ENCOUNTER — Ambulatory Visit (INDEPENDENT_AMBULATORY_CARE_PROVIDER_SITE_OTHER): Payer: Medicare Other | Admitting: Physical Therapy

## 2019-02-08 DIAGNOSIS — M542 Cervicalgia: Secondary | ICD-10-CM | POA: Diagnosis not present

## 2019-02-08 NOTE — Therapy (Signed)
[place], [place], [address] Phone: [phone]   Fax:  [phone]  Physical Therapy Treatment  Patient Details  Name: Taylor Hopkins MRN: [id number] Date of Birth: 1950-04-06 Referring Provider (PT): Ernestina Patches   Encounter Date: 02/08/2019  PT End of Session - 02/08/19 1025    Visit Number  3    Number of Visits  12    Date for PT Re-Evaluation  03/14/19    Authorization Type  Medicare    Authorization - Visit Number  14    PT Start Time  4628    PT Stop Time  1100    PT Time Calculation (min)  41 min    Activity Tolerance  Patient tolerated treatment well    Behavior During Therapy  [facility] for tasks assessed/performed       Past Medical History:  Diagnosis Date  . Adenomatous colon polyp 06/2003  . Allergic rhinitis   . Anxiety   . Arthritis   . Cataract 2010   bilateral eyes  . Depression   . GERD (gastroesophageal reflux disease)   . Sleep apnea    Not currently using CPAP - last sleep study 2016    Past Surgical History:  Procedure Laterality Date  . ANKLE SURGERY Right   . BREAST ENHANCEMENT SURGERY    . COLONOSCOPY    . LUMBAR DISC SURGERY    . TONSILLECTOMY    . TOTAL ABDOMINAL HYSTERECTOMY    . UPPER GASTROINTESTINAL ENDOSCOPY      There were no vitals filed for this visit.  Subjective Assessment - 02/08/19 1024    Subjective  Pt states quite a bit of pain relief, with new pillow set up. She reports no headache. Has had minimal pain since last visit.    Currently in Pain?  No/denies    Pain Score  0-No pain                       [facility] Adult PT Treatment/Exercise - 02/08/19 1022      Neck Exercises: Machines for Strengthening   UBE (Upper Arm Bike)  L1 x 4 min total  (fwd/bwd)       Neck Exercises: Theraband   Shoulder Extension  Red    Shoulder Extension Limitations  YTB, Low Row     Rows  20 reps;Green    Shoulder External Rotation  20 reps    Shoulder  External Rotation Limitations  bil ER, YTB      Neck Exercises: Standing   Other Standing Exercises  UE flex and abd x15 each , with education on posture.      Neck Exercises: Seated   Cervical Rotation  --      Neck Exercises: Supine   Other Supine Exercise  Shoulder flexion AROM x10, Horizontal Abd (flys) 2lb,   x20; practice for shoulder  posture       Manual Therapy   Manual Therapy  Passive ROM    Soft tissue mobilization  STM to Bil UTs    Passive ROM  Cervical, manual stretching for Bil UTs    Manual Traction  --      Neck Exercises: Stretches   Upper Trapezius Stretch  3 reps;30 seconds;Right;Left    Other Neck Stretches  Fwd flexion 30 sec x3;     Other Neck Stretches  Supine pec stretch 30 sec x3;  PT Education - 02/08/19 1059    Education Details  Final HEP reviewed     Access Code: [email or web address]    Person(s) Educated  Patient    Methods  Explanation    Comprehension  Verbalized understanding       PT Short Term Goals - 02/08/19 1026      PT SHORT TERM GOAL #1   Title  Pt to be indepenent with initial HEP    Time  2    Period  Weeks    Status  Achieved    Target Date  02/14/19        PT Long Term Goals - 01/31/19 1120      PT LONG TERM GOAL #1   Title  Pt to be independent with final HEP    Time  6    Period  Weeks    Status  New    Target Date  03/14/19      PT LONG TERM GOAL #2   Title  Pt to demo improved cervical rotation, to be [facility] for pt age, with pain 0-1/10 at end range, to improve ability for head turns and driving.    Time  6    Period  Weeks    Status  New    Target Date  03/14/19      PT LONG TERM GOAL #3   Title  Pt to demo soft tissue restrictions to be WNL in cervical musculature, to decrease pain and improve ROM.    Time  6    Period  Weeks    Status  Partially Met    Target Date  03/14/19      PT LONG TERM GOAL #4   Title  Pt to demo decreased pain in neck, to 0-2/10 with activity.    Time  6    Period   Weeks    Status  New    Target Date  03/14/19            Plan - 02/08/19 1151    Clinical Impression Statement  Pt with mild tightness in UT region with manual today. Overall, much decreased pain and headache. She is sleeping better and has been doing HEP. Pt making good progress, likely d/c next visit if pain still decreased.    Examination-Activity Limitations  Reach Overhead;Carry;Lift;Sleep    Examination-Participation Restrictions  Meal Prep;Cleaning;Community Activity;Driving;Laundry;Yard Work    Stability/Clinical Decision Making  Stable/Uncomplicated    Rehab Potential  Good    PT Frequency  2x / week    PT Duration  6 weeks    PT Treatment/Interventions  ADLs/Self Care Home Management;Cryotherapy;Electrical Stimulation;DME Instruction;Ultrasound;Traction;Moist Heat;Iontophoresis 66m/ml Dexamethasone;Functional mobility training;Therapeutic activities;Therapeutic exercise;Neuromuscular re-education;Manual techniques;Patient/family education;Passive range of motion;Dry needling;Taping;Spinal Manipulations;Joint Manipulations    PT Home Exercise Plan  [email or web address]   Consulted and Agree with Plan of Care  Patient       Patient will benefit from skilled therapeutic intervention in order to improve the following deficits and impairments:  Decreased range of motion, Increased muscle spasms, Decreased activity tolerance, Pain, Improper body mechanics, Impaired flexibility, Decreased mobility, Decreased strength  Visit Diagnosis: Cervicalgia     Problem List Patient Active Problem List   Diagnosis Date Noted  . Grief reaction with prolonged bereavement 01/26/2019  . S/P dilatation of esophageal stricture 07/23/2018  . Anxiety 07/23/2018  . Personal history of colonic polyps, followed by Dr. SFuller Plan05/11/2018  . IBS (irritable bowel syndrome) 07/04/2018  . Pure hypercholesterolemia 07/04/2018  .  Chronic neck pain due to DJD, DDD, unable to tolerate NSAIDs 07/04/2018  . Vitamin D  deficiency, on daily supplement 07/04/2018  . Insomnia, Rx prn 1/2 Ambien 07/04/2018  . Osteoporosis, previously on bisphosphonate, with gastritis/dyspepsia 07/04/2018  . Allergic rhinitis 07/04/2018  . Depression, major, single episode, complete remission ([place]) 07/04/2018  . OSA (obstructive sleep apnea), not on CPAP 07/04/2018  . GERD (gastroesophageal reflux disease) 10/02/2008   Lyndee Hensen, PT, DPT 11:58 AM  02/08/19    [facility] [place], [place], [address] Phone: [phone]   Fax:  [phone]  Name: Taylor Hopkins MRN: [id number] Date of Birth: December 31, 1950

## 2019-02-08 NOTE — Patient Instructions (Signed)
Access Code: [email or web address]  URL: [email or web address]  Date: 02/08/2019  Prepared by: Lyndee Hensen   Exercises Standing Backward Shoulder Rolls - 10 reps - 1 sets - 2x daily Neck Rotation - 5 reps - 1 sets - 2x daily Seated Scapular Retraction - 10 reps - 1 sets - 2x daily Seated Upper Trapezius Stretch - 3 reps - 30 hold - 2x daily Seated Levator Scapulae Stretch - 3 reps - 30 hold - 2x daily Supine Pectoralis Stretch - 3 reps - 30 hold - 2x daily Scapular Retraction with Resistance - 10 reps - 2 sets - 1x daily Standing Shoulder External Rotation with Resistance - 10 reps - 2 sets - 2x daily Supine Shoulder Horizontal Abduction with Dumbbells - 10 reps - 2 sets - 1x daily

## 2019-02-10 ENCOUNTER — Encounter: Payer: Self-pay | Admitting: Physical Therapy

## 2019-02-10 ENCOUNTER — Ambulatory Visit (INDEPENDENT_AMBULATORY_CARE_PROVIDER_SITE_OTHER): Payer: Medicare Other | Admitting: Physical Therapy

## 2019-02-10 DIAGNOSIS — M542 Cervicalgia: Secondary | ICD-10-CM | POA: Diagnosis not present

## 2019-02-10 DIAGNOSIS — G8929 Other chronic pain: Secondary | ICD-10-CM | POA: Diagnosis not present

## 2019-02-10 NOTE — Patient Instructions (Signed)
Access Code: [email or web address]  URL: [email or web address]  Date: 02/10/2019  Prepared by: Madelyn Flavors   Exercises Standing Backward Shoulder Rolls - 10 reps - 1 sets - 2x daily Neck Rotation - 5 reps - 1 sets - 2x daily Seated Scapular Retraction - 10 reps - 1 sets - 2x daily Seated Upper Trapezius Stretch - 3 reps - 30 hold - 2x daily Seated Levator Scapulae Stretch - 3 reps - 30 hold - 2x daily Supine Pectoralis Stretch - 3 reps - 30 hold - 2x daily Standing Shoulder External Rotation with Resistance - 10 reps - 2 sets - 2x daily Supine Shoulder Horizontal Abduction with Dumbbells - 10 reps - 2 sets - 1x daily Scapular Retraction with Resistance - 10 reps - 2 sets - 1x daily Shoulder extension with resistance - Neutral - 10 reps - 2 sets - 1x daily - 7x weekly Standing Shoulder External Rotation with Resistance - 10 reps - 2 sets - 1x daily - 7x weekly

## 2019-02-10 NOTE — Therapy (Signed)
[facility] [address] [place], [place], [address] Phone: [phone]   Fax:  [phone]  Physical Therapy Treatment  Patient Details  Name: Taylor Hopkins MRN: [id number] Date of Birth: Dec 14, 1950 Referring Provider (PT): Ernestina Patches   Encounter Date: 02/10/2019  PT End of Session - 02/10/19 1019    Visit Number  4    Number of Visits  12    Date for PT Re-Evaluation  03/14/19    Authorization Type  Medicare    PT Start Time  1019    PT Stop Time  1102    PT Time Calculation (min)  43 min    Activity Tolerance  Patient tolerated treatment well    Behavior During Therapy  [facility] for tasks assessed/performed       Past Medical History:  Diagnosis Date  . Adenomatous colon polyp 06/2003  . Allergic rhinitis   . Anxiety   . Arthritis   . Cataract 2010   bilateral eyes  . Depression   . GERD (gastroesophageal reflux disease)   . Sleep apnea    Not currently using CPAP - last sleep study 2016    Past Surgical History:  Procedure Laterality Date  . ANKLE SURGERY Right   . BREAST ENHANCEMENT SURGERY    . COLONOSCOPY    . LUMBAR DISC SURGERY    . TONSILLECTOMY    . TOTAL ABDOMINAL HYSTERECTOMY    . UPPER GASTROINTESTINAL ENDOSCOPY      There were no vitals filed for this visit.  Subjective Assessment - 02/10/19 1022    Subjective  Pt feels she is ready for d/c. She reports some increased tightness in right upper trap today.    Patient Stated Goals  decreased pain    Currently in Pain?  No/denies                       [facility] Adult PT Treatment/Exercise - 02/10/19 0001      Exercises   Exercises  Neck      Neck Exercises: Machines for Strengthening   UBE (Upper Arm Bike)  L1 x 4 min total  (fwd/bwd)       Neck Exercises: Theraband   Shoulder Extension  Red;20 reps    Rows  20 reps;Green    Shoulder External Rotation  20 reps    Shoulder External Rotation Limitations  bil ER, RTB      Neck  Exercises: Standing   Other Standing Exercises  shoulder rolls broken down into 4 components x 10; scaular retraction x 5, cervical rotation x 5 ea      Neck Exercises: Supine   Other Supine Exercise  shoulder horizontal ABD 2# x 20      Manual Therapy   Manual Therapy  Soft tissue mobilization    Manual therapy comments  skilled palpation and monitoring of soft tissue during DN    Soft tissue mobilization  STM to Bil UTs      Neck Exercises: Stretches   Upper Trapezius Stretch  30 seconds;Right;Left;1 rep    Levator Stretch  Right;Left;1 rep;30 seconds    Other Neck Stretches  Supine pec stretch 30 sec x 2;        Trigger Point Dry Needling - 02/10/19 0001    Consent Given?  Yes    Education Handout Provided  Previously provided    Muscles Treated Head and Neck  Suboccipitals;Levator scapulae;Upper trapezius;Cervical multifidi    Upper Trapezius Response  Twitch reponse elicited;Palpable increased muscle length    Levator Scapulae Response  Palpable increased muscle length    Cervical multifidi Response  Twitch reponse elicited;Palpable increased muscle length           PT Education - 02/10/19 1118    Education Details  HEP    Person(s) Educated  Patient    Methods  Explanation;Demonstration;Handout    Comprehension  Verbalized understanding;Returned demonstration       PT Short Term Goals - 02/08/19 1026      PT SHORT TERM GOAL #1   Title  Pt to be indepenent with initial HEP    Time  2    Period  Weeks    Status  Achieved    Target Date  02/14/19        PT Long Term Goals - 02/10/19 1022      PT LONG TERM GOAL #1   Title  Pt to be independent with final HEP    Status  Achieved      PT LONG TERM GOAL #2   Title  Pt to demo improved cervical rotation, to be [facility] for pt age, with pain 0-1/10 at end range, to improve ability for head turns and driving.    Baseline  --    Status  Achieved      PT LONG TERM GOAL #3   Title  Pt to demo soft tissue  restrictions to be WNL in cervical musculature, to decrease pain and improve ROM.    Baseline  still limited some to left due to right muscle tightness but no pain    Status  Partially Met      PT LONG TERM GOAL #4   Title  Pt to demo decreased pain in neck, to 0-2/10 with activity.    Baseline  Patient reports 2/10 at most with [place] - 02/10/19 1115    Clinical Impression Statement  Patient presented today with increased tightness in her right upper traps, but overall feeling ready to discharge. She has met or partially met all her LTGs and is pleased with her current level of function. She responded very well to DN in her neck today and reported decreased stiffness at end of session. HEP was reviewed and finalized.    PT Treatment/Interventions  ADLs/Self Care Home Management;Cryotherapy;Electrical Stimulation;DME Instruction;Ultrasound;Traction;Moist Heat;Iontophoresis 106m/ml Dexamethasone;Functional mobility training;Therapeutic activities;Therapeutic exercise;Neuromuscular re-education;Manual techniques;Patient/family education;Passive range of motion;Dry needling;Taping;Spinal Manipulations;Joint Manipulations    PT Next Visit Plan  see d/c summary    PT Home Exercise Plan  [id number]   Consulted and Agree with Plan of Care  Patient       Patient will benefit from skilled therapeutic intervention in order to improve the following deficits and impairments:  Decreased range of motion, Increased muscle spasms, Decreased activity tolerance, Pain, Improper body mechanics, Impaired flexibility, Decreased mobility, Decreased strength  Visit Diagnosis: Cervicalgia  Chronic neck pain due to DJD, DDD, unable to tolerate NSAIDs     Problem List Patient Active Problem List   Diagnosis Date Noted  . Grief reaction with prolonged bereavement 01/26/2019  . S/P dilatation of esophageal stricture 07/23/2018  . Anxiety 07/23/2018  . Personal history of  colonic polyps, followed by Dr. SFuller Plan05/11/2018  . IBS (irritable bowel syndrome) 07/04/2018  . Pure hypercholesterolemia 07/04/2018  . Chronic neck pain due to DJD, DDD, unable to tolerate NSAIDs 07/04/2018  .  Vitamin D deficiency, on daily supplement 07/04/2018  . Insomnia, Rx prn 1/2 Ambien 07/04/2018  . Osteoporosis, previously on bisphosphonate, with gastritis/dyspepsia 07/04/2018  . Allergic rhinitis 07/04/2018  . Depression, major, single episode, complete remission ([place]) 07/04/2018  . OSA (obstructive sleep apnea), not on CPAP 07/04/2018  . GERD (gastroesophageal reflux disease) 10/02/2008    Madelyn Flavors PT 02/10/2019, 11:35 AM  [place], [place], [address] Phone: [phone]   Fax:  [phone]  Name: Taylor Hopkins MRN: [id number] Date of Birth: Sep 11, 1950  PHYSICAL THERAPY DISCHARGE SUMMARY  Visits from Start of Care: 4  Current functional level related to goals / functional outcomes: See above   Remaining deficits: See above   Education / Equipment: HEP  Plan: Patient agrees to discharge.  Patient goals were partially met. Patient is being discharged due to being pleased with the current functional level.  ?????    Madelyn Flavors, PT 02/10/19 11:36 AM; [facility] [address] [place], [place], [address] Phone: [phone]   Fax:  [phone]

## 2019-02-22 ENCOUNTER — Other Ambulatory Visit: Payer: Self-pay

## 2019-02-23 ENCOUNTER — Encounter: Payer: Self-pay | Admitting: Family Medicine

## 2019-02-23 ENCOUNTER — Ambulatory Visit (INDEPENDENT_AMBULATORY_CARE_PROVIDER_SITE_OTHER): Payer: Medicare Other | Admitting: Family Medicine

## 2019-02-23 VITALS — Ht 66.75 in | Wt 217.0 lb

## 2019-02-23 DIAGNOSIS — F339 Major depressive disorder, recurrent, unspecified: Secondary | ICD-10-CM | POA: Diagnosis not present

## 2019-02-23 DIAGNOSIS — F419 Anxiety disorder, unspecified: Secondary | ICD-10-CM | POA: Diagnosis not present

## 2019-02-23 DIAGNOSIS — F4321 Adjustment disorder with depressed mood: Secondary | ICD-10-CM

## 2019-02-23 DIAGNOSIS — F4381 Prolonged grief disorder: Secondary | ICD-10-CM

## 2019-02-23 MED ORDER — CLONAZEPAM 0.5 MG PO TABS: 0.5000 mg | ORAL_TABLET | Freq: Two times a day (BID) | ORAL | 0 refills | Status: DC | PRN

## 2019-02-23 NOTE — Progress Notes (Signed)
Virtual Visit via Video Note  SUBJECTIVE CC:  Chief Complaint  Patient presents with  . Depression    I connected with Israela Kelting Sassaman on 02/23/19 at  8:00 AM EST by a video enabled telemedicine application and verified that I am speaking with the correct person using two identifiers. Location patient: Home Location provider: [facility] [place] participating in the virtual visit: FRITZI GRINDE, Leamon Arnt, MD Serita Sheller, [place] discussed the limitations of evaluation and management by telemedicine and the availability of in person appointments. The patient expressed understanding and agreed to proceed.  HPI: Taylor Hopkins is a 68 y.o. female who was contacted today to address the problems listed above in the chief complaint/mood.  Short term f/u for severe anxiety/depression. Reviewed last note. We increased wellbutrin, added klonopin and weaning celexa. Today she is "100% better". Was able to visit grandson, took a trip to [place] with cousin at Christmas and feels bright again. Sleep is not good; no longer on ambien. No AEs from wellbutrin and using klonopin daily in the am.     ASSESSMENT 1. Major depression, recurrent, chronic ([place])   2. Grief reaction with prolonged bereavement   3. Anxiety      Depression/anxiety:  Much improved. Continue meds as is. To completely wean off celexa. Slowly wean daily klonopin. F/u in 8-12 weeks for recheck. If needed, can increase wellbutrin at that time. Counseling done. Needs to keep active with family members.   I discussed the assessment and treatment plan with the patient. The patient was provided an opportunity to ask questions and all were answered. The patient agreed with the plan and demonstrated an understanding of the instructions.   The patient was advised to call back or seek an in-person evaluation if the symptoms worsen or if the condition fails to improve as  anticipated. Follow up: 8-12 weeks to recheck mood  Visit date not found  Meds ordered this encounter  Medications  . clonazePAM (KLONOPIN) 0.5 MG tablet    Sig: Take 1 tablet (0.5 mg total) by mouth 2 (two) times daily as needed for anxiety.    Dispense:  30 tablet    Refill:  0      I reviewed the patients updated PMH, FH, and SocHx.    Patient Active Problem List   Diagnosis Date Noted  . Grief reaction with prolonged bereavement 01/26/2019  . S/P dilatation of esophageal stricture 07/23/2018  . Anxiety 07/23/2018  . Personal history of colonic polyps, followed by Dr. Fuller Plan 07/04/2018  . IBS (irritable bowel syndrome) 07/04/2018  . Pure hypercholesterolemia 07/04/2018  . Chronic neck pain due to DJD, DDD, unable to tolerate NSAIDs 07/04/2018  . Vitamin D deficiency, on daily supplement 07/04/2018  . Insomnia, Rx prn 1/2 Ambien 07/04/2018  . Osteoporosis, previously on bisphosphonate, with gastritis/dyspepsia 07/04/2018  . Allergic rhinitis 07/04/2018  . Depression, major, single episode, complete remission ([place]) 07/04/2018  . OSA (obstructive sleep apnea), not on CPAP 07/04/2018  . GERD (gastroesophageal reflux disease) 10/02/2008   Current Meds  Medication Sig  . BIOTIN PO Take 3,000 mcg by mouth.  Marland Kitchen buPROPion (WELLBUTRIN SR) 150 MG 12 hr tablet Take 1 tablet (150 mg total) by mouth 2 (two) times daily.  . Calcium Citrate-Vitamin D (CALCIUM + D PO) Take by mouth.  . clonazePAM (KLONOPIN) 0.5 MG tablet Take 1 tablet (0.5 mg total) by mouth 2 (two) times daily as  needed for anxiety.  . Cyanocobalamin (VITAMIN B12 PO) Take by mouth.  . dexlansoprazole (DEXILANT) 60 MG capsule Take 1 capsule (60 mg total) by mouth daily.  Marland Kitchen glucose blood test strip Use as instructed  . Probiotic Product (DIGESTIVE ADVANTAGE GUMMIES PO) Take by mouth.  . sucralfate (CARAFATE) 1 g tablet TAKE 1 TABLET BY MOUTH 4 TIMES DAILY - WITH MEALS AND AT BEDTIME. DISSOLVE IN 2 TSP WATER FOR SLURRY  .  triamcinolone (NASACORT) 55 MCG/ACT AERO nasal inhaler Place 2 sprays into the nose daily.  . valACYclovir (VALTREX) 1000 MG tablet Take 2 tablets (2,000 mg total) by mouth 2 (two) times daily. Once, as needed for cold sores  . [DISCONTINUED] citalopram (CELEXA) 40 MG tablet Take 0.5 tablets (20 mg total) by mouth daily for 7 days, THEN 0.5 tablets (20 mg total) every other day for 7 days, THEN 0.5 tablets (20 mg total) 3 (three) times a week for 7 days.  . [DISCONTINUED] clonazePAM (KLONOPIN) 0.5 MG tablet Take 1 tablet (0.5 mg total) by mouth 2 (two) times daily as needed for anxiety.    Allergies: Patient is allergic to sulfonamide derivatives; adhesive  [tape]; and sulfa antibiotics. Family History: Patient family history includes Allergies in her brother; Colon polyps in her father; Diabetes in her mother; Diverticulitis in her father; Esophageal cancer in her maternal grandmother; Heart attack in her father; Heart failure in her father and mother; Kidney failure in her mother; Mitral valve prolapse in her mother; Stomach cancer in her paternal uncle. Social History:  Patient  reports that she has never smoked. She has never used smokeless tobacco. She reports current alcohol use. She reports that she does not use drugs.  Review of Systems: Constitutional: Negative for fever malaise or anorexia Cardiovascular: negative for chest pain Respiratory: negative for SOB or persistent cough Gastrointestinal: negative for abdominal pain  OBJECTIVE/OBSERVATIONS: General: no acute distress, well appearing, no apparent distress, well groomed Psych:  Alert and oriented x 3,normal mood, behavior, speech, dress, and thought processes. smiling  Leamon Arnt, MD

## 2019-03-14 DIAGNOSIS — I781 Nevus, non-neoplastic: Secondary | ICD-10-CM | POA: Diagnosis not present

## 2019-03-14 DIAGNOSIS — L910 Hypertrophic scar: Secondary | ICD-10-CM | POA: Diagnosis not present

## 2019-03-14 DIAGNOSIS — D485 Neoplasm of uncertain behavior of skin: Secondary | ICD-10-CM | POA: Diagnosis not present

## 2019-03-28 ENCOUNTER — Encounter: Payer: Self-pay | Admitting: Family Medicine

## 2019-03-28 ENCOUNTER — Ambulatory Visit (INDEPENDENT_AMBULATORY_CARE_PROVIDER_SITE_OTHER): Payer: Medicare Other | Admitting: Family Medicine

## 2019-03-28 VITALS — Ht 66.75 in | Wt 210.0 lb

## 2019-03-28 DIAGNOSIS — F4321 Adjustment disorder with depressed mood: Secondary | ICD-10-CM | POA: Diagnosis not present

## 2019-03-28 DIAGNOSIS — F4381 Prolonged grief disorder: Secondary | ICD-10-CM

## 2019-03-28 DIAGNOSIS — F339 Major depressive disorder, recurrent, unspecified: Secondary | ICD-10-CM

## 2019-03-28 DIAGNOSIS — F325 Major depressive disorder, single episode, in full remission: Secondary | ICD-10-CM

## 2019-03-28 MED ORDER — FLUOXETINE HCL 20 MG PO TABS: ORAL_TABLET | ORAL | 2 refills | Status: DC

## 2019-03-28 NOTE — Progress Notes (Signed)
Virtual Visit via Video Note  SUBJECTIVE CC:  Chief Complaint  Patient presents with  . Depression    Patient has been taking Klonopin 1/2 tab bid and welbutrin daily. increase in symptoms over the last two weeks.     I connected with Taylor Hopkins on 03/28/19 at  3:40 PM EST by a video enabled telemedicine application and verified that I am speaking with the correct person using two identifiers. Location patient: Home Location provider: [facility] [place] participating in the virtual visit: Taylor Hopkins, Leamon Arnt, MD Taylor Hopkins, [place] discussed the limitations of evaluation and management by telemedicine and the availability of in person appointments. The patient expressed understanding and agreed to proceed.  HPI: Taylor Hopkins is a 69 y.o. female who was contacted today to address the problems listed above in the chief complaint/mood.  Doing poorly again. Was seen about a month ago and was "great"! Had granddaughter last week and felt well then; however, when she is home alone, she is feeling down. Having crying spells and apathy. No panic. On klonopin - didn't wean but using 0.25 bid. wellbutrin since may... weaned off celexa and feels wellbutrin helps.   Depression screen [facility] 2/9 03/28/2019 02/23/2019 11/19/2018  Decreased Interest 2 0 0  Down, Depressed, Hopeless 2 0 0  PHQ - 2 Score 4 0 0  Altered sleeping 2 0 3  Tired, decreased energy 2 0 1  Change in appetite 2 0 1  Feeling bad or failure about yourself  2 0 1  Trouble concentrating 2 1 0  Moving slowly or fidgety/restless 0 0 0  Suicidal thoughts 0 0 0  PHQ-9 Score 14 1 6   Difficult doing work/chores Very difficult Not difficult at all Not difficult at all   GAD 7 : Generalized Anxiety Score 07/22/2018 07/02/2018  Nervous, Anxious, on Edge 3 1  Control/stop worrying 3 2  Worry too much - different things 3 2  Trouble relaxing 3 1  Restless 3 1  Easily annoyed  or irritable 3 0  Afraid - awful might happen 1 0  Total GAD 7 Score 19 7  Anxiety Difficulty Somewhat difficult Somewhat difficult     ASSESSMENT 1. Major depression, recurrent, chronic ([place])   2. Grief reaction with prolonged bereavement      Depression:  More active; much is situational. Work on keeping active with social stimulation. She is looking for employment. Consider counseling. Add prozac to wellbutrin. Stop klonopin. Recheck 4-6 weeks .  I discussed the assessment and treatment plan with the patient. The patient was provided an opportunity to ask questions and all were answered. The patient agreed with the plan and demonstrated an understanding of the instructions.   The patient was advised to call back or seek an in-person evaluation if the symptoms worsen or if the condition fails to improve as anticipated. Follow up: No follow-ups on file.  Visit date not found  No orders of the defined types were placed in this encounter.     I reviewed the patients updated PMH, FH, and SocHx.    Patient Active Problem List   Diagnosis Date Noted  . Grief reaction with prolonged bereavement 01/26/2019  . S/P dilatation of esophageal stricture 07/23/2018  . Anxiety 07/23/2018  . Personal history of colonic polyps, followed by Dr. Fuller Plan 07/04/2018  . IBS (irritable bowel syndrome) 07/04/2018  . Pure hypercholesterolemia 07/04/2018  . Chronic  neck pain due to DJD, DDD, unable to tolerate NSAIDs 07/04/2018  . Vitamin D deficiency, on daily supplement 07/04/2018  . Insomnia, Rx prn 1/2 Ambien 07/04/2018  . Osteoporosis, previously on bisphosphonate, with gastritis/dyspepsia 07/04/2018  . Allergic rhinitis 07/04/2018  . Depression, major, single episode, complete remission ([place]) 07/04/2018  . OSA (obstructive sleep apnea), not on CPAP 07/04/2018  . GERD (gastroesophageal reflux disease) 10/02/2008   Current Meds  Medication Sig  . BIOTIN PO Take 3,000 mcg by mouth.  Marland Kitchen  buPROPion (WELLBUTRIN SR) 150 MG 12 hr tablet Take 1 tablet (150 mg total) by mouth 2 (two) times daily.  . Calcium Citrate-Vitamin D (CALCIUM + D PO) Take by mouth.  . clonazePAM (KLONOPIN) 0.5 MG tablet Take 1 tablet (0.5 mg total) by mouth 2 (two) times daily as needed for anxiety.  . Cyanocobalamin (VITAMIN B12 PO) Take by mouth.  . dexlansoprazole (DEXILANT) 60 MG capsule Take 1 capsule (60 mg total) by mouth daily.  Marland Kitchen glucose blood test strip Use as instructed  . Probiotic Product (DIGESTIVE ADVANTAGE GUMMIES PO) Take by mouth.  . sucralfate (CARAFATE) 1 g tablet TAKE 1 TABLET BY MOUTH 4 TIMES DAILY - WITH MEALS AND AT BEDTIME. DISSOLVE IN 2 TSP WATER FOR SLURRY  . triamcinolone (NASACORT) 55 MCG/ACT AERO nasal inhaler Place 2 sprays into the nose daily.  . valACYclovir (VALTREX) 1000 MG tablet Take 2 tablets (2,000 mg total) by mouth 2 (two) times daily. Once, as needed for cold sores    Allergies: Patient is allergic to sulfonamide derivatives; adhesive  [tape]; and sulfa antibiotics. Family History: Patient family history includes Allergies in her brother; Colon polyps in her father; Diabetes in her mother; Diverticulitis in her father; Esophageal cancer in her maternal grandmother; Heart attack in her father; Heart failure in her father and mother; Kidney failure in her mother; Mitral valve prolapse in her mother; Stomach cancer in her paternal uncle. Social History:  Patient  reports that she has never smoked. She has never used smokeless tobacco. She reports current alcohol use. She reports that she does not use drugs.  Review of Systems: Constitutional: Negative for fever malaise or anorexia Cardiovascular: negative for chest pain Respiratory: negative for SOB or persistent cough Gastrointestinal: negative for abdominal pain  OBJECTIVE/OBSERVATIONS: General: no acute distress, well appearing, no apparent distress, well groomed Psych:  Alert and oriented x 3,normal mood,  behavior, speech, dress, and thought processes.   Leamon Arnt, MD

## 2019-03-29 ENCOUNTER — Telehealth: Payer: Self-pay | Admitting: Family Medicine

## 2019-03-29 NOTE — Telephone Encounter (Signed)
Pt called stating she has a sinus infection. Pt states she forgot to ask Dr. Jonni Sanger for antibiotics in their appt yesterday. Please advise.

## 2019-03-29 NOTE — Telephone Encounter (Signed)
Needs virtual visit. Need to evaluate.

## 2019-03-29 NOTE — Telephone Encounter (Signed)
Called and pt refused to make appt.

## 2019-03-29 NOTE — Telephone Encounter (Signed)
FYI

## 2019-03-30 DIAGNOSIS — M7061 Trochanteric bursitis, right hip: Secondary | ICD-10-CM | POA: Diagnosis not present

## 2019-03-30 DIAGNOSIS — M7062 Trochanteric bursitis, left hip: Secondary | ICD-10-CM | POA: Diagnosis not present

## 2019-03-30 NOTE — Telephone Encounter (Signed)
noted 

## 2019-03-30 NOTE — Telephone Encounter (Signed)
Patient refused app want me to call and advise not seen no abx or she can go to urgent care for evaluation?

## 2019-04-01 ENCOUNTER — Ambulatory Visit: Payer: Medicare Other | Attending: Internal Medicine

## 2019-04-01 ENCOUNTER — Ambulatory Visit (INDEPENDENT_AMBULATORY_CARE_PROVIDER_SITE_OTHER): Payer: Medicare Other | Admitting: Family Medicine

## 2019-04-01 ENCOUNTER — Encounter: Payer: Self-pay | Admitting: Family Medicine

## 2019-04-01 VITALS — BP 130/79 | HR 83 | Temp 96.0°F | Ht 66.75 in | Wt 210.0 lb

## 2019-04-01 DIAGNOSIS — J0100 Acute maxillary sinusitis, unspecified: Secondary | ICD-10-CM | POA: Diagnosis not present

## 2019-04-01 DIAGNOSIS — J069 Acute upper respiratory infection, unspecified: Secondary | ICD-10-CM

## 2019-04-01 DIAGNOSIS — Z20822 Contact with and (suspected) exposure to covid-19: Secondary | ICD-10-CM

## 2019-04-01 MED ORDER — AMOXICILLIN-POT CLAVULANATE 875-125 MG PO TABS: 1.0000 | ORAL_TABLET | Freq: Two times a day (BID) | ORAL | 0 refills | Status: DC

## 2019-04-01 NOTE — Progress Notes (Signed)
Virtual Visit via Video Note  Subjective  CC:  Chief Complaint  Patient presents with  . Sinusitis    Nasal drainage, yellowish/greenish mucus with blood. Sx started Monday.  . Sore Throat  . Cough     I connected with Aanyah Kohen Sadlon on 04/01/19 at 10:00 AM EST by a video enabled telemedicine application and verified that I am speaking with the correct person using two identifiers. Location patient: Home Location provider: [facility] [place], Office Persons participating in the virtual visit: RYAN DUCRE, Leamon Arnt, MD Serita Sheller, Shepherd discussed the limitations of evaluation and management by telemedicine and the availability of in person appointments. The patient expressed understanding and agreed to proceed. HPI: NYEASHA VOLKERT is a 69 y.o. female who was contacted today to address the problems listed above in the chief complaint. . Sinus infection: congested with sinus pressure, thick mucopurulent drainage, and ST. Mild cough. No body aches or fever. No loss of taste smell. Some mild loose stools. No dental or facial pain. Pt does not feel she has had any covid exposure. No sob or abdominal pain Assessment  1. Acute non-recurrent maxillary sinusitis   2. Upper respiratory tract infection, unspecified type      Plan   Possible bacterial sinusitis vs uri vs covid:  Will get testing done. Instructions given. Home isolation until improved or negative test result. Empiric abx with augmentin and supportive care I discussed the assessment and treatment plan with the patient. The patient was provided an opportunity to ask questions and all were answered. The patient agreed with the plan and demonstrated an understanding of the instructions.   The patient was advised to call back or seek an in-person evaluation if the symptoms worsen or if the condition fails to improve as anticipated. Follow up: prn  Visit date not found  Meds ordered  this encounter  Medications  . amoxicillin-clavulanate (AUGMENTIN) 875-125 MG tablet    Sig: Take 1 tablet by mouth 2 (two) times daily.    Dispense:  14 tablet    Refill:  0      I reviewed the patients updated PMH, FH, and SocHx.    Patient Active Problem List   Diagnosis Date Noted  . Grief reaction with prolonged bereavement 01/26/2019  . S/P dilatation of esophageal stricture 07/23/2018  . Anxiety 07/23/2018  . Personal history of colonic polyps, followed by Dr. Fuller Plan 07/04/2018  . IBS (irritable bowel syndrome) 07/04/2018  . Chronic neck pain due to DJD, DDD, unable to tolerate NSAIDs 07/04/2018  . Vitamin D deficiency, on daily supplement 07/04/2018  . Insomnia, Rx prn 1/2 Ambien 07/04/2018  . Osteoporosis, previously on bisphosphonate, with gastritis/dyspepsia 07/04/2018  . Allergic rhinitis 07/04/2018  . Depression, major, single episode, complete remission ([place]) 07/04/2018  . OSA (obstructive sleep apnea), not on CPAP 07/04/2018  . GERD (gastroesophageal reflux disease) 10/02/2008   Current Meds  Medication Sig  . BIOTIN PO Take 3,000 mcg by mouth.  . Calcium Citrate-Vitamin D (CALCIUM + D PO) Take by mouth.  . Cyanocobalamin (VITAMIN B12 PO) Take by mouth.  . dexlansoprazole (DEXILANT) 60 MG capsule Take 1 capsule (60 mg total) by mouth daily.  Marland Kitchen FLUoxetine (PROZAC) 20 MG tablet Take 0.5 tablets (10 mg total) by mouth daily for 7 days, THEN 1 tablet (20 mg total) daily.  Marland Kitchen glucose blood test strip Use as instructed  . Probiotic Product (DIGESTIVE ADVANTAGE GUMMIES PO)  Take by mouth.  . sucralfate (CARAFATE) 1 g tablet TAKE 1 TABLET BY MOUTH 4 TIMES DAILY - WITH MEALS AND AT BEDTIME. DISSOLVE IN 2 TSP WATER FOR SLURRY  . triamcinolone (NASACORT) 55 MCG/ACT AERO nasal inhaler Place 2 sprays into the nose daily.  . valACYclovir (VALTREX) 1000 MG tablet Take 2 tablets (2,000 mg total) by mouth 2 (two) times daily. Once, as needed for cold sores    Allergies: Patient  is allergic to sulfonamide derivatives; adhesive  [tape]; and sulfa antibiotics. Family History: Patient family history includes Allergies in her brother; Colon polyps in her father; Diabetes in her mother; Diverticulitis in her father; Esophageal cancer in her maternal grandmother; Heart attack in her father; Heart failure in her father and mother; Kidney failure in her mother; Mitral valve prolapse in her mother; Stomach cancer in her paternal uncle. Social History:  Patient  reports that she has never smoked. She has never used smokeless tobacco. She reports current alcohol use. She reports that she does not use drugs.  Review of Systems: Constitutional: Negative for fever malaise or anorexia Cardiovascular: negative for chest pain Respiratory: negative for SOB or persistent cough Gastrointestinal: negative for abdominal pain  OBJECTIVE Vitals: BP 130/79 (BP Location: Left Wrist)   Pulse 83   Temp (!) 96 F (35.6 C) (Temporal)   Ht 5' 6.75" (1.695 m)   Wt 210 lb (95.3 kg)   BMI 33.14 kg/m  General: no acute distress , A&Ox3 Congested but no respiratory distress  Leamon Arnt, MD

## 2019-04-02 LAB — NOVEL CORONAVIRUS, NAA: SARS-CoV-2, NAA: NOT DETECTED

## 2019-04-20 ENCOUNTER — Telehealth: Payer: Self-pay

## 2019-04-20 NOTE — Telephone Encounter (Signed)
Please advise 

## 2019-04-20 NOTE — Telephone Encounter (Signed)
Please stop the prozac.   Please schedule in person office visit for assessment.

## 2019-04-20 NOTE — Telephone Encounter (Signed)
Left message on patient's voicemail to stop prozac and to call office back to schedule appt with Dr. Jonni Sanger for an in office assessment.  Please schedule patient to come in to be seen by Dr. Jonni Sanger.

## 2019-04-20 NOTE — Telephone Encounter (Signed)
Patient states that the FLUoxetine (PROZAC) 20 MG tablet is making her nausea, unable to sleep at night, sweating more often , patient has lost 14 pounds in two weeks and patient do not have appetite. Patient would like something that cause less side effects. Please return patient call as soon as possible.

## 2019-04-22 ENCOUNTER — Other Ambulatory Visit: Payer: Self-pay | Admitting: Family Medicine

## 2019-04-22 NOTE — Telephone Encounter (Signed)
LAST APPOINTMENT DATE: 04/01/2019  NEXT APPOINTMENT DATE:@3 /02/2019   LAST REFILL: 03/28/2019  QTY: 60 TABLET

## 2019-04-25 ENCOUNTER — Encounter: Payer: Self-pay | Admitting: Family Medicine

## 2019-04-25 ENCOUNTER — Other Ambulatory Visit: Payer: Self-pay

## 2019-04-25 ENCOUNTER — Ambulatory Visit (INDEPENDENT_AMBULATORY_CARE_PROVIDER_SITE_OTHER): Payer: Medicare Other | Admitting: Family Medicine

## 2019-04-25 VITALS — BP 126/78 | HR 88 | Temp 98.0°F | Ht 66.75 in | Wt 212.8 lb

## 2019-04-25 DIAGNOSIS — K2100 Gastro-esophageal reflux disease with esophagitis, without bleeding: Secondary | ICD-10-CM

## 2019-04-25 DIAGNOSIS — F339 Major depressive disorder, recurrent, unspecified: Secondary | ICD-10-CM

## 2019-04-25 DIAGNOSIS — T50905A Adverse effect of unspecified drugs, medicaments and biological substances, initial encounter: Secondary | ICD-10-CM

## 2019-04-25 DIAGNOSIS — F419 Anxiety disorder, unspecified: Secondary | ICD-10-CM

## 2019-04-25 MED ORDER — BUPROPION HCL 75 MG PO TABS: 75.0000 mg | ORAL_TABLET | Freq: Two times a day (BID) | ORAL | 3 refills | Status: DC

## 2019-04-25 NOTE — Progress Notes (Signed)
Subjective  CC:  Chief Complaint  Patient presents with  . Depression    HPI: Taylor Hopkins is a 69 y.o. female who presents to the office today to address the problems listed above in the chief complaint, mood problems.  Pt presents for f/u of depression and adverse effects. Reports that Prozac is causing nausea and decreased appetite. Stopped it 2 weeks ago and now GI sxs have resolved. We started it about 4 weeks ago.   She reports her mood is "Good" again. On feb 1, she was down again and prozac was added to wellbutrin 150. She stopped all her meds due to GI effects and prefers not to be on them.  Has chronic gerd well controlled on dexilant  She has had chronic anxiety treated with celexa for years. Depression was worsened this year due to multiple negative life circumstances.   She now feels that if she remains active, she will do well.   Our visits corroborate this: if she is active and with family she feels well. If she is home alone, she gets down quickly.  NO SI. Depression screen [facility] 2/9 03/28/2019 02/23/2019 11/19/2018  Decreased Interest 2 0 0  Down, Depressed, Hopeless 2 0 0  PHQ - 2 Score 4 0 0  Altered sleeping 2 0 3  Tired, decreased energy 2 0 1  Change in appetite 2 0 1  Feeling bad or failure about yourself  2 0 1  Trouble concentrating 2 1 0  Moving slowly or fidgety/restless 0 0 0  Suicidal thoughts 0 0 0  PHQ-9 Score 14 1 6   Difficult doing work/chores Very difficult Not difficult at all Not difficult at all     Assessment  1. Major depression, recurrent, chronic ([place])   2. Anxiety   3. Adverse effect of drug, initial encounter   4. Gastroesophageal reflux disease with esophagitis without hemorrhage      Plan   Depression/anxiety. She will mainly manage behaviorally. Stop prozac to GI adverse effects. Stop klonopin. rec continuation of low dose wellbutrin due to chronic depression/anxiety. Pt agrees.  GERD: continue long term dexilant  Add  prozac to allergies due to n/v and decreased appetite. Weight is stable   Wt Readings from Last 3 Encounters:  04/25/19 212 lb 12.8 oz (96.5 kg)  04/01/19 210 lb (95.3 kg)  03/28/19 210 lb (95.3 kg)     Follow up: may for cpe  No orders of the defined types were placed in this encounter.  Meds ordered this encounter  Medications  . buPROPion (WELLBUTRIN) 75 MG tablet    Sig: Take 1 tablet (75 mg total) by mouth 2 (two) times daily.    Dispense:  180 tablet    Refill:  3      I reviewed the patients updated PMH, FH, and SocHx.    Patient Active Problem List   Diagnosis Date Noted  . Grief reaction with prolonged bereavement 01/26/2019  . S/P dilatation of esophageal stricture 07/23/2018  . Anxiety 07/23/2018  . Personal history of colonic polyps, followed by Dr. Fuller Plan 07/04/2018  . IBS (irritable bowel syndrome) 07/04/2018  . Chronic neck pain due to DJD, DDD, unable to tolerate NSAIDs 07/04/2018  . Vitamin D deficiency, on daily supplement 07/04/2018  . Insomnia, Rx prn 1/2 Ambien 07/04/2018  . Osteoporosis, previously on bisphosphonate, with gastritis/dyspepsia 07/04/2018  . Allergic rhinitis 07/04/2018  . Depression, major, single episode, complete remission ([place]) 07/04/2018  . OSA (obstructive sleep apnea), not  on CPAP 07/04/2018  . GERD (gastroesophageal reflux disease) 10/02/2008   Current Meds  Medication Sig  . BIOTIN PO Take 3,000 mcg by mouth.  . Calcium Citrate-Vitamin D (CALCIUM + D PO) Take by mouth.  . Cyanocobalamin (VITAMIN B12 PO) Take by mouth.  . dexlansoprazole (DEXILANT) 60 MG capsule Take 1 capsule (60 mg total) by mouth daily.  Marland Kitchen glucose blood test strip Use as instructed  . Probiotic Product (DIGESTIVE ADVANTAGE GUMMIES PO) Take by mouth.  . sucralfate (CARAFATE) 1 g tablet TAKE 1 TABLET BY MOUTH 4 TIMES DAILY - WITH MEALS AND AT BEDTIME. DISSOLVE IN 2 TSP WATER FOR SLURRY  . triamcinolone (NASACORT) 55 MCG/ACT AERO nasal inhaler Place 2 sprays  into the nose daily.  . valACYclovir (VALTREX) 1000 MG tablet Take 2 tablets (2,000 mg total) by mouth 2 (two) times daily. Once, as needed for cold sores    Allergies: Patient is allergic to prozac [fluoxetine hcl]; sulfonamide derivatives; adhesive  [tape]; and sulfa antibiotics. Family history:  Patient family history includes Allergies in her brother; Colon polyps in her father; Diabetes in her mother; Diverticulitis in her father; Esophageal cancer in her maternal grandmother; Heart attack in her father; Heart failure in her father and mother; Kidney failure in her mother; Mitral valve prolapse in her mother; Stomach cancer in her paternal uncle. Social History   Socioeconomic History  . Marital status: Married    Spouse name: Coralyn Mark  . Number of children: 2  . Years of education: Not on file  . Highest education level: Not on file  Occupational History    Employer: RETIRED    Comment: Engineer  Tobacco Use  . Smoking status: Never Smoker  . Smokeless tobacco: Never Used  Substance and Sexual Activity  . Alcohol use: Yes    Alcohol/week: 0.0 standard drinks    Comment: 1 per month  . Drug use: No  . Sexual activity: Yes    Birth control/protection: Post-menopausal  Other Topics Concern  . Not on file  Social History Narrative  . Not on file   Social Determinants of Health   Financial Resource Strain:   . Difficulty of Paying Living Expenses: Not on file  Food Insecurity:   . Worried About Charity fundraiser in the Last Year: Not on file  . Ran Out of Food in the Last Year: Not on file  Transportation Needs:   . Lack of Transportation (Medical): Not on file  . Lack of Transportation (Non-Medical): Not on file  Physical Activity: Insufficiently Active  . Days of Exercise per Week: 3 days  . Minutes of Exercise per Session: 30 min  Stress: Stress Concern Present  . Feeling of Stress : To some extent  Social Connections:   . Frequency of Communication with Friends  and Family: Not on file  . Frequency of Social Gatherings with Friends and Family: Not on file  . Attends Religious Services: Not on file  . Active Member of Clubs or Organizations: Not on file  . Attends Archivist Meetings: Not on file  . Marital Status: Not on file     Review of Systems: Constitutional: Negative for fever malaise or anorexia Cardiovascular: negative for chest pain Respiratory: negative for SOB or persistent cough Gastrointestinal: negative for abdominal pain  Objective  Vitals: BP 126/78   Pulse 88   Temp 98 F (36.7 C) (Temporal)   Ht 5' 6.75" (1.695 m)   Wt 212 lb 12.8 oz (96.5 kg)  SpO2 96%   BMI 33.58 kg/m  General: no acute distress, well appearing, no apparent distress, well groomed Psych:  Alert and oriented x 3,normal mood, behavior, speech, dress, and thought processes.  Gastrointestinal: soft, flat abdomen, normal active bowel sounds, no palpable masses, no hepatosplenomegaly, no appreciated hernias     Commons side effects, risks, benefits, and alternatives for medications and treatment plan prescribed today were discussed, and the patient expressed understanding of the given instructions. Patient is instructed to call or message via MyChart if he/she has any questions or concerns regarding our treatment plan. No barriers to understanding were identified. We discussed Red Flag symptoms and signs in detail. Patient expressed understanding regarding what to do in case of urgent or emergency type symptoms.   Medication list was reconciled, printed and provided to the patient in AVS. Patient instructions and summary information was reviewed with the patient as documented in the AVS. This note was prepared with assistance of Dragon voice recognition software. Occasional wrong-word or sound-a-like substitutions may have occurred due to the inherent limitations of voice recognition software

## 2019-04-25 NOTE — Patient Instructions (Signed)
Please return in May 2021 for your annual complete physical; please come fasting.  Restart wellbutrin 75mg  twice a day and keep active.   If you have any questions or concerns, please don't hesitate to send me a message via MyChart or call the office at [phone]. Thank you for visiting with [place] today! It's our pleasure caring for you.

## 2019-04-26 DIAGNOSIS — D485 Neoplasm of uncertain behavior of skin: Secondary | ICD-10-CM | POA: Diagnosis not present

## 2019-04-26 DIAGNOSIS — L308 Other specified dermatitis: Secondary | ICD-10-CM | POA: Diagnosis not present

## 2019-06-03 DIAGNOSIS — Z23 Encounter for immunization: Secondary | ICD-10-CM | POA: Diagnosis not present

## 2019-06-06 ENCOUNTER — Telehealth: Payer: Self-pay | Admitting: Family Medicine

## 2019-06-06 NOTE — Telephone Encounter (Signed)
Error

## 2019-06-13 DIAGNOSIS — M545 Low back pain: Secondary | ICD-10-CM | POA: Diagnosis not present

## 2019-06-14 ENCOUNTER — Encounter: Payer: Self-pay | Admitting: Family Medicine

## 2019-06-14 ENCOUNTER — Other Ambulatory Visit: Payer: Self-pay | Admitting: Family Medicine

## 2019-06-15 DIAGNOSIS — M5106 Intervertebral disc disorders with myelopathy, lumbar region: Secondary | ICD-10-CM | POA: Diagnosis not present

## 2019-06-16 ENCOUNTER — Encounter: Payer: Self-pay | Admitting: Family Medicine

## 2019-06-24 DIAGNOSIS — Z23 Encounter for immunization: Secondary | ICD-10-CM | POA: Diagnosis not present

## 2019-07-13 ENCOUNTER — Encounter: Payer: Self-pay | Admitting: Gastroenterology

## 2019-07-13 ENCOUNTER — Ambulatory Visit (INDEPENDENT_AMBULATORY_CARE_PROVIDER_SITE_OTHER): Payer: Medicare Other | Admitting: Gastroenterology

## 2019-07-13 ENCOUNTER — Other Ambulatory Visit (INDEPENDENT_AMBULATORY_CARE_PROVIDER_SITE_OTHER): Payer: Medicare Other

## 2019-07-13 VITALS — BP 126/72 | HR 76 | Ht 66.75 in | Wt 207.0 lb

## 2019-07-13 DIAGNOSIS — R1012 Left upper quadrant pain: Secondary | ICD-10-CM

## 2019-07-13 DIAGNOSIS — K219 Gastro-esophageal reflux disease without esophagitis: Secondary | ICD-10-CM | POA: Diagnosis not present

## 2019-07-13 DIAGNOSIS — Z86010 Personal history of colonic polyps: Secondary | ICD-10-CM

## 2019-07-13 LAB — CBC WITH DIFFERENTIAL/PLATELET
Basophils Absolute: 0.1 10*3/uL (ref 0.0–0.1)
Basophils Relative: 1.4 % (ref 0.0–3.0)
Eosinophils Absolute: 0.1 10*3/uL (ref 0.0–0.7)
Eosinophils Relative: 1.1 % (ref 0.0–5.0)
HCT: 39.3 % (ref 36.0–46.0)
Hemoglobin: 13.5 g/dL (ref 12.0–15.0)
Lymphocytes Relative: 22.2 % (ref 12.0–46.0)
Lymphs Abs: 1.5 10*3/uL (ref 0.7–4.0)
MCHC: 34.5 g/dL (ref 30.0–36.0)
MCV: 95.8 fl (ref 78.0–100.0)
Monocytes Absolute: 0.6 10*3/uL (ref 0.1–1.0)
Monocytes Relative: 8.3 % (ref 3.0–12.0)
Neutro Abs: 4.5 10*3/uL (ref 1.4–7.7)
Neutrophils Relative %: 67 % (ref 43.0–77.0)
Platelets: 156 10*3/uL (ref 150.0–400.0)
RBC: 4.1 Mil/uL (ref 3.87–5.11)
RDW: 13.5 % (ref 11.5–15.5)
WBC: 6.7 10*3/uL (ref 4.0–10.5)

## 2019-07-13 LAB — COMPREHENSIVE METABOLIC PANEL
ALT: 22 U/L (ref 0–35)
AST: 27 U/L (ref 0–37)
Albumin: 4.2 g/dL (ref 3.5–5.2)
Alkaline Phosphatase: 43 U/L (ref 39–117)
BUN: 12 mg/dL (ref 6–23)
CO2: 30 mEq/L (ref 19–32)
Calcium: 9.3 mg/dL (ref 8.4–10.5)
Chloride: 104 mEq/L (ref 96–112)
Creatinine, Ser: 0.88 mg/dL (ref 0.40–1.20)
GFR: 63.57 mL/min (ref >60.00)
Glucose, Bld: 89 mg/dL (ref 70–99)
Potassium: 3.7 mEq/L (ref 3.5–5.1)
Sodium: 142 mEq/L (ref 135–145)
Total Bilirubin: 0.6 mg/dL (ref 0.2–1.2)
Total Protein: 7 g/dL (ref 6.0–8.3)

## 2019-07-13 LAB — LIPASE: Lipase: 16 U/L (ref 11.0–59.0)

## 2019-07-13 NOTE — Patient Instructions (Signed)
Your provider has requested that you go to the basement level for lab work before leaving today. Press "B" on the elevator. The lab is located at the first door on the left as you exit the elevator.  You will be due for a recall colonoscopy in 06/2021. We will send you a reminder in the mail when it gets closer to that time.  Thank you for choosing me and [facility].  Taylor Hopkins. Dagoberto Ligas., MD., [facility]

## 2019-07-13 NOTE — Progress Notes (Signed)
    History of Present Illness: This is a 69 year old female with a history of GERD and esophageal stricture.  Last EGD 1 year ago as below.  She states she has heartburn with certain foods.  In general her reflux is well controlled on Dexilant.  She noted mild left upper quadrant pain with mild diarrhea after eating a cheese steak sub the other day.  She has had similar symptoms after eating steak.  The symptoms have resolved quickly on their own and have not persisted.  A lumbar spine MRI without contrast was performed on June 13, 2019 in addition to spine findings outlined in the report cholelithiasis was noted.   EGD 06/2018 - Benign-appearing esophageal stenosis. Dilated. - Erythematous mucosa in the gastric fundus and gastric body. Biopsied. - Small hiatal hernia. - Normal duodenal bulb and second portion of the duodenum.  Current Medications, Allergies, Past Medical History, Past Surgical History, Family History and Social History were reviewed in [employer] record.   Physical Exam: General: Well developed, well nourished, no acute distress Head: Normocephalic and atraumatic Eyes:  sclerae anicteric, EOMI Ears: Normal auditory acuity Mouth: Not examined, mask on during Covid-19 pandemic Lungs: Clear throughout to auscultation Heart: Regular rate and rhythm; no murmurs, rubs or bruits Abdomen: Soft, non tender and non distended. No masses, hepatosplenomegaly or hernias noted. Normal Bowel sounds Rectal: Deferred to colonoscopy Musculoskeletal: Symmetrical with no gross deformities  Pulses:  Normal pulses noted Extremities: No clubbing, cyanosis, edema or deformities noted Neurological: Alert oriented x 4, grossly nonfocal Psychological:  Alert and cooperative. Normal mood and affect   Assessment and Recommendations:  1. GERD, history of esophageal stricture.  Closely follow antireflux measures.  Try to avoid foods that exacerbate symptoms.  Continue  Dexliant 60 mg qd. TUMS prn.   2.  Episodes of LUQ pain, diarrhea are likely high fat food intolerances.  CMP, CBC, lipase today.   3.  Cholelithiasis, asymptomatic.   4. Personal history of precancerous colon polyps. Surveillance colonoscopy recommended in May 2023.

## 2019-07-14 ENCOUNTER — Telehealth: Payer: Self-pay | Admitting: Gastroenterology

## 2019-07-14 NOTE — Telephone Encounter (Signed)
Left message for patient that her lab results were normal and if she has any questions she can call me back.

## 2019-07-14 NOTE — Telephone Encounter (Signed)
Patient returned your call about results, please call patient one more time.   

## 2019-07-26 ENCOUNTER — Other Ambulatory Visit: Payer: Self-pay

## 2019-07-26 ENCOUNTER — Encounter: Payer: Self-pay | Admitting: Family Medicine

## 2019-07-26 ENCOUNTER — Ambulatory Visit (INDEPENDENT_AMBULATORY_CARE_PROVIDER_SITE_OTHER): Payer: Medicare Other | Admitting: Family Medicine

## 2019-07-26 VITALS — BP 138/80 | HR 81 | Temp 98.0°F | Resp 15 | Ht 66.0 in | Wt 203.6 lb

## 2019-07-26 DIAGNOSIS — M5416 Radiculopathy, lumbar region: Secondary | ICD-10-CM | POA: Diagnosis not present

## 2019-07-26 DIAGNOSIS — F5101 Primary insomnia: Secondary | ICD-10-CM | POA: Diagnosis not present

## 2019-07-26 DIAGNOSIS — M810 Age-related osteoporosis without current pathological fracture: Secondary | ICD-10-CM

## 2019-07-26 DIAGNOSIS — K2100 Gastro-esophageal reflux disease with esophagitis, without bleeding: Secondary | ICD-10-CM

## 2019-07-26 DIAGNOSIS — J301 Allergic rhinitis due to pollen: Secondary | ICD-10-CM

## 2019-07-26 DIAGNOSIS — E559 Vitamin D deficiency, unspecified: Secondary | ICD-10-CM | POA: Diagnosis not present

## 2019-07-26 DIAGNOSIS — R7303 Prediabetes: Secondary | ICD-10-CM | POA: Diagnosis not present

## 2019-07-26 DIAGNOSIS — K8020 Calculus of gallbladder without cholecystitis without obstruction: Secondary | ICD-10-CM | POA: Diagnosis not present

## 2019-07-26 DIAGNOSIS — E785 Hyperlipidemia, unspecified: Secondary | ICD-10-CM | POA: Diagnosis not present

## 2019-07-26 DIAGNOSIS — F325 Major depressive disorder, single episode, in full remission: Secondary | ICD-10-CM

## 2019-07-26 HISTORY — DX: Calculus of gallbladder without cholecystitis without obstruction: K80.20

## 2019-07-26 LAB — HEMOGLOBIN A1C: Hgb A1c MFr Bld: 5.8 % (ref 4.6–6.5)

## 2019-07-26 LAB — VITAMIN D 25 HYDROXY (VIT D DEFICIENCY, FRACTURES): VITD: 22.33 ng/mL — ABNORMAL LOW (ref 30.00–100.00)

## 2019-07-26 LAB — LIPID PANEL
Cholesterol: 153 mg/dL (ref 0–200)
HDL: 33.6 mg/dL — ABNORMAL LOW (ref >39.00)
NonHDL: 119.42
Total CHOL/HDL Ratio: 5
Triglycerides: 247 mg/dL — ABNORMAL HIGH (ref 0.0–149.0)
VLDL: 49.4 mg/dL — ABNORMAL HIGH (ref 0.0–40.0)

## 2019-07-26 LAB — LDL CHOLESTEROL, DIRECT: Direct LDL: 72 mg/dL

## 2019-07-26 LAB — TSH: TSH: 1.69 u[IU]/mL (ref 0.35–4.50)

## 2019-07-26 MED ORDER — GABAPENTIN 300 MG PO CAPS
ORAL_CAPSULE | ORAL | 5 refills | Status: DC
Start: 2019-07-26 — End: 2020-02-20

## 2019-07-26 MED ORDER — TRAZODONE HCL 50 MG PO TABS: 25.0000 mg | ORAL_TABLET | Freq: Every evening | ORAL | 3 refills | Status: DC | PRN

## 2019-07-26 NOTE — Patient Instructions (Signed)
Please return in 6 months recheck   I will release your lab results to you on your MyChart account with further instructions. Please reply with any questions.   Please start flonase and allegra or zyrtec daily for your allergy symptoms Start the neurontin (gabapentin) nightly for your back/leg nerve pain. It may help you sleep. If needed and tolerated you may add a second dose in the mornings.  Get your eyes examined when you can.    If you have any questions or concerns, please don't hesitate to send me a message via MyChart or call the office at [phone]. Thank you for visiting with [place] today! It's our pleasure caring for you.

## 2019-07-26 NOTE — Progress Notes (Signed)
Subjective  Chief Complaint  Patient presents with  . Annual Exam    fasting  . Sinus Problem    cough, congestion and runny nose  . Hip Pain    burns and itching sensation, dull ache/pain    HPI: Taylor Hopkins is a 69 y.o. female who presents to [facility] [place] today for a Female Wellness Visit. She also has the concerns and/or needs as listed above in the chief complaint. These will be addressed in addition to the Health Maintenance Visit.   Wellness Visit: annual visit with health maintenance review and exam without Pap   HM: has mammo scheduled. Dexa: reviewed last 2019: osteopenia - had been on fosamax and received a few doses of prolia:however I don't have the prior dexa. ? H/o osteoporosis due to h/o fracture or by dexa. Pt is uncertain.  Currently on vit d and calcium Chronic disease f/u and/or acute problem visit: (deemed necessary to be done in addition to the wellness visit):  [place]: c/o rhinorrhea, pnd and cough w/o sinus pain o rsob  Mood: doing better. On low dose wellbutrin. Improving since vaccinated and can get out more  GERD: recently seen by GI: notes reviewed. On PPI.   Hip and low back pain: lumbar stenosis treated by Dr. Alfonso Ramus in past. C/o worsening with burning pain and paresthesias. No weakness. Has h/o lumbar surgery and epidural steroid injections.   Insomnia: ran out of trazodone: had been helping. Would like refill  H/o prediabetes: is eating better and weight is down. No sxs of hyperglycemia, polyuria or polydipsia.  Gallstones incidental finding on MRI: evaluated by GI  Assessment  1. Gastroesophageal reflux disease with esophagitis without hemorrhage   2. Depression, major, single episode, complete remission ([place])   3. Age related osteoporosis, unspecified pathological fracture presence   4. Vitamin D deficiency, on daily supplement   5. Prediabetes   6. Dyslipidemia   7. Seasonal allergic rhinitis due to pollen    8. Lumbar radiculopathy, chronic   9. Primary insomnia   10. Gallstones      Plan  Female Wellness Visit:  Age appropriate Health Maintenance and Prevention measures were discussed with patient. Included topics are cancer screening recommendations, ways to keep healthy (see AVS) including dietary and exercise recommendations, regular eye and dental care, use of seat belts, and avoidance of moderate alcohol use and tobacco use. Pt getting mammogram at [place].  BMI: discussed patient's BMI and encouraged positive lifestyle modifications to help get to or maintain a target BMI.  HM needs and immunizations were addressed and ordered. See below for orders. See HM and immunization section for updates.  Routine labs and screening tests ordered including cmp, cbc and lipids where appropriate.  Discussed recommendations regarding Vit D and calcium supplementation (see AVS)  Chronic disease management visit and/or acute problem visit:  Osteopenia vs osteoporosis: off meds and will recheck in September. Then will treat appropriately. Need records of h/o fracture and prior dexa.   Mood is controlled. No change in meds  H/o vit deficiencies. Due for recheck.   Prediabetes: education on diet and recheck labs.   Back and hip pain: start gabapentin to see if helps. F/u with ortho as directed.   Gerd/gallstones: stable   Insomnia: well controlled with trazodone. Refilled but recommend trial of neurontin first as this may help as well.   Allergies: reassured. Restart flonase and allegra or zyrtec.  Follow up: Return in about 6 months (around  01/25/2020) for recheck, mood follow up.  Orders Placed This Encounter  Procedures  . Lipid panel  . TSH  . Hemoglobin A1c  . VITAMIN D 25 Hydroxy (Vit-D Deficiency, Fractures)  . LDL cholesterol, direct   Meds ordered this encounter  Medications  . gabapentin (NEURONTIN) 300 MG capsule    Sig: Take 1 capsule (300 mg total) by mouth at bedtime for  14 days, THEN 1 capsule (300 mg total) 2 (two) times daily.    Dispense:  60 capsule    Refill:  5  . traZODone (DESYREL) 50 MG tablet    Sig: Take 0.5-1 tablets (25-50 mg total) by mouth at bedtime as needed for sleep.    Dispense:  90 tablet    Refill:  3      Lifestyle: Body mass index is 32.86 kg/m. Wt Readings from Last 3 Encounters:  07/26/19 203 lb 9.6 oz (92.4 kg)  07/13/19 207 lb (93.9 kg)  04/25/19 212 lb 12.8 oz (96.5 kg)    Patient Active Problem List   Diagnosis Date Noted  . Lumbar radiculopathy, chronic 07/26/2019  . Seasonal allergic rhinitis due to pollen 07/26/2019  . Prediabetes 07/26/2019  . Gallstones 07/26/2019    asymtomatic   . Grief reaction with prolonged bereavement 01/26/2019  . S/P dilatation of esophageal stricture 07/23/2018  . Anxiety 07/23/2018  . Colon polyp 07/04/2018  . IBS (irritable bowel syndrome) 07/04/2018  . Chronic neck pain due to DJD, DDD, unable to tolerate NSAIDs 07/04/2018  . Vitamin D deficiency 07/04/2018  . Insomnia 07/04/2018  . Osteoporosis 07/04/2018    ? H/o osteoporosis, failed biphosphonates due to gastritis. Don't have record. Last dexa 2019 with osteopenia T = -2.0 lowest.    . Allergic rhinitis 07/04/2018  . Depression, major, single episode, complete remission ([place]) 07/04/2018     failed zoloft, celexa, trazadone, and effexor and lexapro Failed prozac due to side effects: nausea    . OSA (obstructive sleep apnea), not on CPAP 07/04/2018  . GERD (gastroesophageal reflux disease) 10/02/2008   Health Maintenance  Topic Date Due  . TETANUS/TDAP  Never done  . MAMMOGRAM  08/13/2019  . INFLUENZA VACCINE  09/25/2019  . PNA vac Low Risk Adult (2 of 2 - PCV13) 11/19/2019  . DEXA SCAN  08/12/2020  . COLONOSCOPY  07/16/2021  . COVID-19 Vaccine  Completed  . Hepatitis C Screening  Completed   Immunization History  Administered Date(s) Administered  . Fluad Quad(high Dose 65+) 11/19/2018  . PFIZER SARS-COV-2  Vaccination 06/03/2019, 06/24/2019  . Pneumococcal Polysaccharide-23 11/19/2018   We updated and reviewed the patient's past history in detail and it is documented below. Allergies: Patient is allergic to prozac [fluoxetine hcl]; sulfonamide derivatives; adhesive  [tape]; and sulfa antibiotics. Past Medical History Patient  has a past medical history of Adenomatous colon polyp (06/2003), Allergic rhinitis, Anxiety, Arthritis, Cataract (2010), Depression, Gallstones (07/26/2019), GERD (gastroesophageal reflux disease), and Sleep apnea. Past Surgical History Patient  has a past surgical history that includes Tonsillectomy; Lumbar disc surgery; Total abdominal hysterectomy; Ankle surgery (Right); Breast enhancement surgery; Colonoscopy; and Upper gastrointestinal endoscopy. Family History: Patient family history includes Allergies in her brother; Colon polyps in her father; Diabetes in her mother; Diverticulitis in her father; Esophageal cancer in her maternal grandmother; Heart attack in her father; Heart failure in her father and mother; Kidney failure in her mother; Mitral valve prolapse in her mother; Stomach cancer in her paternal uncle. Social History:  Patient  reports that she  has never smoked. She has never used smokeless tobacco. She reports current alcohol use. She reports that she does not use drugs.  Review of Systems: Constitutional: negative for fever or malaise Ophthalmic: negative for photophobia, double vision or loss of vision Cardiovascular: negative for chest pain, dyspnea on exertion, or new LE swelling Respiratory: negative for SOB or persistent cough Gastrointestinal: negative for abdominal pain, change in bowel habits or melena Genitourinary: negative for dysuria or gross hematuria, no abnormal uterine bleeding or disharge Musculoskeletal: negative for new gait disturbance or muscular weakness Integumentary: negative for new or persistent rashes, no breast  lumps Neurological: negative for TIA or stroke symptoms Psychiatric: negative for SI or delusions Allergic/Immunologic: negative for hives  Patient Care Team    Relationship Specialty Notifications Start End  Leamon Arnt, MD PCP - General Family Medicine  01/26/19   Lady Gary, [facility] Consulting Physician Gynecology  07/02/18    Comment: Nathaneil Canary, MD Consulting Physician Sports Medicine  07/02/18   Ladene Artist, MD Consulting Physician Gastroenterology  07/02/18   Magnus Sinning, MD Consulting Physician Physical Medicine and Rehabilitation  11/19/18     Objective  Vitals: BP 138/80   Pulse 81   Temp 98 F (36.7 C) (Temporal)   Resp 15   Ht 5\' 6"  (1.676 m)   Wt 203 lb 9.6 oz (92.4 kg)   SpO2 98%   BMI 32.86 kg/m  General:  Well developed, well nourished, no acute distress  Psych:  Alert and orientedx3,normal mood and affect HEENT:  Normocephalic, atraumatic, non-icteric sclera,  supple neck without adenopathy, mass or thyromegaly Cardiovascular:  Normal S1, S2, RRR without gallop, rub or murmur Respiratory:  Good breath sounds bilaterally, CTAB with normal respiratory effort Gastrointestinal: normal bowel sounds, soft, non-tender, no noted masses. No HSM MSK: no deformities, contusions. Joints are without erythema or swelling.  Skin:  Warm, no rashes or suspicious lesions noted Neurologic:    Mental status is normal. CN 2-11 are normal. Gross motor and sensory exams are normal. Normal gait. No tremor Breast Exam: No mass, skin retraction or nipple discharge is appreciated in either breast. No axillary adenopathy. Fibrocystic changes are not noted    Commons side effects, risks, benefits, and alternatives for medications and treatment plan prescribed today were discussed, and the patient expressed understanding of the given instructions. Patient is instructed to call or message via MyChart if he/she has any questions or concerns regarding our  treatment plan. No barriers to understanding were identified. We discussed Red Flag symptoms and signs in detail. Patient expressed understanding regarding what to do in case of urgent or emergency type symptoms.   Medication list was reconciled, printed and provided to the patient in AVS. Patient instructions and summary information was reviewed with the patient as documented in the AVS. This note was prepared with assistance of Dragon voice recognition software. Occasional wrong-word or sound-a-like substitutions may have occurred due to the inherent limitations of voice recognition software  This visit occurred during the SARS-CoV-2 public health emergency.  Safety protocols were in place, including screening questions prior to the visit, additional usage of staff PPE, and extensive cleaning of exam room while observing appropriate contact time as indicated for disinfecting solutions.

## 2019-07-28 DIAGNOSIS — S338XXA Sprain of other parts of lumbar spine and pelvis, initial encounter: Secondary | ICD-10-CM | POA: Diagnosis not present

## 2019-07-29 ENCOUNTER — Encounter: Payer: Self-pay | Admitting: Family Medicine

## 2019-08-19 DIAGNOSIS — S338XXA Sprain of other parts of lumbar spine and pelvis, initial encounter: Secondary | ICD-10-CM | POA: Diagnosis not present

## 2019-08-31 DIAGNOSIS — S338XXA Sprain of other parts of lumbar spine and pelvis, initial encounter: Secondary | ICD-10-CM | POA: Diagnosis not present

## 2019-09-07 DIAGNOSIS — S338XXA Sprain of other parts of lumbar spine and pelvis, initial encounter: Secondary | ICD-10-CM | POA: Diagnosis not present

## 2019-09-14 DIAGNOSIS — S338XXA Sprain of other parts of lumbar spine and pelvis, initial encounter: Secondary | ICD-10-CM | POA: Diagnosis not present

## 2019-09-23 DIAGNOSIS — Z1231 Encounter for screening mammogram for malignant neoplasm of breast: Secondary | ICD-10-CM | POA: Diagnosis not present

## 2019-09-23 LAB — HM MAMMOGRAPHY

## 2019-10-20 ENCOUNTER — Encounter: Payer: Self-pay | Admitting: Family Medicine

## 2019-10-28 ENCOUNTER — Telehealth: Payer: Self-pay

## 2019-10-28 NOTE — Telephone Encounter (Signed)
Please advise if you would like patient re-enrolled with Prolia injections.

## 2019-11-07 NOTE — Telephone Encounter (Signed)
Reviewed dexa scan results (got scanned in without crossing my desk for review). Last dexa 6.2020 - osteopenia t = -2.1 lowest.  Rec repeat in June 2022 and will restart prolia IF worsening.   Please notify pt. Thanks.

## 2019-11-09 DIAGNOSIS — L578 Other skin changes due to chronic exposure to nonionizing radiation: Secondary | ICD-10-CM | POA: Diagnosis not present

## 2019-11-09 DIAGNOSIS — L814 Other melanin hyperpigmentation: Secondary | ICD-10-CM | POA: Diagnosis not present

## 2019-11-09 DIAGNOSIS — L304 Erythema intertrigo: Secondary | ICD-10-CM | POA: Diagnosis not present

## 2019-11-09 DIAGNOSIS — L570 Actinic keratosis: Secondary | ICD-10-CM | POA: Diagnosis not present

## 2019-11-09 DIAGNOSIS — L821 Other seborrheic keratosis: Secondary | ICD-10-CM | POA: Diagnosis not present

## 2019-11-17 ENCOUNTER — Other Ambulatory Visit: Payer: Self-pay | Admitting: Family Medicine

## 2019-11-17 DIAGNOSIS — K2100 Gastro-esophageal reflux disease with esophagitis, without bleeding: Secondary | ICD-10-CM

## 2019-12-18 ENCOUNTER — Other Ambulatory Visit: Payer: Self-pay | Admitting: Family Medicine

## 2019-12-18 DIAGNOSIS — K2100 Gastro-esophageal reflux disease with esophagitis, without bleeding: Secondary | ICD-10-CM

## 2019-12-19 ENCOUNTER — Encounter: Payer: Self-pay | Admitting: Family Medicine

## 2019-12-20 ENCOUNTER — Other Ambulatory Visit: Payer: Self-pay

## 2019-12-20 DIAGNOSIS — K2100 Gastro-esophageal reflux disease with esophagitis, without bleeding: Secondary | ICD-10-CM

## 2019-12-20 MED ORDER — DEXILANT 60 MG PO CPDR: 60.0000 mg | DELAYED_RELEASE_CAPSULE | Freq: Every day | ORAL | 1 refills | Status: DC

## 2019-12-21 ENCOUNTER — Telehealth: Payer: Self-pay | Admitting: Family Medicine

## 2019-12-21 NOTE — Telephone Encounter (Signed)
Left message for patient to call back and schedule Medicare Annual Wellness Visit (AWV) either virtually/audio only OR in office. Whatever the patients preference is.  Last AWV 11/19/18; please schedule at anytime with [facility] Advisor at [facility].  This should be a 45 minute visit.

## 2020-02-20 ENCOUNTER — Other Ambulatory Visit: Payer: Self-pay

## 2020-02-20 ENCOUNTER — Telehealth (INDEPENDENT_AMBULATORY_CARE_PROVIDER_SITE_OTHER): Payer: Medicare Other | Admitting: Family Medicine

## 2020-02-20 ENCOUNTER — Encounter: Payer: Self-pay | Admitting: Family Medicine

## 2020-02-20 DIAGNOSIS — F5101 Primary insomnia: Secondary | ICD-10-CM

## 2020-02-20 DIAGNOSIS — M542 Cervicalgia: Secondary | ICD-10-CM | POA: Diagnosis not present

## 2020-02-20 DIAGNOSIS — F339 Major depressive disorder, recurrent, unspecified: Secondary | ICD-10-CM | POA: Insufficient documentation

## 2020-02-20 DIAGNOSIS — F5104 Psychophysiologic insomnia: Secondary | ICD-10-CM

## 2020-02-20 DIAGNOSIS — G8929 Other chronic pain: Secondary | ICD-10-CM

## 2020-02-20 IMAGING — MR MRI CERVICAL SPINE WITHOUT CONTRAST
5 series · 29 of 48 positions shown · non-contrast
Comparison: MRI cervical spine dated March 30, 2006.

CLINICAL DATA: Chronic neck pain radiating into the left shoulder
and arm.

EXAM:
MRI CERVICAL SPINE WITHOUT CONTRAST
TECHNIQUE: Multiplanar, multisequence MR imaging of the cervical spine was
performed. No intravenous contrast was administered.

[Series 2: T2 · sagittal · 3.0mm · 0.41mm/px · 6 of 13 slices shown (1 of 2)]
[im 1/13]
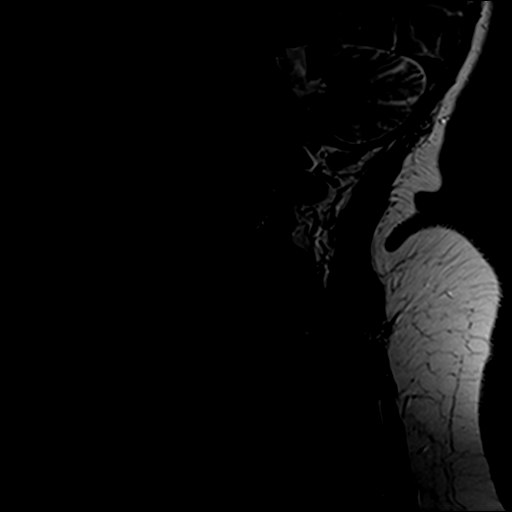
[im 3/13]
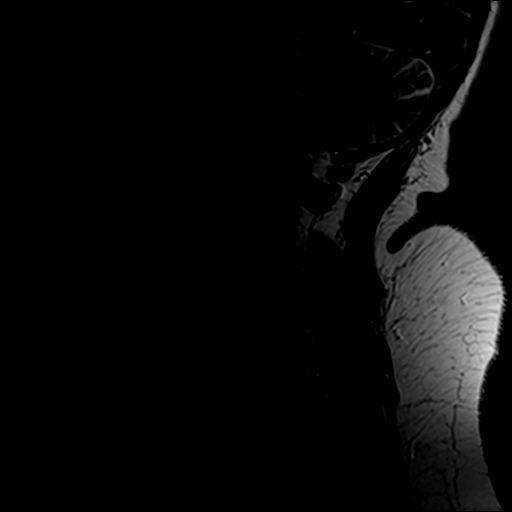
[im 5/13]
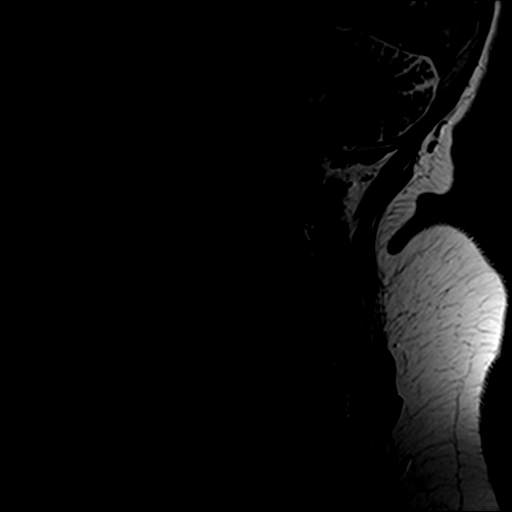
[im 8/13]
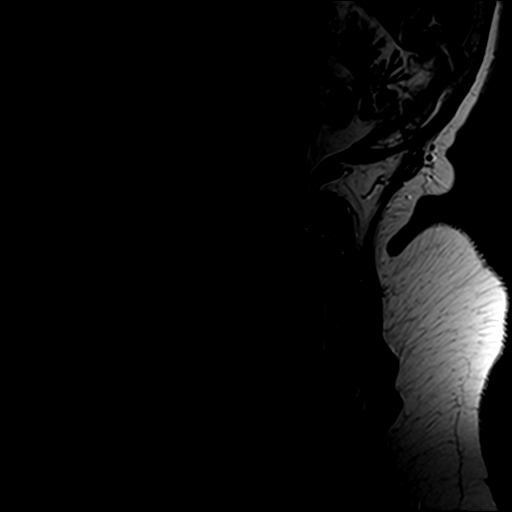
[im 10/13]
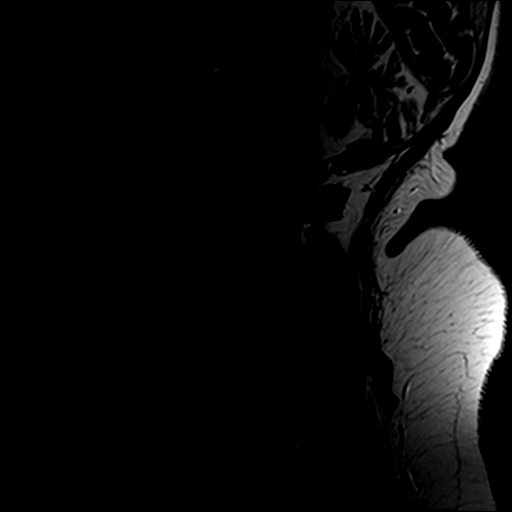
[im 13/13]
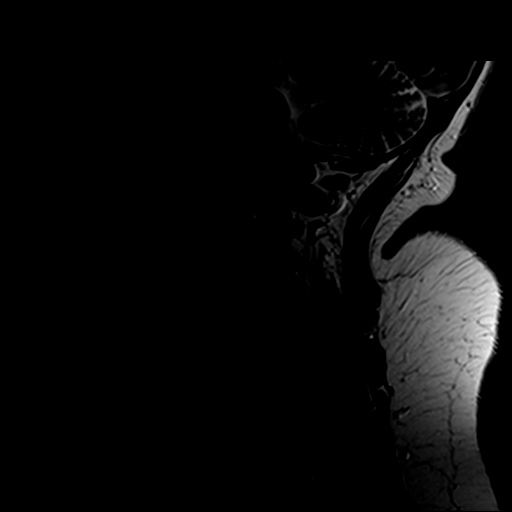

[Series 3: T1 · sagittal · 3.0mm · 0.41mm/px · 7 of 13 slices shown]
[im 1/13]
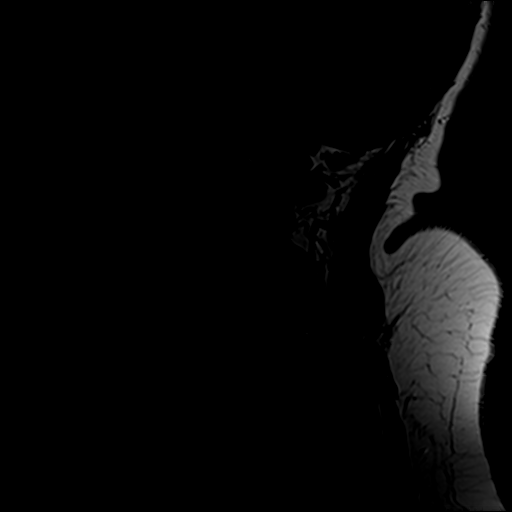
[im 3/13]
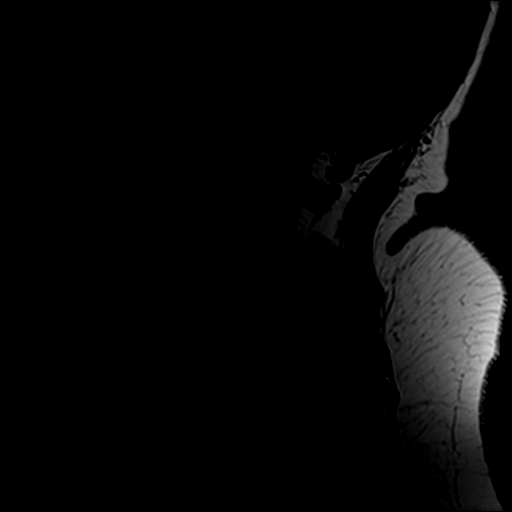
[im 5/13]
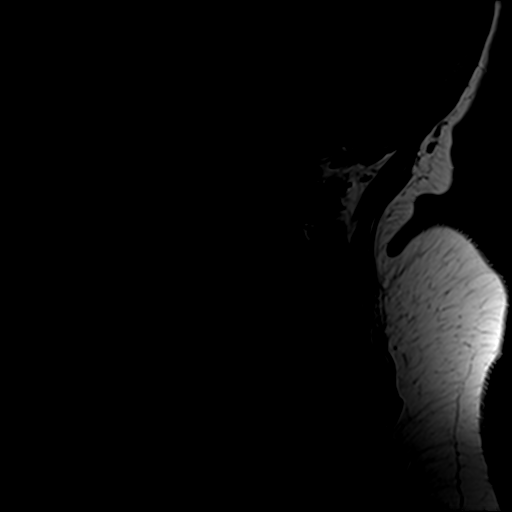
[im 7/13]
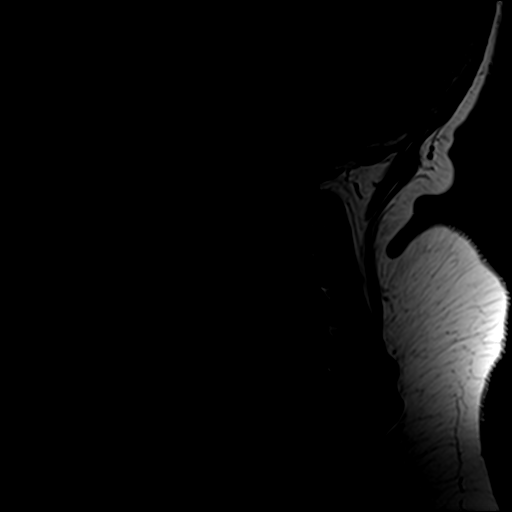
[im 9/13]
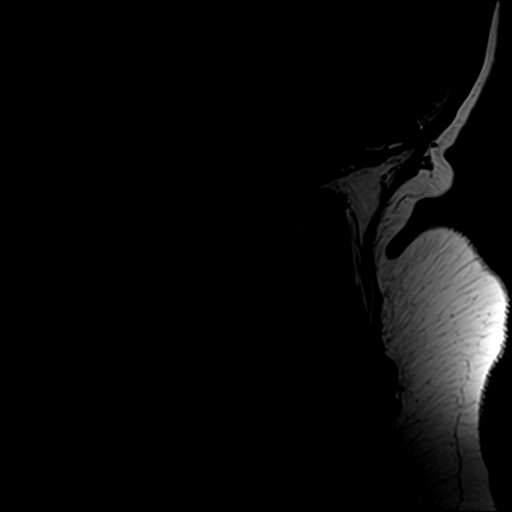
[im 11/13]
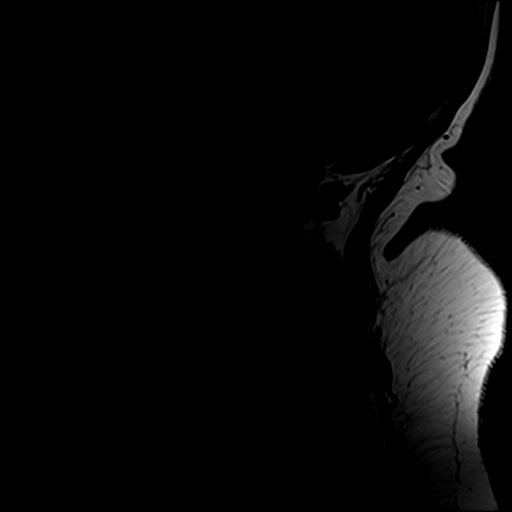
[im 13/13]
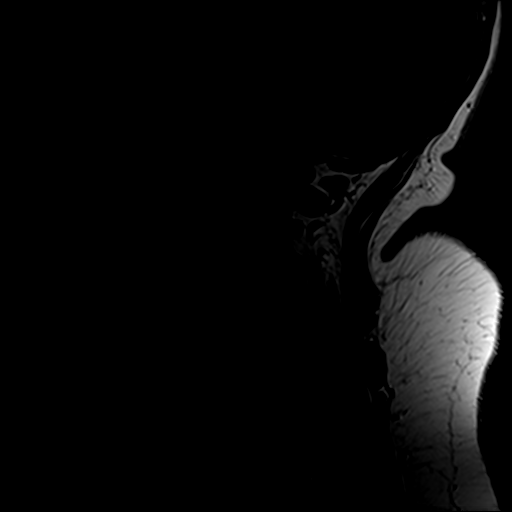

[Series 4: STIR · sagittal · 3.0mm · 0.82mm/px · 7 of 13 slices shown]
[im 1/13]
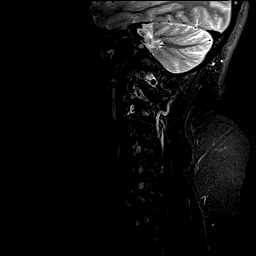
[im 3/13]
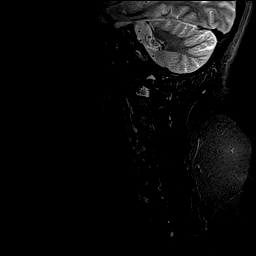
[im 5/13]
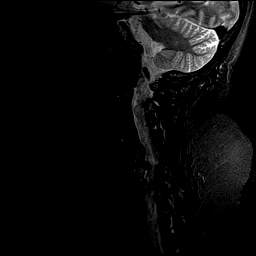
[im 7/13]
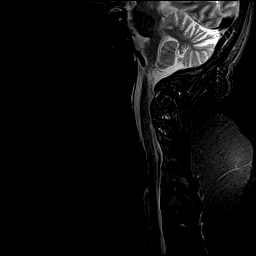
[im 9/13]
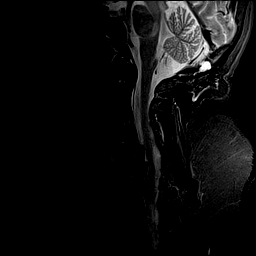
[im 11/13]
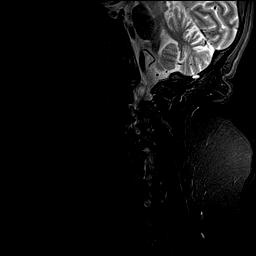
[im 13/13]
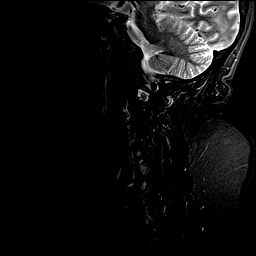

[Series 5: GRE · axial · 3.0mm · 0.35mm/px · 1 of 26 slices shown]
[im 1/26]
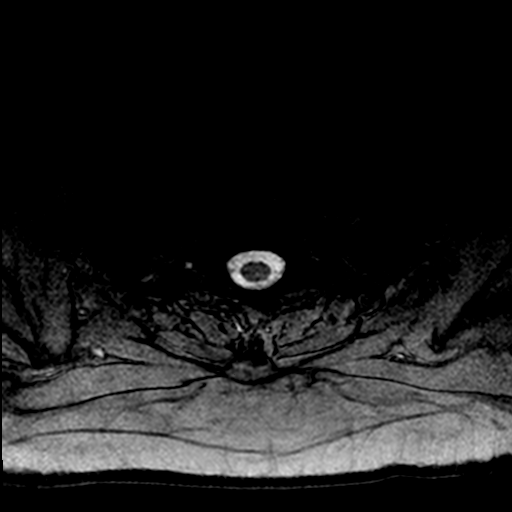

[Series 6: T2 · axial · 3.0mm · 0.70mm/px · z∈[-67,+25]mm · 8 of 26 slices shown (2 of 2)]
[im 1/26]
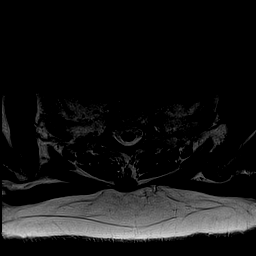
[im 4/26]
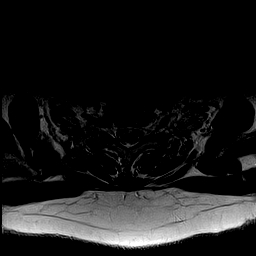
[im 8/26]
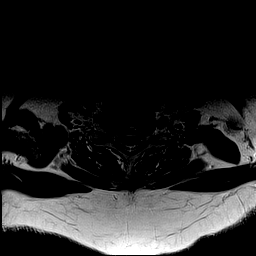
[im 12/26]
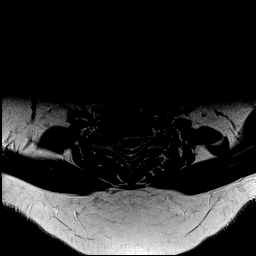
[im 14/26]
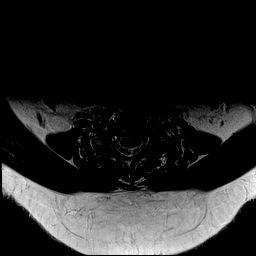
[im 18/26]
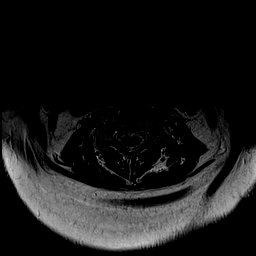
[im 22/26]
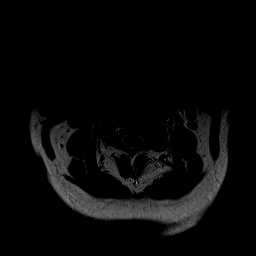
[im 26/26]
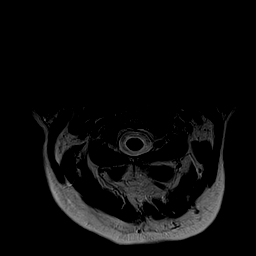

[29 of 48 positions shown; findings below may reference images not displayed]

FINDINGS: Alignment: Increased reversal of the normal cervical lordosis. New 2
mm anterolisthesis at C3-C4. Unchanged 2 mm anterolisthesis at
C4-C5.

Vertebrae: No fracture, evidence of discitis, or bone lesion.

Cord: Normal signal and morphology.

Posterior Fossa, vertebral arteries, paraspinal tissues: Negative.

Disc levels:

C2-C3: New tiny central disc protrusion. Progressive moderate left
facet arthropathy. No stenosis.

C3-C4: Negative disc. Progressive moderate bilateral facet
arthropathy. No stenosis.

C4-C5: Unchanged mild disc bulging. Progressive moderate left and
mild right facet arthropathy. No stenosis.

C5-C6: Progress right-sided disc osteophyte complex and severe right
uncovertebral hypertrophy. Progressive severe right neuroforaminal
stenosis. No spinal canal or left neuroforaminal stenosis.

C6-C7: Unchanged small posterior disc osteophyte complex with
progressive mild to moderate uncovertebral hypertrophy. New moderate
bilateral neuroforaminal stenosis. No spinal canal stenosis.

C7-T1: Negative disc. Progressive mild to moderate bilateral facet
arthropathy. No stenosis.

Unchanged small central disc protrusion at T2-T3.
IMPRESSION: 1. Progressive multilevel cervical spondylosis as described above.
New moderate bilateral neuroforaminal stenosis at C6-C7.
2. Progressive severe right neuroforaminal stenosis at C5-C6.

## 2020-02-20 MED ORDER — GABAPENTIN 300 MG PO CAPS
300.0000 mg | ORAL_CAPSULE | Freq: Two times a day (BID) | ORAL | 3 refills | Status: DC
Start: 2020-02-20 — End: 2021-07-04

## 2020-02-20 MED ORDER — BUSPIRONE HCL 7.5 MG PO TABS: 7.5000 mg | ORAL_TABLET | Freq: Three times a day (TID) | ORAL | 2 refills | Status: DC | PRN

## 2020-02-20 MED ORDER — TRAZODONE HCL 50 MG PO TABS: 50.0000 mg | ORAL_TABLET | Freq: Every evening | ORAL | 3 refills | Status: DC | PRN

## 2020-02-20 NOTE — Patient Instructions (Signed)
Please return in 6 weeks for recheck of mood and sleep  If you have any questions or concerns, please don't hesitate to send me a message via MyChart or call the office at [phone]. Thank you for visiting with [place] today! It's our pleasure caring for you.

## 2020-02-20 NOTE — Progress Notes (Signed)
Subjective  CC:  Chief Complaint  Patient presents with  . Anxiety  . Insomnia    Trouble falling and staying asleep, sleeping around 4 hours a night, previously prescribed Trazadone - states it did not help with insomnia  . Medication Refill    Gabapentin 300 mg once nightly   Virtual Visit via Video Note I connected with Taylor Hopkins on 02/20/20 at 10:30 AM EST by a video enabled telemedicine application and verified that I am speaking with the correct person using two identifiers. Location patient: Home Location provider: [facility] Persons participating in the virtual visit: DAMILOLA FLAMM, Willow Ora, MD Adela Glimpse, CMA  I discussed the limitations of evaluation and management by telemedicine and the availability of in person appointments. The patient expressed understanding and agreed to proceed.  HPI: Taylor Hopkins is a 69 y.o. female who presents to the office today to address the problems listed above in the chief complaint, mood problems.  Taylor Hopkins presents due to symptoms of depression and anxiety over the last 2 weeks.  She suffers from intermittent recurrent depression.  Next June she was last here she was doing well.  However, she was working a part-time job that increased her stress.  She quit the job last week.  Reports worsening sleep over the last 2 weeks.  Also mood is down.  All the time tends to be a hard time for her as well.  She tends to do well if she is active.  She continues on Wellbutrin.  She has failed multiple SSRIs in the past.  Tends to bounce out of depression pretty quickly if she can have interactions with family or friends.  She is working to increase these interactions. She denies current suicidal or homicidal plan or intent.   Chronic anxiety worsened over the last 2 weeks due to stressors.  She had been on gabapentin twice daily for sleep and chronic back pain.  She ran out about a month ago.  She thinks  this might have helped her.  She also was on trazodone but this is no longer very effective for the last 2 weeks no adverse effects  Chronic neck pain mostly stable.  No new symptoms   Depression screen [facility] 2/9 02/20/2020 07/26/2019 03/28/2019  Decreased Interest 2 1 2   Down, Depressed, Hopeless 2 1 2   PHQ - 2 Score 4 2 4   Altered sleeping 3 3 2   Tired, decreased energy 3 1 2   Change in appetite 3 1 2   Feeling bad or failure about yourself  3 1 2   Trouble concentrating 3 1 2   Moving slowly or fidgety/restless 0 0 0  Suicidal thoughts 0 0 0  PHQ-9 Score 19 9 14   Difficult doing work/chores Very difficult - Very difficult   GAD 7 : Generalized Anxiety Score 02/20/2020 07/22/2018 07/02/2018  Nervous, Anxious, on Edge 3 3 1   Control/stop worrying 3 3 2   Worry too much - different things 3 3 2   Trouble relaxing 3 3 1   Restless 3 3 1   Easily annoyed or irritable 3 3 0  Afraid - awful might happen 1 1 0  Total GAD 7 Score 19 19 7   Anxiety Difficulty Very difficult Somewhat difficult Somewhat difficult    Previously on prescription medications for mood/anxiety: on wellbutrin; failed prozac. Had used celexa for years prior. Therapist/counseling:not now Previous Diagnosis of psychiatric disorder: chronic recurrent depression   Assessment  1. Major depression,  recurrent, chronic ([place])   2. Psychophysiological insomnia   3. Primary insomnia   4. Chronic neck pain due to DJD, DDD, unable to tolerate NSAIDs      Plan   Depression: Anxiety: Work-related and allergy related.  Continue Wellbutrin.  Work on Building surveyor strategies with follow-up if not proving  Insomnia: Increase trazodone dose from 50 mg to 100 mg nightly.  Add back gabapentin  Chronic back pain insomnia: Gabapentin 300 twice daily.  Restart.  Education given.  Reviewed concept of mood problems caused by biochemical imbalance of neurotransmitters and rationale for treatment with medications and therapy.    Counseling given: pt was instructed to contact office, on-call physician or crisis Hotline if symptoms worsen significantly. If patient develops any suicidal or homicidal thoughts, she is directed to the ER immediately.   Follow up: Return in about 6 weeks (around 04/02/2020) for mood follow up.  No orders of the defined types were placed in this encounter.  Meds ordered this encounter  Medications  . traZODone (DESYREL) 50 MG tablet    Sig: Take 1-2 tablets (50-100 mg total) by mouth at bedtime as needed for sleep.    Dispense:  180 tablet    Refill:  3  . busPIRone (BUSPAR) 7.5 MG tablet    Sig: Take 1 tablet (7.5 mg total) by mouth 3 (three) times daily as needed.    Dispense:  60 tablet    Refill:  2  . gabapentin (NEURONTIN) 300 MG capsule    Sig: Take 1 capsule (300 mg total) by mouth 2 (two) times daily.    Dispense:  180 capsule    Refill:  3      I reviewed the patients updated PMH, FH, and SocHx.    Patient Active Problem List   Diagnosis Date Noted  . Lumbar radiculopathy, chronic 07/26/2019    Priority: High  . Prediabetes 07/26/2019    Priority: High  . Insomnia 07/04/2018    Priority: High  . Osteoporosis 07/04/2018    Priority: High  . Major depression, recurrent, chronic ([place]) 07/04/2018    Priority: High  . OSA (obstructive sleep apnea), not on CPAP 07/04/2018    Priority: High  . Gallstones 07/26/2019    Priority: Medium  . S/P dilatation of esophageal stricture 07/23/2018    Priority: Medium  . Colon polyp 07/04/2018    Priority: Medium  . IBS (irritable bowel syndrome) 07/04/2018    Priority: Medium  . Chronic neck pain due to DJD, DDD, unable to tolerate NSAIDs 07/04/2018    Priority: Medium  . Allergic rhinitis 07/04/2018    Priority: Medium  . GERD (gastroesophageal reflux disease) 10/02/2008    Priority: Medium  . Seasonal allergic rhinitis due to pollen 07/26/2019    Priority: Low  . Vitamin D deficiency 07/04/2018    Priority: Low  .  Grief reaction with prolonged bereavement 01/26/2019  . Anxiety 07/23/2018   Current Meds  Medication Sig  . BIOTIN PO Take 3,000 mcg by mouth.  Marland Kitchen buPROPion (WELLBUTRIN) 75 MG tablet Take 1 tablet (75 mg total) by mouth 2 (two) times daily.  . busPIRone (BUSPAR) 7.5 MG tablet Take 1 tablet (7.5 mg total) by mouth 3 (three) times daily as needed.  . Calcium Citrate-Vitamin D (CALCIUM + D PO) Take by mouth.  . Cyanocobalamin (VITAMIN B12 PO) Take by mouth.  . dexlansoprazole (DEXILANT) 60 MG capsule Take 1 capsule (60 mg total) by mouth daily.  Marland Kitchen glucose blood test strip  Use as instructed  . Probiotic Product (DIGESTIVE ADVANTAGE GUMMIES PO) Take by mouth.  . triamcinolone (NASACORT) 55 MCG/ACT AERO nasal inhaler Place 2 sprays into the nose daily.  . valACYclovir (VALTREX) 1000 MG tablet TAKE 2 TABLETS BY MOUTH 2 (TWO) TIMES DAILY. ONCE, AS NEEDED FOR COLD SORES  . [DISCONTINUED] gabapentin (NEURONTIN) 300 MG capsule Take 1 capsule (300 mg total) by mouth at bedtime for 14 days, THEN 1 capsule (300 mg total) 2 (two) times daily.    Allergies: Patient is allergic to prozac [fluoxetine hcl], sulfonamide derivatives, adhesive  [tape], and sulfa antibiotics. Family history:  Patient family history includes Allergies in her brother; Colon polyps in her father; Diabetes in her mother; Diverticulitis in her father; Esophageal cancer in her maternal grandmother; Heart attack in her father; Heart failure in her father and mother; Kidney failure in her mother; Mitral valve prolapse in her mother; Stomach cancer in her paternal uncle. Social History   Socioeconomic History  . Marital status: Married    Spouse name: Coralyn Mark  . Number of children: 2  . Years of education: Not on file  . Highest education level: Not on file  Occupational History    Employer: RETIRED    Comment: Engineer  Tobacco Use  . Smoking status: Never Smoker  . Smokeless tobacco: Never Used  Vaping Use  . Vaping Use: Never  used  Substance and Sexual Activity  . Alcohol use: Yes    Alcohol/week: 0.0 standard drinks    Comment: 1 per month  . Drug use: No  . Sexual activity: Yes    Birth control/protection: Post-menopausal  Other Topics Concern  . Not on file  Social History Narrative  . Not on file   Social Determinants of Health   Financial Resource Strain: Not on file  Food Insecurity: Not on file  Transportation Needs: Not on file  Physical Activity: Not on file  Stress: Not on file  Social Connections: Not on file     Review of Systems: Constitutional: Negative for fever malaise or anorexia Cardiovascular: negative for chest pain Respiratory: negative for SOB or persistent cough Gastrointestinal: negative for abdominal pain  Objective  Vitals: There were no vitals taken for this visit. General: no acute distress, well appearing, no apparent distress, well groomed Psych:  Alert and oriented x 3,normal mood, behavior, speech, dress, and thought processes. Tearful, fair insight

## 2020-03-02 DIAGNOSIS — Z23 Encounter for immunization: Secondary | ICD-10-CM | POA: Diagnosis not present

## 2020-03-06 DIAGNOSIS — M65311 Trigger thumb, right thumb: Secondary | ICD-10-CM | POA: Diagnosis not present

## 2020-03-24 DIAGNOSIS — Z23 Encounter for immunization: Secondary | ICD-10-CM | POA: Diagnosis not present

## 2020-03-26 ENCOUNTER — Telehealth: Payer: Self-pay

## 2020-03-26 NOTE — Telephone Encounter (Signed)
Patient went to pharmacy to get her dexlansoprazole (DEXILANT) 60 MG capsule and states it was around 665 dollars and patient cant afford it can we send in the generic version of medication for her to [id number] - [place], [place] - [place]. Please

## 2020-03-26 NOTE — Telephone Encounter (Signed)
Pt sees Dr. Fuller Plan: do they have samples until her deductible is met? Or can they recommend which other generic they would prefer. Please discuss with his office.

## 2020-03-26 NOTE — Telephone Encounter (Signed)
Patient has a high deductible and her insurance is refusing to cover the generic regardless if PA or not.  Please advise on another medication that can be sent in. Patient has failed with zantac and prilosec in the past

## 2020-03-28 ENCOUNTER — Encounter: Payer: Self-pay | Admitting: Family Medicine

## 2020-03-28 ENCOUNTER — Other Ambulatory Visit: Payer: Self-pay | Admitting: Family Medicine

## 2020-03-28 ENCOUNTER — Other Ambulatory Visit: Payer: Self-pay

## 2020-03-28 ENCOUNTER — Ambulatory Visit (INDEPENDENT_AMBULATORY_CARE_PROVIDER_SITE_OTHER): Payer: Medicare Other | Admitting: Family Medicine

## 2020-03-28 VITALS — BP 132/86 | HR 94 | Temp 97.8°F | Resp 97 | Wt 183.4 lb

## 2020-03-28 DIAGNOSIS — Z23 Encounter for immunization: Secondary | ICD-10-CM

## 2020-03-28 DIAGNOSIS — F5104 Psychophysiologic insomnia: Secondary | ICD-10-CM | POA: Diagnosis not present

## 2020-03-28 DIAGNOSIS — N644 Mastodynia: Secondary | ICD-10-CM

## 2020-03-28 DIAGNOSIS — F339 Major depressive disorder, recurrent, unspecified: Secondary | ICD-10-CM | POA: Diagnosis not present

## 2020-03-28 DIAGNOSIS — R300 Dysuria: Secondary | ICD-10-CM | POA: Diagnosis not present

## 2020-03-28 DIAGNOSIS — N393 Stress incontinence (female) (male): Secondary | ICD-10-CM | POA: Insufficient documentation

## 2020-03-28 DIAGNOSIS — G8929 Other chronic pain: Secondary | ICD-10-CM

## 2020-03-28 DIAGNOSIS — M542 Cervicalgia: Secondary | ICD-10-CM | POA: Diagnosis not present

## 2020-03-28 DIAGNOSIS — N3946 Mixed incontinence: Secondary | ICD-10-CM | POA: Diagnosis not present

## 2020-03-28 LAB — URINALYSIS, ROUTINE W REFLEX MICROSCOPIC
Specific Gravity, Urine: 1.025 (ref 1.000–1.030)
pH: 6 (ref 5.0–8.0)

## 2020-03-28 MED ORDER — CIPROFLOXACIN HCL 500 MG PO TABS: 500.0000 mg | ORAL_TABLET | Freq: Two times a day (BID) | ORAL | 0 refills | Status: DC

## 2020-03-28 MED ORDER — OMEPRAZOLE 20 MG PO CPDR: 20.0000 mg | DELAYED_RELEASE_CAPSULE | Freq: Every day | ORAL | 1 refills | Status: DC

## 2020-03-28 NOTE — Patient Instructions (Addendum)
Please return in June 2022 for your annual complete physical; please come fasting.  I will let you know about your urine results today.   We will call you with information regarding your referral appointment. Urology. If you do not hear from [place] within the next 2 weeks, please let me know. It can take 1-2 weeks to get appointments set up with the specialists.   Today you were given your Prevnar vaccination. It protects you from certain types of pneumonias.  If you have any questions or concerns, please don't hesitate to send me a message via MyChart or call the office at [phone]. Thank you for visiting with [place] today! It's our pleasure caring for you.

## 2020-03-28 NOTE — Addendum Note (Signed)
Addended by: Billey Chang on: 03/28/2020 04:46 PM   Modules accepted: Orders

## 2020-03-28 NOTE — Telephone Encounter (Signed)
Patient had appt with PCP today and was prescribed new medication

## 2020-03-28 NOTE — Addendum Note (Signed)
Addended by: Jodell Cipro on: 03/28/2020 08:32 AM   Modules accepted: Orders

## 2020-03-28 NOTE — Progress Notes (Signed)
Subjective  CC:  Chief Complaint  Patient presents with  . Breast Pain    Right breast, hx breast implants - pain started a few weeks ago  . Urinary Incontinence    Accidents usually occur when she is up moving around, is experiencing some burning during urination also   . Depression    HPI: Taylor Hopkins is a 70 y.o. female who presents to the office today to address the problems listed above in the chief complaint, mood problems.  70 year old with history of breast implants complains of 1 to 2-week history of right lateral breast pain.  It is intermittent.  Near the area where her underwire bra pushes.  No injury.  No palpable masses.  No nipple discharge.  Reviewed normal mammogram from July 2021.  Depression/anxiety f/u: added buspar 6 weeks ago, continues on wellbutrin: She feels she is doing better.  See screens below.  Mood tends to remain variable.  She has good days and then bad days.  She feels BuSpar is helping.  Sleep is also better since the addition of gabapentin for her chronic neck pain.  Urinary symptoms: She has had 4 days of dysuria without gross hematuria.  Increasing urgency symptoms.  No fevers chills or flank pain.  Reports chronic stress incontinence symptoms.  Wears a pad daily.  Will also have intermittent urgency symptoms with leakage.  This is never been evaluated before.  GERD: Needs less expensive medication.   Depression screen [employer] 2/9 03/28/2020 02/20/2020 07/26/2019  Decreased Interest '1 2 1  ' Down, Depressed, Hopeless '1 2 1  ' PHQ - 2 Score '2 4 2  ' Altered sleeping '1 3 3  ' Tired, decreased energy 0 3 1  Change in appetite 0 3 1  Feeling bad or failure about yourself  '1 3 1  ' Trouble concentrating 0 3 1  Moving slowly or fidgety/restless 0 0 0  Suicidal thoughts 0 0 0  PHQ-9 Score '4 19 9  ' Difficult doing work/chores - Very difficult -   GAD 7 : Generalized Anxiety Score 03/28/2020 02/20/2020 07/22/2018 07/02/2018  Nervous, Anxious, on Edge 0 '3 3 1   ' Control/stop worrying '1 3 3 2  ' Worry too much - different things '1 3 3 2  ' Trouble relaxing '1 3 3 1  ' Restless 0 '3 3 1  ' Easily annoyed or irritable '1 3 3 ' 0  Afraid - awful might happen '1 1 1 ' 0  Total GAD 7 Score '5 19 19 7  ' Anxiety Difficulty - Very difficult Somewhat difficult Somewhat difficult    Assessment  1. Breast pain, right   2. Major depression, recurrent, chronic ([place])   3. Psychophysiological insomnia   4. Dysuria   5. Urinary incontinence, mixed   6. Chronic neck pain due to DJD, DDD, unable to tolerate NSAIDs      Plan    Right breast pain: Most consistent with musculoskeletal pain due to underwire bra.  She will change bras and see if it improves.  Dysuria: Patient on AZO: Send out for microscopy.  Treat if suggestive of infection.  Depression: Mood is improved.  Continue current medications  Chronic neck pain much improved on twice daily gabapentin.  Sleep is improved as well.  Continues on trazodone.  Chronic GERD symptoms: Well-controlled on Dexilant however cost $600 and she cannot afford this.  Change to omeprazole.  Can likely switch back once her deductible is met.  Updated Prevnar Follow up: June for complete physical Orders Placed This Encounter  Procedures  . Urine Culture  . Urinalysis, Routine w reflex microscopic  . Ambulatory referral to Urology   Meds ordered this encounter  Medications  . omeprazole (PRILOSEC) 20 MG capsule    Sig: Take 1 capsule (20 mg total) by mouth daily.    Dispense:  90 capsule    Refill:  1      I reviewed the patients updated PMH, FH, and SocHx.    Patient Active Problem List   Diagnosis Date Noted  . Lumbar radiculopathy, chronic 07/26/2019    Priority: High  . Prediabetes 07/26/2019    Priority: High  . Insomnia 07/04/2018    Priority: High  . Osteoporosis 07/04/2018    Priority: High  . Major depression, recurrent, chronic ([place]) 07/04/2018    Priority: High  . OSA (obstructive sleep apnea), not on  CPAP 07/04/2018    Priority: High  . Gallstones 07/26/2019    Priority: Medium  . S/P dilatation of esophageal stricture 07/23/2018    Priority: Medium  . Colon polyp 07/04/2018    Priority: Medium  . IBS (irritable bowel syndrome) 07/04/2018    Priority: Medium  . Chronic neck pain due to DJD, DDD, unable to tolerate NSAIDs 07/04/2018    Priority: Medium  . Allergic rhinitis 07/04/2018    Priority: Medium  . GERD (gastroesophageal reflux disease) 10/02/2008    Priority: Medium  . Seasonal allergic rhinitis due to pollen 07/26/2019    Priority: Low  . Vitamin D deficiency 07/04/2018    Priority: Low  . Urinary, incontinence, stress female 03/28/2020  . Urinary incontinence, mixed 03/28/2020  . Grief reaction with prolonged bereavement 01/26/2019  . Anxiety 07/23/2018   Current Meds  Medication Sig  . BIOTIN PO Take 3,000 mcg by mouth.  Marland Kitchen buPROPion (WELLBUTRIN) 75 MG tablet Take 1 tablet (75 mg total) by mouth 2 (two) times daily.  . busPIRone (BUSPAR) 7.5 MG tablet Take 1 tablet (7.5 mg total) by mouth 3 (three) times daily as needed.  . gabapentin (NEURONTIN) 300 MG capsule Take 1 capsule (300 mg total) by mouth 2 (two) times daily.  Marland Kitchen glucose blood test strip Use as instructed  . omeprazole (PRILOSEC) 20 MG capsule Take 1 capsule (20 mg total) by mouth daily.  . traZODone (DESYREL) 50 MG tablet Take 1-2 tablets (50-100 mg total) by mouth at bedtime as needed for sleep.  Marland Kitchen triamcinolone (NASACORT) 55 MCG/ACT AERO nasal inhaler Place 2 sprays into the nose daily.  . valACYclovir (VALTREX) 1000 MG tablet TAKE 2 TABLETS BY MOUTH 2 (TWO) TIMES DAILY. ONCE, AS NEEDED FOR COLD SORES  . [DISCONTINUED] dexlansoprazole (DEXILANT) 60 MG capsule Take 1 capsule (60 mg total) by mouth daily.    Allergies: Patient is allergic to prozac [fluoxetine hcl], sulfonamide derivatives, adhesive  [tape], and sulfa antibiotics. Family history:  Patient family history includes Allergies in her  brother; Colon polyps in her father; Diabetes in her mother; Diverticulitis in her father; Esophageal cancer in her maternal grandmother; Heart attack in her father; Heart failure in her father and mother; Kidney failure in her mother; Mitral valve prolapse in her mother; Stomach cancer in her paternal uncle. Social History   Socioeconomic History  . Marital status: Married    Spouse name: Coralyn Mark  . Number of children: 2  . Years of education: Not on file  . Highest education level: Not on file  Occupational History    Employer: RETIRED    Comment: Engineer  Tobacco Use  . Smoking  status: Never Smoker  . Smokeless tobacco: Never Used  Vaping Use  . Vaping Use: Never used  Substance and Sexual Activity  . Alcohol use: Yes    Alcohol/week: 0.0 standard drinks    Comment: 1 per month  . Drug use: No  . Sexual activity: Yes    Birth control/protection: Post-menopausal  Other Topics Concern  . Not on file  Social History Narrative  . Not on file   Social Determinants of Health   Financial Resource Strain: Not on file  Food Insecurity: Not on file  Transportation Needs: Not on file  Physical Activity: Not on file  Stress: Not on file  Social Connections: Not on file     Review of Systems: Constitutional: Negative for fever malaise or anorexia Cardiovascular: negative for chest pain Respiratory: negative for SOB or persistent cough Gastrointestinal: negative for abdominal pain  Objective  Vitals: BP 132/86   Pulse 94   Temp 97.8 F (36.6 C) (Temporal)   Resp (!) 97   Wt 183 lb 6.4 oz (83.2 kg)   BMI 29.60 kg/m  General: no acute distress, well appearing, no apparent distress, well groomed Psych:  Alert and oriented x 3,normal mood, behavior, speech, dress, and thought processes.  Bright affect today Breast: Bilateral implants palpable.  No masses.  No tenderness on exam.  Indentations present her underwire bra sits.   Commons side effects, risks, benefits, and  alternatives for medications and treatment plan prescribed today were discussed, and the patient expressed understanding of the given instructions. Patient is instructed to call or message via MyChart if he/she has any questions or concerns regarding our treatment plan. No barriers to understanding were identified. We discussed Red Flag symptoms and signs in detail. Patient expressed understanding regarding what to do in case of urgent or emergency type symptoms.   Medication list was reconciled, printed and provided to the patient in AVS. Patient instructions and summary information was reviewed with the patient as documented in the AVS. This note was prepared with assistance of Dragon voice recognition software. Occasional wrong-word or sound-a-like substitutions may have occurred due to the inherent limitations of voice recognition software

## 2020-03-29 MED ORDER — CEPHALEXIN 500 MG PO CAPS: 500.0000 mg | ORAL_CAPSULE | Freq: Two times a day (BID) | ORAL | 0 refills | Status: DC

## 2020-03-29 NOTE — Addendum Note (Signed)
Addended by: Billey Chang on: 03/29/2020 09:17 AM   Modules accepted: Orders

## 2020-03-29 NOTE — Telephone Encounter (Signed)
Please advise 

## 2020-03-30 LAB — URINE CULTURE
MICRO NUMBER:: 11486613
SPECIMEN QUALITY:: ADEQUATE

## 2020-04-01 NOTE — Progress Notes (Signed)
+   UTI sensitive to abx used. No further action needed

## 2020-05-15 ENCOUNTER — Telehealth: Payer: Self-pay | Admitting: Family Medicine

## 2020-05-15 NOTE — Telephone Encounter (Signed)
Left message for patient to call back and schedule Medicare Annual Wellness Visit (AWV) either virtually OR in office.   Last AWV 11/19/18; please schedule at anytime with [facility]Nurse Health Advisor at [facility].  This should be a 45 minute visit.

## 2020-05-30 ENCOUNTER — Other Ambulatory Visit: Payer: Self-pay | Admitting: Family Medicine

## 2020-08-20 ENCOUNTER — Telehealth: Payer: Self-pay

## 2020-08-20 NOTE — Chronic Care Management (AMB) (Signed)
  Chronic Care Management   Note  08/20/2020 Name: LIDWINA KANER MRN: [id number] DOB: Dec 07, 1950  KEMBERLY TAVES is a 70 y.o. year old female who is a primary care patient of Leamon Arnt, MD. I reached out to Jeremy Johann by phone today in response to a referral sent by Ms. [place]'s PCP, Leamon Arnt, MD      Ms. Carchi was given information about Chronic Care Management services today including:  CCM service includes personalized support from designated clinical staff supervised by her physician, including individualized plan of care and coordination with other care providers 24/7 contact phone numbers for assistance for urgent and routine care needs. Service will only be billed when office clinical staff spend 20 minutes or more in a month to coordinate care. Only one practitioner may furnish and bill the service in a calendar month. The patient may stop CCM services at any time (effective at the end of the month) by phone call to the office staff. The patient will be responsible for cost sharing (co-pay) of up to 20% of the service fee (after annual deductible is met).  Patient agreed to services and verbal consent obtained.   Follow up plan: Telephone appointment with care management team member scheduled for:09/06/2020  Noreene Larsson, [address], [place], [place] [address] Direct Dial: [phone] [email or web address] Website: [email or web address]

## 2020-08-20 NOTE — Chronic Care Management (AMB) (Signed)
  Chronic Care Management   Outreach Note  08/20/2020 Name: Taylor Hopkins MRN: [id number] DOB: 08/21/1950  Taylor Hopkins is a 70 y.o. year old female who is a primary care patient of Leamon Arnt, MD. I reached out to Jeremy Johann by phone today in response to a referral sent by Taylor Hopkins's PCP, Leamon Arnt, MD      An unsuccessful telephone outreach was attempted today. The patient was referred to the case management team for assistance with care management and care coordination.   Follow Up Plan: A HIPAA compliant phone message was left for the patient providing contact information and requesting a return call.  The care management team will reach out to the patient again over the next 7 days.  If patient returns call to provider office, please advise to call [place] at [place], [place], [place], [place] [address] Direct Dial: [phone] [email or web address]: [email or web address]

## 2020-08-30 ENCOUNTER — Other Ambulatory Visit: Payer: Self-pay | Admitting: Family Medicine

## 2020-09-06 ENCOUNTER — Ambulatory Visit: Payer: Medicare Other | Admitting: *Deleted

## 2020-09-06 DIAGNOSIS — M542 Cervicalgia: Secondary | ICD-10-CM

## 2020-09-06 DIAGNOSIS — G8929 Other chronic pain: Secondary | ICD-10-CM

## 2020-09-06 DIAGNOSIS — F339 Major depressive disorder, recurrent, unspecified: Secondary | ICD-10-CM

## 2020-09-06 NOTE — Patient Instructions (Signed)
Visit Information  Thank you for allowing me to share the care management and care coordination services that are available to you as part of your health plan and services through your primary care provider and medical home. Please reach out to me at [phone] if the care management/care coordination team may be of assistance to you in the future.   Corlette Ciano RN, MSN RN Care Management Coordinator  [facility] [phone] [email or web address]  

## 2020-09-06 NOTE — Chronic Care Management (AMB) (Signed)
  Care Management   Follow Up Note   09/06/2020 Name: Taylor Hopkins MRN: [id number] DOB: [birth year]   Referred by: Leamon Arnt, MD Reason for referral : Case Closure   Successful contact was made with the patient to discuss care management and care coordination services. Patient declines engagement at this time.   Follow Up Plan: The patient has been provided with contact information for the care management team and has been advised to call with any health related questions or concerns.   No further follow up needed as patient is declining services at this time.  Hubert Azure RN, MSN RN Care Management Coordinator  [place] [phone] [email or web address]

## 2020-10-02 ENCOUNTER — Other Ambulatory Visit: Payer: Self-pay | Admitting: Family Medicine

## 2020-10-02 DIAGNOSIS — F5101 Primary insomnia: Secondary | ICD-10-CM

## 2020-10-11 ENCOUNTER — Other Ambulatory Visit: Payer: Self-pay

## 2020-10-11 DIAGNOSIS — M8580 Other specified disorders of bone density and structure, unspecified site: Secondary | ICD-10-CM

## 2020-10-11 DIAGNOSIS — M810 Age-related osteoporosis without current pathological fracture: Secondary | ICD-10-CM

## 2020-10-12 DIAGNOSIS — Z780 Asymptomatic menopausal state: Secondary | ICD-10-CM | POA: Diagnosis not present

## 2020-10-12 DIAGNOSIS — Z1231 Encounter for screening mammogram for malignant neoplasm of breast: Secondary | ICD-10-CM | POA: Diagnosis not present

## 2020-10-12 DIAGNOSIS — M85852 Other specified disorders of bone density and structure, left thigh: Secondary | ICD-10-CM | POA: Diagnosis not present

## 2020-10-12 DIAGNOSIS — M85851 Other specified disorders of bone density and structure, right thigh: Secondary | ICD-10-CM | POA: Diagnosis not present

## 2020-10-12 LAB — HM MAMMOGRAPHY

## 2020-10-12 LAB — HM DEXA SCAN

## 2020-10-24 ENCOUNTER — Encounter: Payer: Self-pay | Admitting: Family Medicine

## 2020-10-25 ENCOUNTER — Encounter: Payer: Self-pay | Admitting: Family Medicine

## 2020-10-31 ENCOUNTER — Encounter: Payer: Self-pay | Admitting: Family Medicine

## 2020-11-02 NOTE — Telephone Encounter (Signed)
Please start PA for dexilant, failed omeprazole 20 and 40 and sucralfat

## 2020-11-02 NOTE — Telephone Encounter (Signed)
Patient is calling in a little frustrated that she hasnt had a response back about this. She states that her stomach has been bothering her so much and the Omeprazole hasnt helped, wondering if they can get a discount prescription for Dexilant or get the generic sent in.   Please advise.

## 2020-11-05 ENCOUNTER — Other Ambulatory Visit: Payer: Self-pay

## 2020-11-05 ENCOUNTER — Telehealth: Payer: Self-pay

## 2020-11-05 DIAGNOSIS — R1013 Epigastric pain: Secondary | ICD-10-CM

## 2020-11-05 MED ORDER — DEXLANSOPRAZOLE 60 MG PO CPDR: 60.0000 mg | DELAYED_RELEASE_CAPSULE | Freq: Every day | ORAL | 1 refills | Status: DC

## 2020-11-05 NOTE — Telephone Encounter (Signed)
Resolved issue with patient via [place]

## 2020-11-05 NOTE — Telephone Encounter (Signed)
Please return call regarding patients medication

## 2020-11-05 NOTE — Telephone Encounter (Signed)
Pt called with a few questions regarding medication. Taylor Hopkins would like a call back. Please Advise.

## 2020-11-05 NOTE — Telephone Encounter (Signed)
Spoke with patient regarding medication.

## 2020-11-06 DIAGNOSIS — M7062 Trochanteric bursitis, left hip: Secondary | ICD-10-CM | POA: Diagnosis not present

## 2020-11-06 DIAGNOSIS — M5106 Intervertebral disc disorders with myelopathy, lumbar region: Secondary | ICD-10-CM | POA: Diagnosis not present

## 2020-11-07 DIAGNOSIS — Z961 Presence of intraocular lens: Secondary | ICD-10-CM | POA: Diagnosis not present

## 2020-11-26 DIAGNOSIS — U071 COVID-19: Secondary | ICD-10-CM | POA: Diagnosis not present

## 2020-12-06 DIAGNOSIS — Z20822 Contact with and (suspected) exposure to covid-19: Secondary | ICD-10-CM | POA: Diagnosis not present

## 2020-12-10 ENCOUNTER — Telehealth: Payer: Self-pay

## 2020-12-10 NOTE — Telephone Encounter (Signed)
Patient approved for Prolia. No PA required, $0 dollars ready to schedule

## 2020-12-10 NOTE — Telephone Encounter (Signed)
LVM to call back and get scheduled.  

## 2020-12-18 ENCOUNTER — Encounter: Payer: Self-pay | Admitting: *Deleted

## 2020-12-18 ENCOUNTER — Ambulatory Visit
Admission: EM | Admit: 2020-12-18 | Discharge: 2020-12-18 | Disposition: A | Discharge status: Home / Self Care | Payer: Medicare Other | Attending: Physician Assistant | Admitting: Physician Assistant

## 2020-12-18 ENCOUNTER — Other Ambulatory Visit: Payer: Self-pay

## 2020-12-18 DIAGNOSIS — U071 COVID-19: Secondary | ICD-10-CM

## 2020-12-18 MED ORDER — NIRMATRELVIR/RITONAVIR (PAXLOVID)TABLET: 3.0000 | ORAL_TABLET | Freq: Two times a day (BID) | ORAL | 0 refills | Status: AC

## 2020-12-18 NOTE — ED Triage Notes (Signed)
Pt took temp this AM in car with 101 temp.

## 2020-12-18 NOTE — ED Provider Notes (Signed)
[facility]    CSN: [id number] Arrival date & time: 12/18/20  0920      History   Chief Complaint Chief Complaint  Patient presents with   Positive home test this AM   Cough   Fever   just feels bad    HPI Taylor Hopkins is a 70 y.o. female.   Patient here today for evaluation of positive COVID test today.  She reports that yesterday she started to have some body aches, today has developed low-grade fever and cough.  She does not report any treatment for symptoms.  She is interested in antiviral therapy.  The history is provided by the patient.  Cough Associated symptoms: fever, myalgias and sore throat   Associated symptoms: no chills, no ear pain, no eye discharge, no shortness of breath and no wheezing   Fever Associated symptoms: congestion, cough, myalgias and sore throat   Associated symptoms: no chills, no diarrhea, no ear pain, no nausea and no vomiting    Past Medical History:  Diagnosis Date   Adenomatous colon polyp 06/2003   Allergic rhinitis    Anxiety    Arthritis    Cataract 2010   bilateral eyes   Depression    Gallstones 07/26/2019   asymtomatic   GERD (gastroesophageal reflux disease)    Sleep apnea    Not currently using CPAP - last sleep study 2016    Patient Active Problem List   Diagnosis Date Noted   Urinary, incontinence, stress female 03/28/2020   Urinary incontinence, mixed 03/28/2020   Lumbar radiculopathy, chronic 07/26/2019   Seasonal allergic rhinitis due to pollen 07/26/2019   Prediabetes 07/26/2019   Gallstones 07/26/2019   Grief reaction with prolonged bereavement 01/26/2019   S/P dilatation of esophageal stricture 07/23/2018   Anxiety 07/23/2018   Colon polyp 07/04/2018   IBS (irritable bowel syndrome) 07/04/2018   Chronic neck pain due to DJD, DDD, unable to tolerate NSAIDs 07/04/2018   Vitamin D deficiency 07/04/2018   Insomnia 07/04/2018   Osteoporosis 07/04/2018   Allergic rhinitis 07/04/2018   Major  depression, recurrent, chronic ([place]) 07/04/2018   OSA (obstructive sleep apnea), not on CPAP 07/04/2018   GERD (gastroesophageal reflux disease) 10/02/2008    Past Surgical History:  Procedure Laterality Date   ANKLE SURGERY Right    BREAST ENHANCEMENT SURGERY     COLONOSCOPY     LUMBAR DISC SURGERY     TONSILLECTOMY     TOTAL ABDOMINAL HYSTERECTOMY     UPPER GASTROINTESTINAL ENDOSCOPY      OB History   No obstetric history on file.      Home Medications    Prior to Admission medications   Medication Sig Start Date End Date Taking? Authorizing Provider  nirmatrelvir/ritonavir EUA (PAXLOVID) 20 x 150 MG & 10 x 100MG  TABS Take 3 tablets by mouth 2 (two) times daily for 5 days. Take nirmatrelvir (150 mg) two tablets twice daily for 5 days and ritonavir (100 mg) one tablet twice daily for 5 days. 12/18/20 12/23/20 Yes Francene Finders, PA-C  BIOTIN PO Take 3,000 mcg by mouth.    [provider]  buPROPion (WELLBUTRIN) 75 MG tablet TAKE 1 TABLET BY MOUTH TWICE A DAY 05/30/20   Leamon Arnt, MD  busPIRone (BUSPAR) 7.5 MG tablet Take 1 tablet (7.5 mg total) by mouth 3 (three) times daily as needed. 02/20/20   Leamon Arnt, MD  Calcium Citrate-Vitamin D (CALCIUM + D PO) Take by mouth. Patient not  taking: Reported on 03/28/2020    [provider]  cephALEXin (KEFLEX) 500 MG capsule Take 1 capsule (500 mg total) by mouth 2 (two) times daily. 03/29/20   Leamon Arnt, MD  Cyanocobalamin (VITAMIN B12 PO) Take by mouth. Patient not taking: Reported on 03/28/2020    [provider]  dexlansoprazole (DEXILANT) 60 MG capsule Take 1 capsule (60 mg total) by mouth daily. 11/05/20   Leamon Arnt, MD  gabapentin (NEURONTIN) 300 MG capsule Take 1 capsule (300 mg total) by mouth 2 (two) times daily. 02/20/20   Leamon Arnt, MD  glucose blood test strip Use as instructed 12/20/18   Briscoe Deutscher, DO  omeprazole (PRILOSEC) 20 MG capsule TAKE 1 CAPSULE BY MOUTH EVERY DAY  08/30/20   Leamon Arnt, MD  traZODone (DESYREL) 50 MG tablet TAKE 0.5-1 TABLETS (25-50 MG TOTAL) BY MOUTH AT BEDTIME AS NEEDED FOR SLEEP. 10/02/20   Leamon Arnt, MD  triamcinolone (NASACORT) 55 MCG/ACT AERO nasal inhaler Place 2 sprays into the nose daily. 01/01/18   Wynona Luna, MD  valACYclovir (VALTREX) 1000 MG tablet TAKE 2 TABLETS BY MOUTH 2 (TWO) TIMES DAILY. ONCE, AS NEEDED FOR COLD SORES 06/14/19   Leamon Arnt, MD    Family History Family History  Problem Relation Age of Onset   Allergies Brother    Heart attack Father    Colon polyps Father    Diverticulitis Father    Heart failure Father    Esophageal cancer Maternal Grandmother    Heart failure Mother    Mitral valve prolapse Mother    Diabetes Mother    Kidney failure Mother    Stomach cancer Paternal Uncle    Colon cancer Neg Hx    Rectal cancer Neg Hx     Social History Social History   Tobacco Use   Smoking status: Never   Smokeless tobacco: Never  Vaping Use   Vaping Use: Never used  Substance Use Topics   Alcohol use: Yes    Alcohol/week: 0.0 standard drinks    Comment: 1 per month   Drug use: No     Allergies   Prozac [fluoxetine hcl], Sulfonamide derivatives, Adhesive  [tape], and Sulfa antibiotics   Review of Systems Review of Systems  Constitutional:  Positive for fever. Negative for chills.  HENT:  Positive for congestion, sinus pressure and sore throat. Negative for ear pain.   Eyes:  Negative for discharge and redness.  Respiratory:  Positive for cough. Negative for shortness of breath and wheezing.   Gastrointestinal:  Negative for abdominal pain, diarrhea, nausea and vomiting.  Musculoskeletal:  Positive for myalgias.    Physical Exam Triage Vital Signs ED Triage Vitals  Enc Vitals Group     BP 12/18/20 1157 (!) 147/85     Pulse Rate 12/18/20 1157 87     Resp 12/18/20 1157 18     Temp 12/18/20 1157 99.1 F (37.3 C)     Temp Source 12/18/20 1157 Oral     SpO2  12/18/20 1157 93 %     Weight --      Height --      Head Circumference --      Peak Flow --      Pain Score 12/18/20 1154 5     Pain Loc --      Pain Edu? --      Excl. in [place]? --    No data found.  Updated Vital Signs BP Marland Kitchen)  147/85   Pulse 87   Temp 99.1 F (37.3 C) (Oral)   Resp 18   SpO2 93%      Physical Exam Vitals and nursing note reviewed.  Constitutional:      General: She is not in acute distress.    Appearance: Normal appearance. She is not ill-appearing.  HENT:     Head: Normocephalic and atraumatic.     Nose: Congestion present.  Eyes:     Conjunctiva/sclera: Conjunctivae normal.  Cardiovascular:     Rate and Rhythm: Normal rate and regular rhythm.     Heart sounds: Normal heart sounds. No murmur heard. Pulmonary:     Effort: Pulmonary effort is normal. No respiratory distress.     Breath sounds: Normal breath sounds. No wheezing, rhonchi or rales.  Skin:    General: Skin is warm and dry.  Neurological:     Mental Status: She is alert.  Psychiatric:        Mood and Affect: Mood normal.        Thought Content: Thought content normal.     UC Treatments / Results  Labs (all labs ordered are listed, but only abnormal results are displayed) Labs Reviewed - No data to display  EKG   Radiology No results found.  Procedures Procedures (including critical care time)  Medications Ordered in UC Medications - No data to display  Initial Impression / Assessment and Plan / UC Course  I have reviewed the triage vital signs and the nursing notes.  Pertinent labs & imaging results that were available during my care of the patient were reviewed by me and considered in my medical decision making (see chart for details).  Antiviral treatment sent to pharmacy as requested.  Recommended symptomatic treatment otherwise.  Encouraged increase fluids and rest.  Discussed symptoms warranting emergency room evaluation including worsening shortness of breath,  uncontrollable fever, or chest pain.  Final Clinical Impressions(s) / UC Diagnoses   Final diagnoses:  COVID   Discharge Instructions   None    ED Prescriptions     Medication Sig Dispense Auth. Provider   nirmatrelvir/ritonavir EUA (PAXLOVID) 20 x 150 MG & 10 x 100MG  TABS Take 3 tablets by mouth 2 (two) times daily for 5 days. Take nirmatrelvir (150 mg) two tablets twice daily for 5 days and ritonavir (100 mg) one tablet twice daily for 5 days. 30 tablet Francene Finders, PA-C      PDMP not reviewed this encounter.   Francene Finders, PA-C 12/18/20 1233

## 2020-12-18 NOTE — ED Notes (Signed)
Pt called on mobile with no answer. Pt called from front lobby x2 with no answer.

## 2020-12-23 DIAGNOSIS — U071 COVID-19: Secondary | ICD-10-CM | POA: Diagnosis not present

## 2021-01-31 DIAGNOSIS — J019 Acute sinusitis, unspecified: Secondary | ICD-10-CM | POA: Diagnosis not present

## 2021-01-31 DIAGNOSIS — U099 Post covid-19 condition, unspecified: Secondary | ICD-10-CM | POA: Diagnosis not present

## 2021-02-19 ENCOUNTER — Telehealth: Payer: Self-pay

## 2021-02-19 ENCOUNTER — Other Ambulatory Visit: Payer: Self-pay

## 2021-02-19 MED ORDER — VALACYCLOVIR HCL 1 G PO TABS: ORAL_TABLET | ORAL | 2 refills | Status: DC

## 2021-02-19 NOTE — Telephone Encounter (Signed)
I have left patient vm to call back to schedule appt.   Patient Name: Taylor Hopkins Gender: Female DOB: [birth year] Age: 70 Y 19 M 15 D Return Phone Number: [phone] (Primary) Address: City/ State/ Zip: [place]  [address] Client [place] [facility] [place] Client Site [place] [facility] [place] [facility] Provider Billey Chang- MD Contact Type Call Who Is Calling Patient / Member / Family / Caregiver Call Type Triage / Clinical Relationship To Patient Self Return Phone Number [phone] (Primary) Chief Complaint Cold Sores Reason for Call Symptomatic / Request for Health Information Initial Comment caller has a cold sore on her lip and asking can medication be sent to pharmacy so she can pick it up today/ caller states she cant remember the name of the medication prescribe before Translation No Disp. Time Eilene Ghazi Time) Disposition Final User 02/16/2021 9:50:20 AM Attempt made - no message left Cazares, RN, Domenick Gong 02/16/2021 10:00:12 AM FINAL ATTEMPT MADE - no message left Yes Cazares, RN, Sanjuanita Janie PreDisposition InappropriateToAsk

## 2021-02-19 NOTE — Telephone Encounter (Signed)
fyi

## 2021-02-19 NOTE — Telephone Encounter (Signed)
Refilled Valtrex for patient also

## 2021-02-23 DIAGNOSIS — Z20822 Contact with and (suspected) exposure to covid-19: Secondary | ICD-10-CM | POA: Diagnosis not present

## 2021-03-18 ENCOUNTER — Telehealth: Payer: Self-pay | Admitting: Family Medicine

## 2021-03-18 NOTE — Telephone Encounter (Signed)
Spoke with patient, she was driving she stated she will CB to schedule AWV.    Please schedule Annual Wellness Visit.  Please schedule with Nurse Health Advisor Charlott Rakes, RN at [facility].  Please call [phone] ask for [facility]

## 2021-03-28 ENCOUNTER — Other Ambulatory Visit: Payer: Self-pay

## 2021-03-28 ENCOUNTER — Ambulatory Visit (INDEPENDENT_AMBULATORY_CARE_PROVIDER_SITE_OTHER): Payer: Medicare Other

## 2021-03-28 DIAGNOSIS — Z0000 Encounter for general adult medical examination without abnormal findings: Secondary | ICD-10-CM

## 2021-03-28 NOTE — Patient Instructions (Signed)
Taylor Hopkins , Thank you for taking time to come for your Medicare Wellness Visit. I appreciate your ongoing commitment to your health goals. Please review the following plan we discussed and let me know if I can assist you in the future.   Screening recommendations/referrals: Colonoscopy: Done 07/16/16 repeat every 5 years  Mammogram: Done 10/12/20 repeat every year  Bone Density: Done 10/12/20 repeat every 2 years  Recommended yearly ophthalmology/optometry visit for glaucoma screening and checkup Recommended yearly dental visit for hygiene and checkup  Vaccinations: Influenza vaccine: Due and discussed  Pneumococcal vaccine: Up to date Tdap vaccine: Due and discussed  Shingles vaccine: Shingrix discussed. Please contact your pharmacy for coverage information.    Covid-19:Completed 4/9, 06/24/19 & 03/24/20  Advanced directives: Please bring a copy of your health care power of attorney and living will to the office at your convenience.  Conditions/risks identified: lose weight   Next appointment: Follow up in one year for your annual wellness visit    Preventive Care 65 Years and Older, Female Preventive care refers to lifestyle choices and visits with your health care provider that can promote health and wellness. What does preventive care include? A yearly physical exam. This is also called an annual well check. Dental exams once or twice a year. Routine eye exams. Ask your health care provider how often you should have your eyes checked. Personal lifestyle choices, including: Daily care of your teeth and gums. Regular physical activity. Eating a healthy diet. Avoiding tobacco and drug use. Limiting alcohol use. Practicing safe sex. Taking low-dose aspirin every day. Taking vitamin and mineral supplements as recommended by your health care provider. What happens during an annual well check? The services and screenings done by your health care provider during your annual well  check will depend on your age, overall health, lifestyle risk factors, and family history of disease. Counseling  Your health care provider may ask you questions about your: Alcohol use. Tobacco use. Drug use. Emotional well-being. Home and relationship well-being. Sexual activity. Eating habits. History of falls. Memory and ability to understand (cognition). Work and work Statistician. Reproductive health. Screening  You may have the following tests or measurements: Height, weight, and BMI. Blood pressure. Lipid and cholesterol levels. These may be checked every 5 years, or more frequently if you are over 6 years old. Skin check. Lung cancer screening. You may have this screening every year starting at age 34 if you have a 30-pack-year history of smoking and currently smoke or have quit within the past 15 years. Fecal occult blood test (FOBT) of the stool. You may have this test every year starting at age 60. Flexible sigmoidoscopy or colonoscopy. You may have a sigmoidoscopy every 5 years or a colonoscopy every 10 years starting at age 4. Hepatitis C blood test. Hepatitis B blood test. Sexually transmitted disease (STD) testing. Diabetes screening. This is done by checking your blood sugar (glucose) after you have not eaten for a while (fasting). You may have this done every 1-3 years. Bone density scan. This is done to screen for osteoporosis. You may have this done starting at age 96. Mammogram. This may be done every 1-2 years. Talk to your health care provider about how often you should have regular mammograms. Talk with your health care provider about your test results, treatment options, and if necessary, the need for more tests. Vaccines  Your health care provider may recommend certain vaccines, such as: Influenza vaccine. This is recommended every year. Tetanus, diphtheria,  and acellular pertussis (Tdap, Td) vaccine. You may need a Td booster every 10 years. Zoster  vaccine. You may need this after age 65. Pneumococcal 13-valent conjugate (PCV13) vaccine. One dose is recommended after age 49. Pneumococcal polysaccharide (PPSV23) vaccine. One dose is recommended after age 52. Talk to your health care provider about which screenings and vaccines you need and how often you need them. This information is not intended to replace advice given to you by your health care provider. Make sure you discuss any questions you have with your health care provider. Document Released: 03/09/2015 Document Revised: 10/31/2015 Document Reviewed: 12/12/2014 Elsevier Interactive Patient Education  2017 [place] Prevention in the Home Falls can cause injuries. They can happen to people of all ages. There are many things you can do to make your home safe and to help prevent falls. What can I do on the outside of my home? Regularly fix the edges of walkways and driveways and fix any cracks. Remove anything that might make you trip as you walk through a door, such as a raised step or threshold. Trim any bushes or trees on the path to your home. Use bright outdoor lighting. Clear any walking paths of anything that might make someone trip, such as rocks or tools. Regularly check to see if handrails are loose or broken. Make sure that both sides of any steps have handrails. Any raised decks and porches should have guardrails on the edges. Have any leaves, snow, or ice cleared regularly. Use sand or salt on walking paths during winter. Clean up any spills in your garage right away. This includes oil or grease spills. What can I do in the bathroom? Use night lights. Install grab bars by the toilet and in the tub and shower. Do not use towel bars as grab bars. Use non-skid mats or decals in the tub or shower. If you need to sit down in the shower, use a plastic, non-slip stool. Keep the floor dry. Clean up any water that spills on the floor as soon as it happens. Remove  soap buildup in the tub or shower regularly. Attach bath mats securely with double-sided non-slip rug tape. Do not have throw rugs and other things on the floor that can make you trip. What can I do in the bedroom? Use night lights. Make sure that you have a light by your bed that is easy to reach. Do not use any sheets or blankets that are too big for your bed. They should not hang down onto the floor. Have a firm chair that has side arms. You can use this for support while you get dressed. Do not have throw rugs and other things on the floor that can make you trip. What can I do in the kitchen? Clean up any spills right away. Avoid walking on wet floors. Keep items that you use a lot in easy-to-reach places. If you need to reach something above you, use a strong step stool that has a grab bar. Keep electrical cords out of the way. Do not use floor polish or wax that makes floors slippery. If you must use wax, use non-skid floor wax. Do not have throw rugs and other things on the floor that can make you trip. What can I do with my stairs? Do not leave any items on the stairs. Make sure that there are handrails on both sides of the stairs and use them. Fix handrails that are broken or loose. Make sure  that handrails are as long as the stairways. Check any carpeting to make sure that it is firmly attached to the stairs. Fix any carpet that is loose or worn. Avoid having throw rugs at the top or bottom of the stairs. If you do have throw rugs, attach them to the floor with carpet tape. Make sure that you have a light switch at the top of the stairs and the bottom of the stairs. If you do not have them, ask someone to add them for you. What else can I do to help prevent falls? Wear shoes that: Do not have high heels. Have rubber bottoms. Are comfortable and fit you well. Are closed at the toe. Do not wear sandals. If you use a stepladder: Make sure that it is fully opened. Do not climb a  closed stepladder. Make sure that both sides of the stepladder are locked into place. Ask someone to hold it for you, if possible. Clearly mark and make sure that you can see: Any grab bars or handrails. First and last steps. Where the edge of each step is. Use tools that help you move around (mobility aids) if they are needed. These include: Canes. Walkers. Scooters. Crutches. Turn on the lights when you go into a dark area. Replace any light bulbs as soon as they burn out. Set up your furniture so you have a clear path. Avoid moving your furniture around. If any of your floors are uneven, fix them. If there are any pets around you, be aware of where they are. Review your medicines with your doctor. Some medicines can make you feel dizzy. This can increase your chance of falling. Ask your doctor what other things that you can do to help prevent falls. This information is not intended to replace advice given to you by your health care provider. Make sure you discuss any questions you have with your health care provider. Document Released: 12/07/2008 Document Revised: 07/19/2015 Document Reviewed: 03/17/2014 Elsevier Interactive Patient Education  2017 [employer].

## 2021-03-28 NOTE — Progress Notes (Signed)
Virtual Visit via Telephone Note  I connected with  [name] on 03/28/21 at  8:45 AM EST by telephone and verified that I am speaking with the correct person using two identifiers.  Medicare Annual Wellness visit completed telephonically due to Covid-19 pandemic.   Persons participating in this call: This Health Coach and this patient.   Location: Patient: Home  Provider: Office    I discussed the limitations, risks, security and privacy concerns of performing an evaluation and management service by telephone and the availability of in person appointments. The patient expressed understanding and agreed to proceed.  Unable to perform video visit due to video visit attempted and failed and/or patient does not have video capability.   Some vital signs may be absent or patient reported.   Taylor Brace, LPN   Subjective:   Taylor Hopkins is a 71 y.o. female who presents for an Initial Medicare Annual Wellness Visit.  Review of Systems     Cardiac Risk Factors include: advanced age (>75men, >66 women)     Objective:    Today's Vitals   03/28/21 0846  PainSc: 6    There is no height or weight on file to calculate BMI.  Advanced Directives 03/28/2021 01/31/2019 11/19/2018 10/18/2018 07/16/2016  Does Patient Have a Medical Advance Directive? Yes Yes Yes Yes Yes  Type of Arts administrator Power of [place]  Does patient want to make changes to medical advance directive? - No - Patient declined No - Patient declined No - Patient declined -  Copy of [place] in Chart? No - copy requested Yes - validated most recent copy scanned in chart (See row information) No - copy requested - No - copy requested    Current Medications (verified) Outpatient Encounter Medications as of 03/28/2021  Medication Sig   BIOTIN PO Take 3,000 mcg by  mouth.   buPROPion (WELLBUTRIN) 75 MG tablet TAKE 1 TABLET BY MOUTH TWICE A DAY   busPIRone (BUSPAR) 7.5 MG tablet Take 1 tablet (7.5 mg total) by mouth 3 (three) times daily as needed.   Calcium Citrate-Vitamin D (CALCIUM + D PO) Take by mouth.   Cyanocobalamin (VITAMIN B12 PO) Take by mouth.   gabapentin (NEURONTIN) 300 MG capsule Take 1 capsule (300 mg total) by mouth 2 (two) times daily.   glucose blood test strip Use as instructed   traZODone (DESYREL) 50 MG tablet TAKE 0.5-1 TABLETS (25-50 MG TOTAL) BY MOUTH AT BEDTIME AS NEEDED FOR SLEEP.   triamcinolone (NASACORT) 55 MCG/ACT AERO nasal inhaler Place 2 sprays into the nose daily.   valACYclovir (VALTREX) 1000 MG tablet TAKE 2 TABLETS BY MOUTH 2 (TWO) TIMES DAILY. ONCE, AS NEEDED FOR COLD SORES   dexlansoprazole (DEXILANT) 60 MG capsule Take 1 capsule (60 mg total) by mouth daily. (Patient not taking: Reported on 03/28/2021)   omeprazole (PRILOSEC) 20 MG capsule TAKE 1 CAPSULE BY MOUTH EVERY DAY (Patient not taking: Reported on 03/28/2021)   [DISCONTINUED] cephALEXin (KEFLEX) 500 MG capsule Take 1 capsule (500 mg total) by mouth 2 (two) times daily.   No facility-administered encounter medications on file as of 03/28/2021.    Allergies (verified) Prozac [fluoxetine hcl], Sulfonamide derivatives, Adhesive  [tape], and Sulfa antibiotics   History: Past Medical History:  Diagnosis Date   Adenomatous colon polyp 06/2003   Allergic rhinitis    Anxiety    Arthritis  Cataract 2010   bilateral eyes   Depression    Gallstones 07/26/2019   asymtomatic   GERD (gastroesophageal reflux disease)    Sleep apnea    Not currently using CPAP - last sleep study 2016   Past Surgical History:  Procedure Laterality Date   ANKLE SURGERY Right    BREAST ENHANCEMENT SURGERY     COLONOSCOPY     LUMBAR DISC SURGERY     TONSILLECTOMY     TOTAL ABDOMINAL HYSTERECTOMY     UPPER GASTROINTESTINAL ENDOSCOPY     Family History  Problem Relation Age of  Onset   Allergies Brother    Heart attack Father    Colon polyps Father    Diverticulitis Father    Heart failure Father    Esophageal cancer Maternal Grandmother    Heart failure Mother    Mitral valve prolapse Mother    Diabetes Mother    Kidney failure Mother    Stomach cancer Paternal Uncle    Colon cancer Neg Hx    Rectal cancer Neg Hx    Social History   Socioeconomic History   Marital status: Married    Spouse name: Coralyn Mark   Number of children: 2   Years of education: Not on file   Highest education level: Not on file  Occupational History    Employer: RETIRED    Comment: Engineer  Tobacco Use   Smoking status: Never   Smokeless tobacco: Never  Vaping Use   Vaping Use: Never used  Substance and Sexual Activity   Alcohol use: Yes    Alcohol/week: 0.0 standard drinks    Comment: 1 per month   Drug use: No   Sexual activity: Yes    Birth control/protection: Post-menopausal  Other Topics Concern   Not on file  Social History Narrative   Not on file   Social Determinants of Health   Financial Resource Strain: Low Risk    Difficulty of Paying Living Expenses: Not hard at all  Food Insecurity: No Food Insecurity   Worried About Charity fundraiser in the Last Year: Never true   [place] in the Last Year: Never true  Transportation Needs: No Transportation Needs   Lack of Transportation (Medical): No   Lack of Transportation (Non-Medical): No  Physical Activity: Inactive   Days of Exercise per Week: 0 days   Minutes of Exercise per Session: 0 min  Stress: Stress Concern Present   Feeling of Stress : To some extent  Social Connections: Moderately Integrated   Frequency of Communication with Friends and Family: More than three times a week   Frequency of Social Gatherings with Friends and Family: More than three times a week   Attends Religious Services: More than 4 times per year   Active Member of [employer] or Organizations: No   Attends Theatre manager Meetings: Never   Marital Status: Married    Tobacco Counseling Counseling given: Not Answered   Clinical Intake:  Pre-visit preparation completed: Yes  Pain : 0-10 Pain Score: 6  Pain Type: Chronic pain Pain Location: Generalized Pain Descriptors / Indicators: Throbbing, Burning Pain Onset: More than a month ago Pain Frequency: Constant     BMI - recorded: 29.6 Nutritional Status: BMI 25 -29 Overweight Nutritional Risks: None Diabetes: No  How often do you need to have someone help you when you read instructions, pamphlets, or other written materials from your doctor or pharmacy?: 1 - Never  Diabetic?No  Interpreter Needed?: No  Information entered by :: Charlott Rakes, LPN   Activities of Daily Living In your present state of health, do you have any difficulty performing the following activities: 03/28/2021  Hearing? Y  Comment slight loss  Vision? N  Difficulty concentrating or making decisions? N  Walking or climbing stairs? Y  Dressing or bathing? N  Doing errands, shopping? N  Preparing Food and eating ? N  Using the Toilet? N  In the past six months, have you accidently leaked urine? Y  Comment wears a pad  Do you have problems with loss of bowel control? N  Managing your Medications? N  Managing your Finances? N  Housekeeping or managing your Housekeeping? N  Some recent data might be hidden    Patient Care Team: Leamon Arnt, MD as PCP - General (Family Medicine) Lady Gary, [facility] as Consulting Physician (Gynecology) Berle Mull, MD as Consulting Physician (Sports Medicine) Ladene Artist, MD as Consulting Physician (Gastroenterology) Magnus Sinning, MD as Consulting Physician (Physical Medicine and Rehabilitation)  Indicate any recent Medical Services you may have received from other than [facility] providers in the past year (date may be approximate).     Assessment:   This is a routine wellness examination  for [place].  Hearing/Vision screen Hearing Screening - Comments:: Pt stated slight loss of hearing  Vision Screening - Comments:: Pt follows up with [facility] for annual eye exams   Dietary issues and exercise activities discussed: Current Exercise Habits: The patient does not participate in regular exercise at present   Goals Addressed             This Visit's Progress    Patient Stated       Lose weight        Depression Screen PHQ 2/9 Scores 03/28/2021 03/28/2020 02/20/2020 07/26/2019 03/28/2019 02/23/2019 11/19/2018  PHQ - 2 Score 2 2 4 2 4  0 0  PHQ- 9 Score 5 4 19 9 14 1 6     Fall Risk Fall Risk  03/28/2021 07/26/2019 11/19/2018 07/22/2018  Falls in the past year? 0 0 0 0  Number falls in past yr: 0 0 0 0  Injury with Fall? 0 0 0 0  Risk for fall due to : Impaired vision - - -  Follow up Falls prevention discussed - Education provided;Falls prevention discussed;Falls evaluation completed -    FALL RISK PREVENTION PERTAINING TO THE HOME:  Any stairs in or around the home? Yes  If so, are there any without handrails? No  Home free of loose throw rugs in walkways, pet beds, electrical cords, etc? Yes  Adequate lighting in your home to reduce risk of falls? Yes   ASSISTIVE DEVICES UTILIZED TO PREVENT FALLS:  Life alert? No  Use of a cane, walker or w/c? No  Grab bars in the bathroom? No  Shower chair or bench in shower? No  Elevated toilet seat or a handicapped toilet? No   TIMED UP AND GO:  Was the test performed? No   Cognitive Function:     6CIT Screen 03/28/2021  What Year? 0 points  What month? 0 points  What time? 0 points  Count back from 20 0 points  Months in reverse 0 points  Repeat phrase 0 points  Total Score 0    Immunizations Immunization History  Administered Date(s) Administered   Fluad Quad(high Dose 65+) 11/19/2018   PFIZER(Purple Top)SARS-COV-2 Vaccination 06/03/2019, 06/24/2019, 03/24/2020   Pneumococcal Conjugate-13 03/28/2020  Pneumococcal Polysaccharide-23 11/19/2018    TDAP status: Due, Education has been provided regarding the importance of this vaccine. Advised may receive this vaccine at local pharmacy or Health Dept. Aware to provide a copy of the vaccination record if obtained from local pharmacy or Health Dept. Verbalized acceptance and understanding.  Flu Vaccine status: Due, Education has been provided regarding the importance of this vaccine. Advised may receive this vaccine at local pharmacy or Health Dept. Aware to provide a copy of the vaccination record if obtained from local pharmacy or Health Dept. Verbalized acceptance and understanding.  Pneumococcal vaccine status: Up to date  Covid-19 vaccine status: Completed vaccines  Qualifies for Shingles Vaccine? Yes   Zostavax completed No   Shingrix Completed?: No.    Education has been provided regarding the importance of this vaccine. Patient has been advised to call insurance company to determine out of pocket expense if they have not yet received this vaccine. Advised may also receive vaccine at local pharmacy or Health Dept. Verbalized acceptance and understanding.  Screening Tests Health Maintenance  Topic Date Due   TETANUS/TDAP  Never done   Zoster Vaccines- Shingrix (1 of 2) Never done   COVID-19 Vaccine (4 - Booster for Pfizer series) 05/19/2020   INFLUENZA VACCINE  09/24/2020   COLONOSCOPY (Pts 45-16yrs Insurance coverage will need to be confirmed)  07/16/2021   MAMMOGRAM  10/12/2021   DEXA SCAN  10/13/2022   Pneumonia Vaccine 5+ Years old  Completed   Hepatitis C Screening  Completed   HPV VACCINES  Aged Out    Health Maintenance  Health Maintenance Due  Topic Date Due   TETANUS/TDAP  Never done   Zoster Vaccines- Shingrix (1 of 2) Never done   COVID-19 Vaccine (4 - Booster for Pfizer series) 05/19/2020   INFLUENZA VACCINE  09/24/2020    Colorectal cancer screening: Type of screening: Colonoscopy. Completed 07/16/16. Repeat  every 5 years  Mammogram status: Completed 10/12/20. Repeat every year  Bone Density status: Completed 10/12/20. Results reflect: Bone density results: OSTEOPOROSIS. Repeat every 2 years.   Additional Screening:  Hepatitis C Screening:  Completed 07/05/18  Vision Screening: Recommended annual ophthalmology exams for early detection of glaucoma and other disorders of the eye. Is the patient up to date with their annual eye exam?  Yes  Who is the provider or what is the name of the office in which the patient attends annual eye exams? [facility] If pt is not established with a provider, would they like to be referred to a provider to establish care? No .   Dental Screening: Recommended annual dental exams for proper oral hygiene  Community Resource Referral / Chronic Care Management: CRR required this visit?  No   CCM required this visit?  No      Plan:     I have personally reviewed and noted the following in the patients chart:   Medical and social history Use of alcohol, tobacco or illicit drugs  Current medications and supplements including opioid prescriptions. Patient is not currently taking opioid prescriptions. Functional ability and status Nutritional status Physical activity Advanced directives List of other physicians Hospitalizations, surgeries, and ER visits in previous 12 months Vitals Screenings to include cognitive, depression, and falls Referrals and appointments  In addition, I have reviewed and discussed with patient certain preventive protocols, quality metrics, and best practice recommendations. A written personalized care plan for preventive services as well as general preventive health recommendations were provided to patient.  Taylor Brace, LPN   03/27/310   Nurse Notes: None

## 2021-06-07 ENCOUNTER — Telehealth: Payer: Self-pay

## 2021-06-07 NOTE — Telephone Encounter (Signed)
Restart ?Last inj 2020 ?Appt with PCP 07/04/21 ?

## 2021-06-10 DIAGNOSIS — Z20822 Contact with and (suspected) exposure to covid-19: Secondary | ICD-10-CM | POA: Diagnosis not present

## 2021-06-11 NOTE — Telephone Encounter (Signed)
Prolia VOB initiated via [email or web address]  Last OV:  Next OV:  Last Prolia inj:  Next Prolia inj DUE:   

## 2021-06-13 NOTE — Telephone Encounter (Signed)
Pt ready for scheduling on or after 06/13/21 ? ?Out-of-pocket cost due at time of visit: $226 (deductible) ? ?Primary: Medicare ?Prolia co-insurance: 20% (approximately $276) ?Admin fee co-insurance: 20% (approximately $25) ? ?Secondary: AARP Medicare Supp ?Prolia co-insurance: t covers the Medicare Part B co-insurance and 100% of the excess charges ?Admin fee co-insurance: t covers the Medicare Part B co-insurance and 100% of the excess charges ? ?Deductible: $0 of $226 met as of 06/11/21 (NOT covered by secondary) ? ?Prior Auth: not required ?PA# ?Valid:  ? ?** This summary of benefits is an estimation of the patient's out-of-pocket cost. Exact cost may vary based on individual plan coverage.  ? ?

## 2021-07-04 ENCOUNTER — Encounter: Payer: Self-pay | Admitting: Family Medicine

## 2021-07-04 ENCOUNTER — Ambulatory Visit: Payer: Medicare Other | Admitting: *Deleted

## 2021-07-04 ENCOUNTER — Ambulatory Visit (INDEPENDENT_AMBULATORY_CARE_PROVIDER_SITE_OTHER): Payer: Medicare Other | Admitting: Family Medicine

## 2021-07-04 VITALS — BP 136/80 | HR 75 | Temp 98.1°F | Ht 66.0 in | Wt 182.2 lb

## 2021-07-04 DIAGNOSIS — J301 Allergic rhinitis due to pollen: Secondary | ICD-10-CM

## 2021-07-04 DIAGNOSIS — E663 Overweight: Secondary | ICD-10-CM | POA: Diagnosis not present

## 2021-07-04 DIAGNOSIS — F339 Major depressive disorder, recurrent, unspecified: Secondary | ICD-10-CM | POA: Diagnosis not present

## 2021-07-04 DIAGNOSIS — E538 Deficiency of other specified B group vitamins: Secondary | ICD-10-CM

## 2021-07-04 DIAGNOSIS — F5104 Psychophysiologic insomnia: Secondary | ICD-10-CM

## 2021-07-04 DIAGNOSIS — R7303 Prediabetes: Secondary | ICD-10-CM

## 2021-07-04 DIAGNOSIS — M810 Age-related osteoporosis without current pathological fracture: Secondary | ICD-10-CM

## 2021-07-04 DIAGNOSIS — N3946 Mixed incontinence: Secondary | ICD-10-CM | POA: Diagnosis not present

## 2021-07-04 DIAGNOSIS — D126 Benign neoplasm of colon, unspecified: Secondary | ICD-10-CM | POA: Diagnosis not present

## 2021-07-04 DIAGNOSIS — E559 Vitamin D deficiency, unspecified: Secondary | ICD-10-CM

## 2021-07-04 DIAGNOSIS — M8580 Other specified disorders of bone density and structure, unspecified site: Secondary | ICD-10-CM | POA: Diagnosis not present

## 2021-07-04 LAB — CBC WITH DIFFERENTIAL/PLATELET
Basophils Absolute: 0 10*3/uL (ref 0.0–0.1)
Basophils Relative: 0.6 % (ref 0.0–3.0)
Eosinophils Absolute: 0.1 10*3/uL (ref 0.0–0.7)
Eosinophils Relative: 1.3 % (ref 0.0–5.0)
HCT: 39.8 % (ref 36.0–46.0)
Hemoglobin: 13.6 g/dL (ref 12.0–15.0)
Lymphocytes Relative: 30.6 % (ref 12.0–46.0)
Lymphs Abs: 1.5 10*3/uL (ref 0.7–4.0)
MCHC: 34.1 g/dL (ref 30.0–36.0)
MCV: 96.8 fl (ref 78.0–100.0)
Monocytes Absolute: 0.5 10*3/uL (ref 0.1–1.0)
Monocytes Relative: 9.7 % (ref 3.0–12.0)
Neutro Abs: 2.8 10*3/uL (ref 1.4–7.7)
Neutrophils Relative %: 57.8 % (ref 43.0–77.0)
Platelets: 149 10*3/uL — ABNORMAL LOW (ref 150.0–400.0)
RBC: 4.12 Mil/uL (ref 3.87–5.11)
RDW: 12.8 % (ref 11.5–15.5)
WBC: 4.9 10*3/uL (ref 4.0–10.5)

## 2021-07-04 LAB — COMPREHENSIVE METABOLIC PANEL
ALT: 13 U/L (ref 0–35)
AST: 18 U/L (ref 0–37)
Albumin: 4.1 g/dL (ref 3.5–5.2)
Alkaline Phosphatase: 53 U/L (ref 39–117)
BUN: 11 mg/dL (ref 6–23)
CO2: 29 mEq/L (ref 19–32)
Calcium: 8.8 mg/dL (ref 8.4–10.5)
Chloride: 104 mEq/L (ref 96–112)
Creatinine, Ser: 0.85 mg/dL (ref 0.40–1.20)
GFR: 68.77 mL/min (ref >60.00)
Glucose, Bld: 95 mg/dL (ref 70–99)
Potassium: 3.8 mEq/L (ref 3.5–5.1)
Sodium: 140 mEq/L (ref 135–145)
Total Bilirubin: 0.5 mg/dL (ref 0.2–1.2)
Total Protein: 6.4 g/dL (ref 6.0–8.3)

## 2021-07-04 LAB — LIPID PANEL
Cholesterol: 160 mg/dL (ref 0–200)
HDL: 40.3 mg/dL (ref >39.00)
LDL Cholesterol: 89 mg/dL (ref 0–99)
NonHDL: 120.15
Total CHOL/HDL Ratio: 4
Triglycerides: 155 mg/dL — ABNORMAL HIGH (ref 0.0–149.0)
VLDL: 31 mg/dL (ref 0.0–40.0)

## 2021-07-04 LAB — VITAMIN D 25 HYDROXY (VIT D DEFICIENCY, FRACTURES): VITD: 27.64 ng/mL — ABNORMAL LOW (ref 30.00–100.00)

## 2021-07-04 LAB — VITAMIN B12: Vitamin B-12: 575 pg/mL (ref 211–911)

## 2021-07-04 LAB — TSH: TSH: 1.42 u[IU]/mL (ref 0.35–5.50)

## 2021-07-04 MED ORDER — BUPROPION HCL 75 MG PO TABS: 75.0000 mg | ORAL_TABLET | Freq: Two times a day (BID) | ORAL | 3 refills | Status: DC

## 2021-07-04 MED ORDER — TRIAMCINOLONE ACETONIDE 55 MCG/ACT NA AERO
2.0000 | INHALATION_SPRAY | Freq: Every day | NASAL | 0 refills | Status: DC
Start: 2021-07-04 — End: 2021-08-13

## 2021-07-04 MED ORDER — OXYBUTYNIN CHLORIDE ER 10 MG PO TB24: 10.0000 mg | ORAL_TABLET | Freq: Every day | ORAL | 11 refills | Status: DC

## 2021-07-04 MED ORDER — SHINGRIX 50 MCG/0.5ML IM SUSR: 0.5000 mL | Freq: Once | INTRAMUSCULAR | 0 refills | Status: AC

## 2021-07-04 MED ORDER — DENOSUMAB 60 MG/ML ~~LOC~~ SOSY
60.0000 mg | PREFILLED_SYRINGE | Freq: Once | SUBCUTANEOUS | Status: AC
  Administered 2021-07-04: 60 mg via SUBCUTANEOUS

## 2021-07-04 MED ORDER — OMEPRAZOLE 20 MG PO CPDR: DELAYED_RELEASE_CAPSULE | ORAL | 3 refills | Status: DC

## 2021-07-04 NOTE — Progress Notes (Signed)
?Subjective  ?Chief Complaint  ?Patient presents with  ? Annual Exam  ?  Pt here for annual exam and is currently fasting  ? ? ?HPI: Taylor Hopkins is a 71 y.o. female who presents to [facility] today for a Female Wellness Visit. She also has the concerns and/or needs as listed above in the chief complaint. These will be addressed in addition to the Health Maintenance Visit.  ? ?Wellness Visit: annual visit with health maintenance review and exam without Pap ? ?Health maintenance: Patient has been overdue for annual visit.  Mammogram is current and up-to-date.  I reviewed her bone density from August 2022.  Osteopenia, lowest T equals -2.4 at hip.  Elevated FRAX score.  She received Prolia today.  Her last injection was over 2 years ago.  Reports remote history of osteoporosis treated with biphosphonate's and stopped due to side effects.  History of traumatic ankle fracture.  History of adenomatous polyp: Due for colonoscopy.  Sees Dr. Fuller Plan ?Chronic disease f/u and/or acute problem visit: (deemed necessary to be done in addition to the wellness visit): ?Had COVID last year.  Had prolonged symptoms.  Just starting to feel better.  Has persistent cough.  Has allergies not currently treated.  Has been on Nasonex in the past ?History of vitamin D and B12 deficiencies no longer taking supplements. ?Major depression: This is much improved.  Maintained on Wellbutrin . No longer needing BuSpar.  She was able to work part-time for a while at [employer].  She has since stopped mostly due to her COVID symptoms. ?Fasting for cardiovascular and diabetes screening.  History of prediabetes.  Reports diet has been improved ?Assessment  ?1. Major depression, recurrent, chronic ([place])   ?2. Prediabetes   ?3. Age related osteoporosis, unspecified pathological fracture presence   ?4. Psychophysiological insomnia   ?5. Vitamin D deficiency   ?6. Adenomatous polyp of colon, unspecified part of colon   ?7.  Vitamin B12 deficiency   ?8. Overweight   ?9. Urinary incontinence, mixed   ?10. Seasonal allergic rhinitis due to pollen   ? ?  ?Plan  ?Female Wellness Visit: ?Age appropriate Health Maintenance and Prevention measures were discussed with patient. Included topics are cancer screening recommendations, ways to keep healthy (see AVS) including dietary and exercise recommendations, regular eye and dental care, use of seat belts, and avoidance of moderate alcohol use and tobacco use.  Patient to contact GI for colonoscopy ?BMI: discussed patient's BMI and encouraged positive lifestyle modifications to help get to or maintain a target BMI. ?HM needs and immunizations were addressed and ordered. See below for orders. See HM and immunization section for updates.  Counseling done for Shingrix vaccination prescription given ?Routine labs and screening tests ordered including cmp, cbc and lipids where appropriate. ?Discussed recommendations regarding Vit D and calcium supplementation (see AVS) ? ?Chronic disease management visit and/or acute problem visit: ?Depression is well controlled: Continue Wellbutrin 75 twice daily ?Prediabetes: Recheck A1c.  Rule out hyperlipidemia.  Work on weight loss. ?History of osteoporosis now on Prolia.  Indicated with elevated FRAX score.  Will monitor.  Check vitamin D levels, recommend calcium and vitamin D supplementation. ?Recheck B12 levels at risk for low B12 given chronic PPI ?GERD and history of adenomatous polyps: We will recheck with GI.  Dexilant was her preferred PPI but it was too costly.  Now on omeprazole. ?Urinary mixed incontinence: Overactive bladder symptoms.  Trial of Ditropan LA ?Restart Nasonex for allergies ?I  spent a total of 52 minutes for this patient encounter. Time spent included preparation, face-to-face counseling with the patient and coordination of care, review of chart and records, and documentation of the encounter. I reviewed old bone density results, gi  notes/records.  ? ?Follow up: 1 year for recheck. ?Orders Placed This Encounter  ?Procedures  ? CBC with Differential/Platelet  ? Comprehensive metabolic panel  ? Lipid panel  ? TSH  ? VITAMIN D 25 Hydroxy (Vit-D Deficiency, Fractures)  ? Vitamin B12  ? ?Meds ordered this encounter  ?Medications  ? Zoster Vaccine Adjuvanted [facility]) injection  ?  Sig: Inject 0.5 mLs into the muscle once for 1 dose. Please give 2nd dose 2-6 months after first dose  ?  Dispense:  2 each  ?  Refill:  0  ? buPROPion (WELLBUTRIN) 75 MG tablet  ?  Sig: Take 1 tablet (75 mg total) by mouth 2 (two) times daily.  ?  Dispense:  180 tablet  ?  Refill:  3  ? omeprazole (PRILOSEC) 20 MG capsule  ?  Sig: TAKE 1 CAPSULE BY MOUTH EVERY DAY  ?  Dispense:  90 capsule  ?  Refill:  3  ? triamcinolone (NASACORT) 55 MCG/ACT AERO nasal inhaler  ?  Sig: Place 2 sprays into the nose daily.  ?  Dispense:  1 each  ?  Refill:  0  ? oxybutynin (DITROPAN XL) 10 MG 24 hr tablet  ?  Sig: Take 1 tablet (10 mg total) by mouth at bedtime.  ?  Dispense:  30 tablet  ?  Refill:  11  ? ?  ? ?Body mass index is 29.41 kg/m?. ?Wt Readings from Last 3 Encounters:  ?07/04/21 182 lb 3.2 oz (82.6 kg)  ?03/28/20 183 lb 6.4 oz (83.2 kg)  ?07/26/19 203 lb 9.6 oz (92.4 kg)  ? ? ? ?Patient Active Problem List  ? Diagnosis Date Noted  ? Lumbar radiculopathy, chronic 07/26/2019  ?  Priority: High  ? Prediabetes 07/26/2019  ?  Priority: High  ? Insomnia 07/04/2018  ?  Priority: High  ? Osteoporosis 07/04/2018  ?  Priority: High  ?  ? H/o osteoporosis, failed biphosphonates due to gastritis. Don't have record. ?Last dexa 2019 with osteopenia T = -2.0 lowest.  ?DEXA June 2020: osteopenia T=-2.1 lowest. Reports h/o prior fracture. ?DEXA 2022: lowest T = -2.4 femur ? ?  ? Major depression, recurrent, chronic ([place]) 07/04/2018  ?  Priority: High  ?   failed zoloft, celexa, trazadone, and effexor and lexapro ?Failed prozac due to side effects: nausea ? ? ?  ? OSA (obstructive sleep apnea),  not on CPAP 07/04/2018  ?  Priority: High  ? Urinary, incontinence, stress female 03/28/2020  ?  Priority: Medium   ? Urinary incontinence, mixed 03/28/2020  ?  Priority: Medium   ? Gallstones 07/26/2019  ?  Priority: Medium   ?  asymtomatic ? ?  ? S/P dilatation of esophageal stricture 07/23/2018  ?  Priority: Medium   ? Colon polyp 07/04/2018  ?  Priority: Medium   ? IBS (irritable bowel syndrome) 07/04/2018  ?  Priority: Medium   ? Chronic neck pain due to DJD, DDD, unable to tolerate NSAIDs 07/04/2018  ?  Priority: Medium   ? GERD (gastroesophageal reflux disease) 10/02/2008  ?  Priority: Medium   ? Seasonal allergic rhinitis due to pollen 07/26/2019  ?  Priority: Low  ? Vitamin D deficiency 07/04/2018  ?  Priority: Low  ? Adenomatous  polyp of colon 07/04/2021  ? ?Health Maintenance  ?Topic Date Due  ? Zoster Vaccines- Shingrix (1 of 2) Never done  ? COVID-19 Vaccine (4 - Booster for [place] series) 07/20/2021 (Originally 05/19/2020)  ? COLONOSCOPY (Pts 45-67yr Insurance coverage will need to be confirmed)  07/16/2021  ? INFLUENZA VACCINE  09/24/2021  ? MAMMOGRAM  10/12/2021  ? DEXA SCAN  10/13/2022  ? Pneumonia Vaccine 72 Years old  Completed  ? Hepatitis C Screening  Completed  ? HPV VACCINES  Aged Out  ? TETANUS/TDAP  Discontinued  ? ?Immunization History  ?Administered Date(s) Administered  ? Fluad Quad(high Dose 65+) 11/19/2018  ? PFIZER(Purple Top)SARS-COV-2 Vaccination 06/03/2019, 06/24/2019, 03/24/2020  ? Pneumococcal Conjugate-13 03/28/2020  ? Pneumococcal Polysaccharide-23 11/19/2018  ? ?We updated and reviewed the patient's past history in detail and it is documented below. ?Allergies: ?Patient is allergic to prozac [fluoxetine hcl], sulfonamide derivatives, adhesive  [tape], and sulfa antibiotics. ?Past Medical History ?Patient  has a past medical history of Adenomatous colon polyp (06/2003), Allergic rhinitis, Anxiety, Arthritis, Cataract (2010), Depression, Gallstones (07/26/2019), GERD  (gastroesophageal reflux disease), and Sleep apnea. ?Past Surgical History ?Patient  has a past surgical history that includes Tonsillectomy; Lumbar disc surgery; Total abdominal hysterectomy; Ankle surgery (Right); [place]

## 2021-07-04 NOTE — Progress Notes (Signed)
Per orders of Dr. Jonni Sanger, injection of Prolia given in left arm per patient preference by Zacarias Pontes, CMA. Patient tolerated injection well.  ?

## 2021-07-04 NOTE — Patient Instructions (Addendum)
Please return in 12 months for your annual complete physical; please come fasting.  ? ?Please contact Dr. Lynne Leader office to get scheduled for your colonoscopy.  ?[phone]  ? ?Please take the prescription for Shingrix to the pharmacy so they may administer the vaccinations. Your insurance will then cover the injections.  ? ?If you have any questions or concerns, please don't hesitate to send me a message via MyChart or call the office at [phone]. Thank you for visiting with [place] today! It's our pleasure caring for you.  ?

## 2021-07-05 ENCOUNTER — Telehealth: Payer: Self-pay

## 2021-07-05 DIAGNOSIS — L650 Telogen effluvium: Secondary | ICD-10-CM | POA: Diagnosis not present

## 2021-07-05 NOTE — Telephone Encounter (Signed)
Patient has seen lab results.  She is requesting Vit D and Vit b12 inj.    I have advised that Dr. Jonni Sanger has not reviewed labs yet.    Please follow up with results at phone number attached to note.

## 2021-07-05 NOTE — Telephone Encounter (Signed)
Soon as Dr. Jonni Sanger has a chance to view labs and make recommendations, patient will be notified. ?

## 2021-07-08 ENCOUNTER — Other Ambulatory Visit: Payer: Self-pay

## 2021-07-08 DIAGNOSIS — E785 Hyperlipidemia, unspecified: Secondary | ICD-10-CM

## 2021-07-08 MED ORDER — ATORVASTATIN CALCIUM 10 MG PO TABS: 10.0000 mg | ORAL_TABLET | Freq: Every evening | ORAL | 0 refills | Status: DC

## 2021-07-13 NOTE — Telephone Encounter (Signed)
Last Prolia inj 07/04/21 Next Prolia inj due 01/05/22

## 2021-07-19 ENCOUNTER — Telehealth: Payer: Self-pay | Admitting: Family Medicine

## 2021-07-19 ENCOUNTER — Other Ambulatory Visit: Payer: Self-pay

## 2021-07-19 ENCOUNTER — Other Ambulatory Visit: Payer: Self-pay | Admitting: Family Medicine

## 2021-07-19 DIAGNOSIS — R1013 Epigastric pain: Secondary | ICD-10-CM

## 2021-07-19 DIAGNOSIS — K2100 Gastro-esophageal reflux disease with esophagitis, without bleeding: Secondary | ICD-10-CM

## 2021-07-19 MED ORDER — DEXLANSOPRAZOLE 60 MG PO CPDR: 60.0000 mg | DELAYED_RELEASE_CAPSULE | Freq: Every day | ORAL | 1 refills | Status: DC

## 2021-07-19 NOTE — Telephone Encounter (Signed)
..   Encourage patient to contact the pharmacy for refills or they can request refills through [place]:  07/04/21  NEXT APPOINTMENT DATE: 07/07/22  MEDICATION:dexlansoprazole (DEXILANT) 60 MG capsule   Is the patient out of medication?   PHARMACY: [id number] [place] [facility] [address] Phone:  [phone] Fax:  [phone]     Let patient know to contact pharmacy at the end of the day to make sure medication is ready.  Please notify patient to allow 48-72 hours to process

## 2021-07-19 NOTE — Telephone Encounter (Signed)
Rx sent 

## 2021-07-25 ENCOUNTER — Telehealth: Payer: Self-pay | Admitting: Family Medicine

## 2021-07-25 NOTE — Telephone Encounter (Signed)
Message sent thru MyChart 

## 2021-07-25 NOTE — Telephone Encounter (Signed)
Pt states she does not see her A1C results from her last labs. She also noticed a few other things were missing. She would like a call back. Please advise

## 2021-08-02 ENCOUNTER — Encounter: Payer: Self-pay | Admitting: Gastroenterology

## 2021-08-13 ENCOUNTER — Other Ambulatory Visit: Payer: Self-pay | Admitting: Family Medicine

## 2021-08-13 DIAGNOSIS — F5101 Primary insomnia: Secondary | ICD-10-CM

## 2021-08-13 DIAGNOSIS — E785 Hyperlipidemia, unspecified: Secondary | ICD-10-CM

## 2021-08-27 ENCOUNTER — Encounter (HOSPITAL_COMMUNITY): Payer: Self-pay

## 2021-08-27 ENCOUNTER — Other Ambulatory Visit: Payer: Self-pay

## 2021-08-27 ENCOUNTER — Emergency Department (HOSPITAL_COMMUNITY): Payer: Medicare Other

## 2021-08-27 ENCOUNTER — Emergency Department (HOSPITAL_COMMUNITY)
Admission: EM | Admit: 2021-08-27 | Discharge: 2021-08-27 | Disposition: A | Discharge status: Home / Self Care | Payer: Medicare Other | Attending: Emergency Medicine | Admitting: Emergency Medicine

## 2021-08-27 DIAGNOSIS — M25571 Pain in right ankle and joints of right foot: Secondary | ICD-10-CM | POA: Insufficient documentation

## 2021-08-27 DIAGNOSIS — M7989 Other specified soft tissue disorders: Secondary | ICD-10-CM | POA: Diagnosis not present

## 2021-08-27 DIAGNOSIS — X501XXA Overexertion from prolonged static or awkward postures, initial encounter: Secondary | ICD-10-CM | POA: Diagnosis not present

## 2021-08-27 DIAGNOSIS — S99911A Unspecified injury of right ankle, initial encounter: Secondary | ICD-10-CM | POA: Insufficient documentation

## 2021-08-27 DIAGNOSIS — R936 Abnormal findings on diagnostic imaging of limbs: Secondary | ICD-10-CM | POA: Insufficient documentation

## 2021-08-27 DIAGNOSIS — Z9889 Other specified postprocedural states: Secondary | ICD-10-CM | POA: Diagnosis not present

## 2021-08-27 DIAGNOSIS — M25471 Effusion, right ankle: Secondary | ICD-10-CM | POA: Diagnosis not present

## 2021-08-27 HISTORY — DX: COVID-19: U07.1

## 2021-08-27 MED ORDER — HYDROCODONE-ACETAMINOPHEN 5-325 MG PO TABS: 1.0000 | ORAL_TABLET | Freq: Four times a day (QID) | ORAL | 0 refills | Status: DC | PRN

## 2021-08-27 NOTE — ED Triage Notes (Signed)
Pt states she was stepping down from her recliner and her foot came from under her and she injured her right ankle today. Pt has 3+ swelling of right ankle. Pt has 2+ right pedal pulse, cap refill less than 3 sec, warm to touch. Pt able to wiggle toes. Pt denies numbness and tingling.

## 2021-08-27 NOTE — ED Provider Notes (Signed)
[facility] EMERGENCY DEPARTMENT Provider Note   CSN: [id number] Arrival date & time: 08/27/21  1219     History  Chief Complaint  Patient presents with   Ankle Injury    Taylor Hopkins is a 71 y.o. female.  71 year old female presents for evaluation of right ankle injury.  Patient reports that around 1030 this morning she stepped down from her recliner and her right foot twisted underneath her.  She has experienced pain to the right lateral malleolus since.  She is able to ambulate but with pain.  Denies other injury.  She reports prior fracture of the right ankle that was treated with ORIF in approximately 2000.  She is known to [facility].  The office was closed today and she decided to come to the ED for evaluation.  The history is provided by the patient and medical records.  Ankle Injury This is a new problem. The current episode started 3 to 5 hours ago. The problem occurs rarely. The problem has not changed since onset.Nothing aggravates the symptoms. Nothing relieves the symptoms.       Home Medications Prior to Admission medications   Medication Sig Start Date End Date Taking? Authorizing Provider  atorvastatin (LIPITOR) 10 MG tablet TAKE 1 TABLET BY MOUTH EVERYDAY AT BEDTIME 08/13/21   Leamon Arnt, MD  BIOTIN PO Take 3,000 mcg by mouth.    [provider]  buPROPion (WELLBUTRIN) 75 MG tablet Take 1 tablet (75 mg total) by mouth 2 (two) times daily. 07/04/21   Leamon Arnt, MD  Calcium Citrate-Vitamin D (CALCIUM + D PO) Take by mouth. Patient not taking: Reported on 07/04/2021    [provider]  Cyanocobalamin (VITAMIN B12 PO) Take by mouth. Patient not taking: Reported on 07/04/2021    [provider]  dexlansoprazole (DEXILANT) 60 MG capsule Take 1 capsule (60 mg total) by mouth daily. 07/19/21   Leamon Arnt, MD  glucose blood test strip Use as instructed 12/20/18   Briscoe Deutscher, DO   omeprazole (PRILOSEC) 20 MG capsule TAKE 1 CAPSULE BY MOUTH EVERY DAY 07/04/21   Leamon Arnt, MD  oxybutynin (DITROPAN XL) 10 MG 24 hr tablet Take 1 tablet (10 mg total) by mouth at bedtime. 07/04/21   Leamon Arnt, MD  traZODone (DESYREL) 50 MG tablet TAKE 0.5-1 TABLETS BY MOUTH AT BEDTIME AS NEEDED FOR SLEEP. 08/13/21   Leamon Arnt, MD  triamcinolone (NASACORT) 55 MCG/ACT AERO nasal inhaler PLACE 2 SPRAYS INTO THE NOSE DAILY. 08/13/21   Leamon Arnt, MD  valACYclovir (VALTREX) 1000 MG tablet TAKE 2 TABLETS BY MOUTH 2 (TWO) TIMES DAILY. ONCE, AS NEEDED FOR COLD SORES 02/19/21   Leamon Arnt, MD      Allergies    Prozac [fluoxetine hcl], Sulfonamide derivatives, Adhesive  [tape], and Sulfa antibiotics    Review of Systems   Review of Systems  All other systems reviewed and are negative.   Physical Exam Updated Vital Signs BP 137/71 (BP Location: Right Arm)   Pulse 75   Temp 98.2 F (36.8 C) (Oral)   Resp 16   Ht '5\' 6"'$  (1.676 m)   Wt 82.6 kg   SpO2 99%   BMI 29.39 kg/m  Physical Exam Vitals and nursing note reviewed.  Constitutional:      General: She is not in acute distress.    Appearance: Normal appearance. She is well-developed.  HENT:     Head: Normocephalic and  atraumatic.  Eyes:     Conjunctiva/sclera: Conjunctivae normal.     Pupils: Pupils are equal, round, and reactive to light.  Cardiovascular:     Rate and Rhythm: Normal rate and regular rhythm.     Heart sounds: Normal heart sounds.  Pulmonary:     Effort: Pulmonary effort is normal. No respiratory distress.     Breath sounds: Normal breath sounds.  Abdominal:     General: There is no distension.     Palpations: Abdomen is soft.     Tenderness: There is no abdominal tenderness.  Musculoskeletal:        General: Tenderness present. No deformity. Normal range of motion.     Cervical back: Normal range of motion and neck supple.     Comments: Mild tenderness and edema overlying the right  lateral malleolus.  No ankle instability noted.  Distal right lower extremity is neurovascular intact.  Skin:    General: Skin is warm and dry.  Neurological:     General: No focal deficit present.     Mental Status: She is alert and oriented to person, place, and time.     ED Results / Procedures / Treatments   Labs (all labs ordered are listed, but only abnormal results are displayed) Labs Reviewed - No data to display  EKG None  Radiology DG Ankle Complete Right  Result Date: 08/27/2021 CLINICAL DATA:  Fall, injury right ankle.  History of surgery EXAM: RIGHT ANKLE - COMPLETE 3+ VIEW COMPARISON:  None. FINDINGS: Postsurgical changes reflecting sideplate and screw fixation of the distal fibula and screw fixation of the medial malleolus are seen. There is no evidence of hardware fracture or perihardware lucency. There is a small ossific fragment adjacent to the lateral malleolus with overlying soft tissue swelling suspicious for acute avulsion fracture. Ankle alignment is normal.  The ankle mortise is intact. There is inferior calcaneal spurring and Achilles enthesopathy. IMPRESSION: 1. Small ossific fragment adjacent to the lateral malleolus suspicious for acute avulsion fracture with overlying soft tissue swelling. 2. Postsurgical changes in the distal tibia and fibula without evidence of hardware fracture or failure. Electronically Signed   By: Valetta Mole M.D.   On: 08/27/2021 12:50    Procedures Procedures    Medications Ordered in ED Medications - No data to display  ED Course/ Medical Decision Making/ A&P                           Medical Decision Making   Medical Screen Complete  This patient presented to the ED with complaint of right ankle pain after misstep.  This complaint involves an extensive number of treatment options. The initial differential diagnosis includes, but is not limited to, fracture versus sprain  This presentation is acute, self limited,  previously undiagnosed Patient presents with complaint of right ankle pain after misstep earlier this morning.  Patient's exam is concerning for possible fracture versus sprain.  Imaging reveals evidence of possible avulsion fracture.  Patient does have established outpatient orthopedic care with [facility].  Patient placed in cam boot and provided with crutches.  Patient does understand need for close outpatient follow-up.  Strict return precautions given and understood.   Additional history obtained:  External records from outside sources obtained and reviewed including prior ED visits and prior Inpatient records.    Imaging Studies ordered:  I ordered imaging studies including right ankle I independently visualized and interpreted obtained imaging which showed  possible avulsion fracture I agree with the radiologist interpretation. Problem List / ED Course:  Right ankle injury   Reevaluation:  After the interventions noted above, I reevaluated the patient and found that they have: improved Disposition:  After consideration of the diagnostic results and the patients response to treatment, I feel that the patent would benefit from close outpatient follow-up.          Final Clinical Impression(s) / ED Diagnoses Final diagnoses:  Injury of right ankle, initial encounter    Rx / DC Orders ED Discharge Orders          Ordered    HYDROcodone-acetaminophen (NORCO/VICODIN) 5-325 MG tablet  Every 6 hours PRN        08/27/21 1400              Valarie Merino, MD 08/27/21 1400

## 2021-08-27 NOTE — ED Provider Triage Note (Signed)
Emergency Medicine Provider Triage Evaluation Note  Taylor Hopkins , a 71 y.o. female  was evaluated in triage.  Pt complains of ankle injury occurring around 1030 this morning.  Patient states that she was stepping down from her recliner and her foot twisted underneath her.  Has been having some pain and swelling of the ankle since.  Review of Systems  Positive: Ankle injury Negative: Numbness, weakness  Physical Exam  BP 137/71 (BP Location: Right Arm)   Pulse 75   Temp 98.2 F (36.8 C) (Oral)   Resp 16   Ht '5\' 6"'$  (1.676 m)   Wt 82.6 kg   SpO2 99%   BMI 29.39 kg/m  Gen:   Awake, no distress   Resp:  Normal effort  MSK:   Moves extremities without difficulty  Other:  Neurovascularly intact, soft tissue swelling around the lateral ankle  Medical Decision Making  Medically screening exam initiated at 12:27 PM.  Appropriate orders placed.  Nicolle Heward Lemonds was informed that the remainder of the evaluation will be completed by another provider, this initial triage assessment does not replace that evaluation, and the importance of remaining in the ED until their evaluation is complete.  Workup initiated for ankle injury   Azeneth Carbonell T, PA-C 08/27/21 1228

## 2021-08-27 NOTE — Discharge Instructions (Signed)
Return for any problem.  Ice, elevate, and rest your right ankle.  Use walking boot and crutches as instructed.  Follow-up closely in the outpatient setting with [facility].

## 2021-08-27 NOTE — Progress Notes (Signed)
Orthopedic Tech Progress Note Patient Details:  Taylor Hopkins 01-31-1951 [id number]  Patient had a cane did not want crutches and stated " her husband may have a pair."  Ortho Devices Type of Ortho Device: CAM walker Ortho Device/Splint Location: RLE Ortho Device/Splint Interventions: Ordered, Application, Adjustment   Post Interventions Patient Tolerated: Well Instructions Provided: Care of [place] 08/27/2021, 3:14 PM

## 2021-08-30 DIAGNOSIS — M25571 Pain in right ankle and joints of right foot: Secondary | ICD-10-CM | POA: Diagnosis not present

## 2021-09-06 DIAGNOSIS — M25571 Pain in right ankle and joints of right foot: Secondary | ICD-10-CM | POA: Diagnosis not present

## 2021-10-04 DIAGNOSIS — M25571 Pain in right ankle and joints of right foot: Secondary | ICD-10-CM | POA: Diagnosis not present

## 2021-10-09 DIAGNOSIS — S93401D Sprain of unspecified ligament of right ankle, subsequent encounter: Secondary | ICD-10-CM | POA: Diagnosis not present

## 2021-10-09 DIAGNOSIS — S8261XD Displaced fracture of lateral malleolus of right fibula, subsequent encounter for closed fracture with routine healing: Secondary | ICD-10-CM | POA: Diagnosis not present

## 2021-10-13 ENCOUNTER — Other Ambulatory Visit: Payer: Self-pay | Admitting: Family Medicine

## 2021-10-13 DIAGNOSIS — F5101 Primary insomnia: Secondary | ICD-10-CM

## 2021-10-14 DIAGNOSIS — S8261XD Displaced fracture of lateral malleolus of right fibula, subsequent encounter for closed fracture with routine healing: Secondary | ICD-10-CM | POA: Diagnosis not present

## 2021-10-14 DIAGNOSIS — S93401D Sprain of unspecified ligament of right ankle, subsequent encounter: Secondary | ICD-10-CM | POA: Diagnosis not present

## 2021-10-15 ENCOUNTER — Other Ambulatory Visit: Payer: Self-pay

## 2021-10-15 ENCOUNTER — Telehealth: Payer: Self-pay | Admitting: Family Medicine

## 2021-10-15 DIAGNOSIS — F5101 Primary insomnia: Secondary | ICD-10-CM

## 2021-10-15 MED ORDER — TRAZODONE HCL 50 MG PO TABS: 25.0000 mg | ORAL_TABLET | Freq: Every evening | ORAL | 0 refills | Status: DC | PRN

## 2021-10-15 NOTE — Telephone Encounter (Signed)
Patient states: - Pharmacy informed her that she can't pick up refill of trazodone 50 mg until 09/09 in order for it to be covered by insurance. - Patient needs medication before then since she is leaving for a 16 day cruise on 09/06.    Patient requests: - Recommendations on how she can get a refill of her medication prior to 09/06.

## 2021-10-16 ENCOUNTER — Telehealth (INDEPENDENT_AMBULATORY_CARE_PROVIDER_SITE_OTHER): Payer: Medicare Other | Admitting: Family Medicine

## 2021-10-16 ENCOUNTER — Encounter: Payer: Self-pay | Admitting: Family Medicine

## 2021-10-16 VITALS — Ht 66.0 in | Wt 175.0 lb

## 2021-10-16 DIAGNOSIS — S93401D Sprain of unspecified ligament of right ankle, subsequent encounter: Secondary | ICD-10-CM | POA: Diagnosis not present

## 2021-10-16 DIAGNOSIS — F339 Major depressive disorder, recurrent, unspecified: Secondary | ICD-10-CM | POA: Diagnosis not present

## 2021-10-16 DIAGNOSIS — S8261XD Displaced fracture of lateral malleolus of right fibula, subsequent encounter for closed fracture with routine healing: Secondary | ICD-10-CM | POA: Diagnosis not present

## 2021-10-16 DIAGNOSIS — F5101 Primary insomnia: Secondary | ICD-10-CM

## 2021-10-16 MED ORDER — BUSPIRONE HCL 7.5 MG PO TABS: 7.5000 mg | ORAL_TABLET | Freq: Three times a day (TID) | ORAL | 11 refills | Status: DC | PRN

## 2021-10-16 MED ORDER — TRAZODONE HCL 50 MG PO TABS: 25.0000 mg | ORAL_TABLET | Freq: Every evening | ORAL | 3 refills | Status: DC | PRN

## 2021-10-16 NOTE — Progress Notes (Signed)
Virtual Visit via Video Note  SUBJECTIVE CC:  Chief Complaint  Patient presents with   Anxiety    Pt stated that she has been dealing with Anxiety for the past yr and it has gotten worse and possibly get some medication to ease her anxiety.     I connected with Taylor Hopkins on 10/16/21 at  4:00 PM EDT by a video enabled telemedicine application and verified that I am speaking with the correct person using two identifiers. Location patient: Home Location provider: [facility] [place] participating in the virtual visit: Taylor Hopkins, Leamon Arnt, MD Darlina Rumpf, CMA   I discussed the limitations of evaluation and management by telemedicine and the availability of in person appointments. The patient expressed understanding and agreed to proceed.  HPI: Taylor Hopkins is a 71 y.o. female who was contacted today to address the problems listed above in the chief complaint/mood. Long standing depression with anxiety controlled on wellbutrin 75 bid until recently: anxiety is worsening due to stressors: has had several health issues, recent breast surgery and has a vacation coming up but feels overwhelmed. Had been on buspar in past which worked well. No panic sxs. She also is worried about her husband's health.  Insomnia: uses trazadone nightly and needs refill. Reports sleep is good on meds.     ASSESSMENT 1. Major depression, recurrent, chronic ([place])   2. Primary insomnia     Depression/anxiety:  active. Counseling. Add buspar 7.5 tid prn to wellbutrin 75 bid.  Insomnia is controlled on trazadone. Refilled.   I discussed the assessment and treatment plan with the patient. The patient was provided an opportunity to ask questions and all were answered. The patient agreed with the plan and demonstrated an understanding of the instructions.   The patient was advised to call back or seek an in-person evaluation if the symptoms worsen or  if the condition fails to improve as anticipated. Follow up: may for cpe  04/10/2022  Meds ordered this encounter  Medications   busPIRone (BUSPAR) 7.5 MG tablet    Sig: Take 1 tablet (7.5 mg total) by mouth 3 (three) times daily as needed.    Dispense:  60 tablet    Refill:  11   traZODone (DESYREL) 50 MG tablet    Sig: Take 0.5-1 tablets (25-50 mg total) by mouth at bedtime as needed. for sleep    Dispense:  90 tablet    Refill:  3    Please disregard the prior refill for 30.      I reviewed the patients updated PMH, FH, and SocHx.    Patient Active Problem List   Diagnosis Date Noted   Adenomatous polyp of colon 07/04/2021    Priority: High   Lumbar radiculopathy, chronic 07/26/2019    Priority: High   Prediabetes 07/26/2019    Priority: High   Insomnia 07/04/2018    Priority: High   Osteoporosis 07/04/2018    Priority: High   Major depression, recurrent, chronic ([place]) 07/04/2018    Priority: High   OSA (obstructive sleep apnea), not on CPAP 07/04/2018    Priority: High   Urinary, incontinence, stress female 03/28/2020    Priority: Medium    Urinary incontinence, mixed 03/28/2020    Priority: Medium    Gallstones 07/26/2019    Priority: Medium    S/P dilatation of esophageal stricture 07/23/2018    Priority: Medium    Colon polyp 07/04/2018  Priority: Medium    IBS (irritable bowel syndrome) 07/04/2018    Priority: Medium    Chronic neck pain due to DJD, DDD, unable to tolerate NSAIDs 07/04/2018    Priority: Medium    GERD (gastroesophageal reflux disease) 10/02/2008    Priority: Medium    Seasonal allergic rhinitis due to pollen 07/26/2019    Priority: Low   Vitamin D deficiency 07/04/2018    Priority: Low   Current Meds  Medication Sig   atorvastatin (LIPITOR) 10 MG tablet TAKE 1 TABLET BY MOUTH EVERYDAY AT BEDTIME   BIOTIN PO Take 3,000 mcg by mouth.   buPROPion (WELLBUTRIN) 75 MG tablet Take 1 tablet (75 mg total) by mouth 2 (two) times daily.    dexlansoprazole (DEXILANT) 60 MG capsule Take 1 capsule (60 mg total) by mouth daily.   glucose blood test strip Use as instructed   [DISCONTINUED] traZODone (DESYREL) 50 MG tablet Take 0.5-1 tablets (25-50 mg total) by mouth at bedtime as needed. for sleep    Allergies: Patient is allergic to prozac [fluoxetine hcl], sulfonamide derivatives, adhesive  [tape], and sulfa antibiotics. Family History: Patient family history includes Allergies in her brother; Colon polyps in her father; Diabetes in her mother; Diverticulitis in her father; Esophageal cancer in her maternal grandmother; Heart attack in her father; Heart failure in her father and mother; Kidney failure in her mother; Mitral valve prolapse in her mother; Stomach cancer in her paternal uncle. Social History:  Patient  reports that she has never smoked. She has never used smokeless tobacco. She reports current alcohol use. She reports that she does not use drugs.  Review of Systems: Constitutional: Negative for fever malaise or anorexia Cardiovascular: negative for chest pain Respiratory: negative for SOB or persistent cough Gastrointestinal: negative for abdominal pain  OBJECTIVE/OBSERVATIONS: General: no acute distress, well appearing, no apparent distress, well groomed Psych:  Alert and oriented x 3,normal mood, behavior, speech, dress, and thought processes. Tearful during interview  Leamon Arnt, MD

## 2021-10-21 DIAGNOSIS — S93401D Sprain of unspecified ligament of right ankle, subsequent encounter: Secondary | ICD-10-CM | POA: Diagnosis not present

## 2021-10-21 DIAGNOSIS — S8261XD Displaced fracture of lateral malleolus of right fibula, subsequent encounter for closed fracture with routine healing: Secondary | ICD-10-CM | POA: Diagnosis not present

## 2021-10-24 DIAGNOSIS — S8261XD Displaced fracture of lateral malleolus of right fibula, subsequent encounter for closed fracture with routine healing: Secondary | ICD-10-CM | POA: Diagnosis not present

## 2021-10-24 DIAGNOSIS — S93401D Sprain of unspecified ligament of right ankle, subsequent encounter: Secondary | ICD-10-CM | POA: Diagnosis not present

## 2021-10-29 ENCOUNTER — Telehealth: Payer: Self-pay | Admitting: Family Medicine

## 2021-10-29 NOTE — Telephone Encounter (Signed)
Pt states: -Leaving on a [place] cruise 10/30/21 at 8:45 am. -Friends with whom pt is travelling were able to get a Rx for an anti-viral incase the contracted COVID-19.   Pt Requests -PCP team write a prescription for patient today (09/05) before 5:00pm. ---If PCP team cannot, a different provider to write Rx. -Call back to update on decision.   Pt declined OV  Preferred pharmacy:  [id number] [place]  [address] [id number] Phone:  [phone] Fax:  [phone] DEA #:  [id number]

## 2021-11-13 DIAGNOSIS — S8261XD Displaced fracture of lateral malleolus of right fibula, subsequent encounter for closed fracture with routine healing: Secondary | ICD-10-CM | POA: Diagnosis not present

## 2021-11-13 DIAGNOSIS — Z1231 Encounter for screening mammogram for malignant neoplasm of breast: Secondary | ICD-10-CM | POA: Diagnosis not present

## 2021-11-13 LAB — HM MAMMOGRAPHY

## 2021-11-18 ENCOUNTER — Encounter: Payer: Self-pay | Admitting: *Deleted

## 2021-11-20 ENCOUNTER — Encounter: Payer: Self-pay | Admitting: Family Medicine

## 2021-12-02 DIAGNOSIS — J069 Acute upper respiratory infection, unspecified: Secondary | ICD-10-CM | POA: Diagnosis not present

## 2021-12-02 DIAGNOSIS — J029 Acute pharyngitis, unspecified: Secondary | ICD-10-CM | POA: Diagnosis not present

## 2021-12-04 NOTE — Telephone Encounter (Signed)
Prolia VOB initiated via [email or web address]  Last Prolia inj 07/04/21 Next Prolia inj due 01/05/22  

## 2021-12-15 NOTE — Telephone Encounter (Signed)
Pt ready for scheduling on or after 01/05/22  Out-of-pocket cost due at time of visit: $0  Primary: Medicare Prolia co-insurance: 20% (approximately $276) Admin fee co-insurance: 20% (approximately $25)  Deductible: $226 of $226 met  Secondary: UHC AARP Medicare Supp Prolia co-insurance: Covers Medicare Part B co-insurance Admin fee co-insurance: Covers Medicare Part B co-insurance  Deductible: does NOT cover Medicare deductible of which $226 of $226 has been met  Prior Auth: NOT required PA# Valid:   ** This summary of benefits is an estimation of the patient's out-of-pocket cost. Exact cost may vary based on individual plan coverage.

## 2021-12-25 ENCOUNTER — Other Ambulatory Visit: Payer: Self-pay | Admitting: Family Medicine

## 2021-12-25 DIAGNOSIS — E785 Hyperlipidemia, unspecified: Secondary | ICD-10-CM

## 2022-01-09 ENCOUNTER — Ambulatory Visit (INDEPENDENT_AMBULATORY_CARE_PROVIDER_SITE_OTHER): Payer: Medicare Other | Admitting: *Deleted

## 2022-01-09 DIAGNOSIS — M8580 Other specified disorders of bone density and structure, unspecified site: Secondary | ICD-10-CM

## 2022-01-09 MED ORDER — DENOSUMAB 60 MG/ML ~~LOC~~ SOSY
60.0000 mg | PREFILLED_SYRINGE | Freq: Once | SUBCUTANEOUS | Status: AC
  Administered 2022-01-09: 60 mg via SUBCUTANEOUS

## 2022-01-09 NOTE — Progress Notes (Signed)
Per orders of Dr. Jonni Sanger, injection of Prolia injection given in right arm per patient preference by Taylor Hopkins, CMA. Patient tolerated injection well. Patient reminded to schedule next injection.

## 2022-01-13 NOTE — Telephone Encounter (Signed)
Forwarding to Rx prior auth team.

## 2022-01-13 NOTE — Telephone Encounter (Signed)
Last Prolia injection 01/09/22 Next Prolia injection due 07/09/22

## 2022-02-06 ENCOUNTER — Encounter: Payer: Self-pay | Admitting: *Deleted

## 2022-02-14 ENCOUNTER — Ambulatory Visit
Admission: RE | Admit: 2022-02-14 | Discharge: 2022-02-14 | Disposition: A | Discharge status: Home / Self Care | Payer: Medicare Other | Source: Ambulatory Visit | Attending: Physician Assistant | Admitting: Physician Assistant

## 2022-02-14 VITALS — BP 132/75 | HR 88 | Temp 98.2°F | Resp 16

## 2022-02-14 DIAGNOSIS — J209 Acute bronchitis, unspecified: Secondary | ICD-10-CM

## 2022-02-14 DIAGNOSIS — J019 Acute sinusitis, unspecified: Secondary | ICD-10-CM | POA: Diagnosis not present

## 2022-02-14 DIAGNOSIS — H1032 Unspecified acute conjunctivitis, left eye: Secondary | ICD-10-CM

## 2022-02-14 MED ORDER — POLYMYXIN B-TRIMETHOPRIM 10000-0.1 UNIT/ML-% OP SOLN: 1.0000 [drp] | OPHTHALMIC | 0 refills | Status: AC

## 2022-02-14 MED ORDER — PREDNISONE 20 MG PO TABS: 40.0000 mg | ORAL_TABLET | Freq: Every day | ORAL | 0 refills | Status: AC

## 2022-02-14 MED ORDER — AMOXICILLIN-POT CLAVULANATE 875-125 MG PO TABS
1.0000 | ORAL_TABLET | Freq: Two times a day (BID) | ORAL | 0 refills | Status: DC
Start: 2022-02-14 — End: 2022-03-06

## 2022-02-14 NOTE — ED Provider Notes (Signed)
[facility]    CSN: [id number] Arrival date & time: 02/14/22  0857      History   Chief Complaint Chief Complaint  Patient presents with   Sore Throat    Sore throat and ear - Entered by patient    HPI Taylor Hopkins is a 71 y.o. female.   Patient here today for evaluation of runny nose, cough, congestion, ear pain, and left eye drainage and pain that started about 3 days ago. She reports she did have nasal congestion and drainage prior to that but it worsened over the last 3 days. She has tried mucinex, cough medication and nasal spray without resolution. She denies any fever. She has not had any vomiting or diarrhea.   The history is provided by the patient.  Sore Throat Pertinent negatives include no shortness of breath.    Past Medical History:  Diagnosis Date   Adenomatous colon polyp 06/2003   Allergic rhinitis    Anxiety    Arthritis    Cataract 2010   bilateral eyes   COVID-19    Depression    Gallstones 07/26/2019   asymtomatic   GERD (gastroesophageal reflux disease)    Sleep apnea    Not currently using CPAP - last sleep study 2016    Patient Active Problem List   Diagnosis Date Noted   Adenomatous polyp of colon 07/04/2021   Urinary, incontinence, stress female 03/28/2020   Urinary incontinence, mixed 03/28/2020   Lumbar radiculopathy, chronic 07/26/2019   Seasonal allergic rhinitis due to pollen 07/26/2019   Prediabetes 07/26/2019   Gallstones 07/26/2019   S/P dilatation of esophageal stricture 07/23/2018   Colon polyp 07/04/2018   IBS (irritable bowel syndrome) 07/04/2018   Chronic neck pain due to DJD, DDD, unable to tolerate NSAIDs 07/04/2018   Vitamin D deficiency 07/04/2018   Insomnia 07/04/2018   Osteoporosis 07/04/2018   Major depression, recurrent, chronic ([place]) 07/04/2018   OSA (obstructive sleep apnea), not on CPAP 07/04/2018   GERD (gastroesophageal reflux disease) 10/02/2008    Past Surgical History:   Procedure Laterality Date   ANKLE SURGERY Right    BREAST ENHANCEMENT SURGERY     COLONOSCOPY     LUMBAR DISC SURGERY     TONSILLECTOMY     TOTAL ABDOMINAL HYSTERECTOMY     UPPER GASTROINTESTINAL ENDOSCOPY      OB History   No obstetric history on file.      Home Medications    Prior to Admission medications   Medication Sig Start Date End Date Taking? Authorizing Provider  amoxicillin-clavulanate (AUGMENTIN) 875-125 MG tablet Take 1 tablet by mouth every 12 (twelve) hours. 02/14/22  Yes Francene Finders, PA-C  predniSONE (DELTASONE) 20 MG tablet Take 2 tablets (40 mg total) by mouth daily with breakfast for 5 days. 02/14/22 02/19/22 Yes Francene Finders, PA-C  trimethoprim-polymyxin b (POLYTRIM) ophthalmic solution Place 1 drop into the left eye every 4 (four) hours for 7 days. 02/14/22 02/21/22 Yes Francene Finders, PA-C  atorvastatin (LIPITOR) 10 MG tablet TAKE 1 TABLET BY MOUTH EVERYDAY AT BEDTIME 12/25/21   Leamon Arnt, MD  BIOTIN PO Take 3,000 mcg by mouth.    [provider]  buPROPion (WELLBUTRIN) 75 MG tablet Take 1 tablet (75 mg total) by mouth 2 (two) times daily. 07/04/21   Leamon Arnt, MD  busPIRone (BUSPAR) 7.5 MG tablet Take 1 tablet (7.5 mg total) by mouth 3 (three) times daily as needed. 10/16/21   Jonni Sanger,  Karie Fetch, MD  Calcium Citrate-Vitamin D (CALCIUM + D PO) Take by mouth. Patient not taking: Reported on 07/04/2021    [provider]  Cyanocobalamin (VITAMIN B12 PO) Take by mouth. Patient not taking: Reported on 07/04/2021    [provider]  dexlansoprazole (DEXILANT) 60 MG capsule Take 1 capsule (60 mg total) by mouth daily. 07/19/21   Leamon Arnt, MD  glucose blood test strip Use as instructed 12/20/18   Briscoe Deutscher, DO  omeprazole (PRILOSEC) 20 MG capsule TAKE 1 CAPSULE BY MOUTH EVERY DAY Patient not taking: Reported on 10/16/2021 07/04/21   Leamon Arnt, MD  oxybutynin (DITROPAN XL) 10 MG 24 hr tablet Take 1 tablet (10  mg total) by mouth at bedtime. Patient not taking: Reported on 10/16/2021 07/04/21   Leamon Arnt, MD  traZODone (DESYREL) 50 MG tablet Take 0.5-1 tablets (25-50 mg total) by mouth at bedtime as needed. for sleep 10/16/21   Leamon Arnt, MD  valACYclovir (VALTREX) 1000 MG tablet TAKE 2 TABLETS BY MOUTH 2 (TWO) TIMES DAILY. ONCE, AS NEEDED FOR COLD SORES Patient not taking: Reported on 10/16/2021 02/19/21   Leamon Arnt, MD    Family History Family History  Problem Relation Age of Onset   Allergies Brother    Heart attack Father    Colon polyps Father    Diverticulitis Father    Heart failure Father    Esophageal cancer Maternal Grandmother    Heart failure Mother    Mitral valve prolapse Mother    Diabetes Mother    Kidney failure Mother    Stomach cancer Paternal Uncle    Colon cancer Neg Hx    Rectal cancer Neg Hx     Social History Social History   Tobacco Use   Smoking status: Never   Smokeless tobacco: Never  Vaping Use   Vaping Use: Never used  Substance Use Topics   Alcohol use: Yes    Alcohol/week: 0.0 standard drinks of alcohol    Comment: 1 per month   Drug use: No     Allergies   Prozac [fluoxetine hcl], Sulfonamide derivatives, Adhesive  [tape], and Sulfa antibiotics   Review of Systems Review of Systems  Constitutional:  Negative for chills and fever.  HENT:  Positive for congestion, ear pain, sinus pressure and sore throat.   Eyes:  Positive for pain, discharge and redness. Negative for photophobia.  Respiratory:  Positive for cough. Negative for shortness of breath and wheezing.   Gastrointestinal:  Negative for diarrhea, nausea and vomiting.     Physical Exam Triage Vital Signs ED Triage Vitals  Enc Vitals Group     BP 02/14/22 0931 132/75     Pulse Rate 02/14/22 0931 88     Resp 02/14/22 0931 16     Temp 02/14/22 0931 98.2 F (36.8 C)     Temp Source 02/14/22 0931 Oral     SpO2 02/14/22 0931 98 %     Weight --      Height --       Head Circumference --      Peak Flow --      Pain Score 02/14/22 0930 5     Pain Loc --      Pain Edu? --      Excl. in [place]? --    No data found.  Updated Vital Signs BP 132/75   Pulse 88   Temp 98.2 F (36.8 C) (Oral)   Resp 16  SpO2 98%   Visual Acuity Right Eye Distance: 20/35 Left Eye Distance: 20/75 Bilateral Distance: 20/25     Physical Exam Vitals and nursing note reviewed.  Constitutional:      General: She is not in acute distress.    Appearance: Normal appearance. She is not ill-appearing.  HENT:     Head: Normocephalic and atraumatic.     Ears:     Comments: Bilateral TMS dull    Nose: Congestion present.     Mouth/Throat:     Mouth: Mucous membranes are moist.     Pharynx: No oropharyngeal exudate or posterior oropharyngeal erythema.  Eyes:     Extraocular Movements: Extraocular movements intact.     Pupils: Pupils are equal, round, and reactive to light.     Comments: Left conjunctiva injected with mild swelling, clear drainage, crusting to lashes, mild swelling and erythema to left upper and lower lids, normal right eye, conjunctiva  Cardiovascular:     Rate and Rhythm: Normal rate and regular rhythm.     Heart sounds: Normal heart sounds. No murmur heard. Pulmonary:     Effort: Pulmonary effort is normal. No respiratory distress.     Breath sounds: Normal breath sounds. No wheezing, rhonchi or rales.  Skin:    General: Skin is warm and dry.  Neurological:     Mental Status: She is alert.  Psychiatric:        Mood and Affect: Mood normal.        Thought Content: Thought content normal.      UC Treatments / Results  Labs (all labs ordered are listed, but only abnormal results are displayed) Labs Reviewed - No data to display  EKG   Radiology No results found.  Procedures Procedures (including critical care time)  Medications Ordered in UC Medications - No data to display  Initial Impression / Assessment and Plan / UC Course  I  have reviewed the triage vital signs and the nursing notes.  Pertinent labs & imaging results that were available during my care of the patient were reviewed by me and considered in my medical decision making (see chart for details).    Will treat to cover sinusitis, bronchitis, and conjunctivitis. Encouraged follow up if no gradual improvement or with any further concerns. Patient expresses understanding.   Final Clinical Impressions(s) / UC Diagnoses   Final diagnoses:  Acute sinusitis, recurrence not specified, unspecified location  Acute bronchitis, unspecified organism  Acute conjunctivitis of left eye, unspecified acute conjunctivitis type   Discharge Instructions   None    ED Prescriptions     Medication Sig Dispense Auth. Provider   trimethoprim-polymyxin b (POLYTRIM) ophthalmic solution Place 1 drop into the left eye every 4 (four) hours for 7 days. 10 mL Francene Finders, PA-C   predniSONE (DELTASONE) 20 MG tablet Take 2 tablets (40 mg total) by mouth daily with breakfast for 5 days. 10 tablet Francene Finders, PA-C   amoxicillin-clavulanate (AUGMENTIN) 875-125 MG tablet Take 1 tablet by mouth every 12 (twelve) hours. 14 tablet Francene Finders, PA-C      PDMP not reviewed this encounter.   Francene Finders, PA-C 02/14/22 1104

## 2022-02-14 NOTE — ED Triage Notes (Signed)
Pt presents to [facility] with co of runny nose, cough and congestion, otalgia and L eye pain, redness and drainage. Pt reoprts symptoms picked up about 3 days ago. Pt reports musinex and cough medication and nasal spray and eye drops

## 2022-02-28 ENCOUNTER — Encounter: Payer: Self-pay | Admitting: Emergency Medicine

## 2022-02-28 ENCOUNTER — Ambulatory Visit
Admission: EM | Admit: 2022-02-28 | Discharge: 2022-02-28 | Disposition: A | Discharge status: Home / Self Care | Payer: Medicare Other | Attending: Physician Assistant | Admitting: Physician Assistant

## 2022-02-28 ENCOUNTER — Ambulatory Visit: Payer: Self-pay

## 2022-02-28 DIAGNOSIS — J069 Acute upper respiratory infection, unspecified: Secondary | ICD-10-CM

## 2022-02-28 DIAGNOSIS — J209 Acute bronchitis, unspecified: Secondary | ICD-10-CM | POA: Diagnosis not present

## 2022-02-28 DIAGNOSIS — J0100 Acute maxillary sinusitis, unspecified: Secondary | ICD-10-CM | POA: Diagnosis not present

## 2022-02-28 DIAGNOSIS — R112 Nausea with vomiting, unspecified: Secondary | ICD-10-CM | POA: Diagnosis not present

## 2022-02-28 MED ORDER — DOXYCYCLINE HYCLATE 100 MG PO CAPS: 100.0000 mg | ORAL_CAPSULE | Freq: Two times a day (BID) | ORAL | 0 refills | Status: DC

## 2022-02-28 MED ORDER — ONDANSETRON HCL 4 MG PO TABS: 4.0000 mg | ORAL_TABLET | Freq: Three times a day (TID) | ORAL | 0 refills | Status: DC | PRN

## 2022-02-28 NOTE — ED Provider Notes (Signed)
[facility]    CSN: [id number] Arrival date & time: 02/28/22  1501      History   Chief Complaint Chief Complaint  Patient presents with   Emesis    HPI ARMANDO LAUMAN is a 72 y.o. female.   72 year old female presents with nausea and vomiting, sinus congestion postnasal drip and rhinitis with mainly purulent production.  Patient indicates that she was in [employer] yesterday after they ate supper last night she started having nausea and vomiting episodes without diarrhea.  She relates that her husband has similar symptoms.  She indicates that he did eat different dinner products.  Patient is concerned that she still may have sinus infection due to her upper respiratory congestion, facial discomfort and pressure, rhinitis with purulent production.  Patient also has been having cough, congestion, mild intermittent sore throat, with mild chills.  Patient indicates she is not tolerating fluids well.  Patient denies having wheezing or shortness of breath.   Emesis Associated symptoms: chills and cough     Past Medical History:  Diagnosis Date   Adenomatous colon polyp 06/2003   Allergic rhinitis    Anxiety    Arthritis    Cataract 2010   bilateral eyes   COVID-19    Depression    Gallstones 07/26/2019   asymtomatic   GERD (gastroesophageal reflux disease)    Sleep apnea    Not currently using CPAP - last sleep study 2016    Patient Active Problem List   Diagnosis Date Noted   Adenomatous polyp of colon 07/04/2021   Urinary, incontinence, stress female 03/28/2020   Urinary incontinence, mixed 03/28/2020   Lumbar radiculopathy, chronic 07/26/2019   Seasonal allergic rhinitis due to pollen 07/26/2019   Prediabetes 07/26/2019   Gallstones 07/26/2019   S/P dilatation of esophageal stricture 07/23/2018   Colon polyp 07/04/2018   IBS (irritable bowel syndrome) 07/04/2018   Chronic neck pain due to DJD, DDD, unable to tolerate NSAIDs 07/04/2018    Vitamin D deficiency 07/04/2018   Insomnia 07/04/2018   Osteoporosis 07/04/2018   Major depression, recurrent, chronic ([place]) 07/04/2018   OSA (obstructive sleep apnea), not on CPAP 07/04/2018   GERD (gastroesophageal reflux disease) 10/02/2008    Past Surgical History:  Procedure Laterality Date   ANKLE SURGERY Right    BREAST ENHANCEMENT SURGERY     COLONOSCOPY     LUMBAR DISC SURGERY     TONSILLECTOMY     TOTAL ABDOMINAL HYSTERECTOMY     UPPER GASTROINTESTINAL ENDOSCOPY      OB History   No obstetric history on file.      Home Medications    Prior to Admission medications   Medication Sig Start Date End Date Taking? Authorizing Provider  atorvastatin (LIPITOR) 10 MG tablet TAKE 1 TABLET BY MOUTH EVERYDAY AT BEDTIME 12/25/21  Yes Leamon Arnt, MD  BIOTIN PO Take 3,000 mcg by mouth.   Yes [provider]  buPROPion (WELLBUTRIN) 75 MG tablet Take 1 tablet (75 mg total) by mouth 2 (two) times daily. 07/04/21  Yes Leamon Arnt, MD  busPIRone (BUSPAR) 7.5 MG tablet Take 1 tablet (7.5 mg total) by mouth 3 (three) times daily as needed. 10/16/21  Yes Leamon Arnt, MD  dexlansoprazole (DEXILANT) 60 MG capsule Take 1 capsule (60 mg total) by mouth daily. 07/19/21  Yes Leamon Arnt, MD  doxycycline (VIBRAMYCIN) 100 MG capsule Take 1 capsule (100 mg total) by mouth 2 (two) times daily. 02/28/22  Yes  Nyoka Lint, PA-C  glucose blood test strip Use as instructed 12/20/18  Yes Briscoe Deutscher, DO  ondansetron (ZOFRAN) 4 MG tablet Take 1 tablet (4 mg total) by mouth every 8 (eight) hours as needed for nausea or vomiting. 02/28/22  Yes Nyoka Lint, PA-C  traZODone (DESYREL) 50 MG tablet Take 0.5-1 tablets (25-50 mg total) by mouth at bedtime as needed. for sleep 10/16/21  Yes Leamon Arnt, MD  amoxicillin-clavulanate (AUGMENTIN) 875-125 MG tablet Take 1 tablet by mouth every 12 (twelve) hours. 02/14/22   Francene Finders, PA-C  Calcium Citrate-Vitamin D (CALCIUM + D PO) Take  by mouth. Patient not taking: Reported on 07/04/2021    [provider]  Cyanocobalamin (VITAMIN B12 PO) Take by mouth. Patient not taking: Reported on 07/04/2021    [provider]  omeprazole (PRILOSEC) 20 MG capsule TAKE 1 CAPSULE BY MOUTH EVERY DAY Patient not taking: Reported on 10/16/2021 07/04/21   Leamon Arnt, MD  oxybutynin (DITROPAN XL) 10 MG 24 hr tablet Take 1 tablet (10 mg total) by mouth at bedtime. Patient not taking: Reported on 10/16/2021 07/04/21   Leamon Arnt, MD  valACYclovir (VALTREX) 1000 MG tablet TAKE 2 TABLETS BY MOUTH 2 (TWO) TIMES DAILY. ONCE, AS NEEDED FOR COLD SORES Patient not taking: Reported on 10/16/2021 02/19/21   Leamon Arnt, MD    Family History Family History  Problem Relation Age of Onset   Allergies Brother    Heart attack Father    Colon polyps Father    Diverticulitis Father    Heart failure Father    Esophageal cancer Maternal Grandmother    Heart failure Mother    Mitral valve prolapse Mother    Diabetes Mother    Kidney failure Mother    Stomach cancer Paternal Uncle    Colon cancer Neg Hx    Rectal cancer Neg Hx     Social History Social History   Tobacco Use   Smoking status: Never   Smokeless tobacco: Never  Vaping Use   Vaping Use: Never used  Substance Use Topics   Alcohol use: Yes    Alcohol/week: 0.0 standard drinks of alcohol    Comment: 1 per month   Drug use: No     Allergies   Prozac [fluoxetine hcl], Sulfonamide derivatives, Adhesive  [tape], and Sulfa antibiotics   Review of Systems Review of Systems  Constitutional:  Positive for chills and fatigue.  HENT:  Positive for postnasal drip, rhinorrhea, sinus pressure and sinus pain.   Respiratory:  Positive for cough.   Gastrointestinal:  Positive for vomiting.     Physical Exam Triage Vital Signs ED Triage Vitals  Enc Vitals Group     BP 02/28/22 1530 138/76     Pulse Rate 02/28/22 1530 92     Resp 02/28/22 1530 18     Temp  02/28/22 1530 98.4 F (36.9 C)     Temp Source 02/28/22 1530 Oral     SpO2 02/28/22 1530 95 %     Weight 02/28/22 1531 180 lb (81.6 kg)     Height 02/28/22 1531 5' 6.75" (1.695 m)     Head Circumference --      Peak Flow --      Pain Score 02/28/22 1531 0     Pain Loc --      Pain Edu? --      Excl. in [place]? --    No data found.  Updated Vital Signs BP 138/76 (  BP Location: Left Arm)   Pulse 92   Temp 98.4 F (36.9 C) (Oral)   Resp 18   Ht 5' 6.75" (1.695 m)   Wt 180 lb (81.6 kg)   SpO2 95%   BMI 28.40 kg/m   Visual Acuity Right Eye Distance:   Left Eye Distance:   Bilateral Distance:    Right Eye Near:   Left Eye Near:    Bilateral Near:     Physical Exam Constitutional:      Appearance: Normal appearance.  HENT:     Right Ear: Ear canal normal. Tympanic membrane is injected.     Left Ear: Ear canal normal. Tympanic membrane is injected.     Mouth/Throat:     Mouth: Mucous membranes are moist.     Pharynx: Oropharynx is clear.  Cardiovascular:     Rate and Rhythm: Normal rate and regular rhythm.     Heart sounds: Normal heart sounds.  Pulmonary:     Effort: Pulmonary effort is normal.     Breath sounds: Normal breath sounds and air entry. No wheezing, rhonchi or rales.  Abdominal:     General: Abdomen is flat. Bowel sounds are normal.     Palpations: Abdomen is soft.     Tenderness: There is no guarding or rebound.  Lymphadenopathy:     Cervical: No cervical adenopathy.  Neurological:     Mental Status: She is alert.      UC Treatments / Results  Labs (all labs ordered are listed, but only abnormal results are displayed) Labs Reviewed - No data to display  EKG   Radiology No results found.  Procedures Procedures (including critical care time)  Medications Ordered in UC Medications - No data to display  Initial Impression / Assessment and Plan / UC Course  I have reviewed the triage vital signs and the nursing notes.  Pertinent labs &  imaging results that were available during my care of the patient were reviewed by me and considered in my medical decision making (see chart for details).    Plan: 1.  The nausea and vomiting will be treated with the following: A.  Zofran 4 mg every 6 hours to control the nausea and vomiting. 2.  The upper respiratory tract infection will be treated with the following: A.  Patient advised to use OTC cough preparations to control the congestion. 3.  The acute sinusitis will be treated with the following: A.  Doxycycline 100 mg every 12 hours to treat the sinus infection. 4.  The bronchitis will be treated with the following: A.  Patient advised take OTC cough preparations to control the cough and congestion. 5.  Patient advised follow-up PCP return to urgent care if symptoms fail to improve. Final Clinical Impressions(s) / UC Diagnoses   Final diagnoses:  Acute upper respiratory infection  Acute non-recurrent maxillary sinusitis  Acute bronchitis, unspecified organism  Nausea and vomiting, unspecified vomiting type     Discharge Instructions      Events take the Zofran 4 mg every 6 hours on a regular basis and this will help control the nausea. Advised take doxycycline 100 mg every 12 hours to treat the sinus infection. Advised take ibuprofen or Tylenol as needed for fever or body discomfort. Advised to continue to use congestion medicine OTC to help control cough and congestion. Advised to follow-up PCP or return to urgent care as needed.    ED Prescriptions     Medication Sig Dispense Auth. Provider  ondansetron (ZOFRAN) 4 MG tablet Take 1 tablet (4 mg total) by mouth every 8 (eight) hours as needed for nausea or vomiting. 20 tablet Nyoka Lint, PA-C   doxycycline (VIBRAMYCIN) 100 MG capsule Take 1 capsule (100 mg total) by mouth 2 (two) times daily. 20 capsule Nyoka Lint, PA-C      PDMP not reviewed this encounter.   Nyoka Lint, PA-C 02/28/22 1557

## 2022-02-28 NOTE — Discharge Instructions (Signed)
Events take the Zofran 4 mg every 6 hours on a regular basis and this will help control the nausea. Advised take doxycycline 100 mg every 12 hours to treat the sinus infection. Advised take ibuprofen or Tylenol as needed for fever or body discomfort. Advised to continue to use congestion medicine OTC to help control cough and congestion. Advised to follow-up PCP or return to urgent care as needed.

## 2022-02-28 NOTE — ED Triage Notes (Signed)
Patient c/o vomiting, fatigue, cough, bilateral ear discomfort, sore throat x 1 day.  Patient has taken Mucinex.

## 2022-03-06 ENCOUNTER — Telehealth (INDEPENDENT_AMBULATORY_CARE_PROVIDER_SITE_OTHER): Payer: Medicare Other | Admitting: Family

## 2022-03-06 VITALS — Wt 180.0 lb

## 2022-03-06 DIAGNOSIS — U071 COVID-19: Secondary | ICD-10-CM | POA: Diagnosis not present

## 2022-03-06 MED ORDER — NIRMATRELVIR/RITONAVIR (PAXLOVID)TABLET: 3.0000 | ORAL_TABLET | Freq: Two times a day (BID) | ORAL | 0 refills | Status: AC

## 2022-03-06 NOTE — Progress Notes (Signed)
MyChart Video Visit    Virtual Visit via Video Note   This format is felt to be most appropriate for this patient at this time. Physical exam was limited by quality of the video and audio technology used for the visit. CMA was able to get the patient set up on a video visit.  Patient location: Home. Patient and provider in visit Provider location: Office  I discussed the limitations of evaluation and management by telemedicine and the availability of in person appointments. The patient expressed understanding and agreed to proceed.  Visit Date: 03/06/2022  Today's healthcare provider: Jeanie Sewer, NP     Subjective:   Patient ID: Taylor Hopkins, female    DOB: 24-May-1950, 72 y.o.   MRN: [id number]  Chief Complaint  Patient presents with   Covid Positive    sx for a week    HPI URI sx:  Covid positive on 1/10, Symptoms started last Thursday. Symptoms include nausea, nasal drainage/congestion, sore throat and headaches. Pt states she was given doxycycline on 1/5 for a sinus infection and was not tested at that time, which did not help. States she & husband both have had same sx for same amount of time, both were seen in UC twice and never tested for covid. Pt taking Mucinex with little relief. Pt also reports having covid in 10/22, took Paxlovid, but ended up with long covid sx until 06/2021.  Assessment & Plan:   Problem List Items Addressed This Visit   None Visit Diagnoses     COVID-19    -  Primary Sending Paxlovid, pt advised if possible ineffectiveness due to # of days w/sx, but helped her last time & had no SE. Advised to hold her Lipitor while taking,  on how to take, & SE. Advised of CDC guidelines for masking if out in public. OK to continue taking OTC sinus or pain meds. Encouraged to monitor & notify office of any worsening symptoms: increased shortness of breath, weakness, and signs of dehydration. Instructed to rest and hydrate well.     Relevant  Medications   nirmatrelvir/ritonavir (PAXLOVID) 20 x 150 MG & 10 x '100MG'$  TABS    Past Medical History:  Diagnosis Date   Adenomatous colon polyp 06/2003   Allergic rhinitis    Anxiety    Arthritis    Cataract 2010   bilateral eyes   COVID-19    Depression    Gallstones 07/26/2019   asymtomatic   GERD (gastroesophageal reflux disease)    Sleep apnea    Not currently using CPAP - last sleep study 2016    Past Surgical History:  Procedure Laterality Date   ANKLE SURGERY Right    BREAST ENHANCEMENT SURGERY     COLONOSCOPY     LUMBAR DISC SURGERY     TONSILLECTOMY     TOTAL ABDOMINAL HYSTERECTOMY     UPPER GASTROINTESTINAL ENDOSCOPY      Outpatient Medications Prior to Visit  Medication Sig Dispense Refill   atorvastatin (LIPITOR) 10 MG tablet TAKE 1 TABLET BY MOUTH EVERYDAY AT BEDTIME 90 tablet 3   BIOTIN PO Take 3,000 mcg by mouth.     buPROPion (WELLBUTRIN) 75 MG tablet Take 1 tablet (75 mg total) by mouth 2 (two) times daily. 180 tablet 3   busPIRone (BUSPAR) 7.5 MG tablet Take 1 tablet (7.5 mg total) by mouth 3 (three) times daily as needed. 60 tablet 11   Calcium Citrate-Vitamin D (CALCIUM + D PO) Take by  mouth.     Cyanocobalamin (VITAMIN B12 PO) Take by mouth.     dexlansoprazole (DEXILANT) 60 MG capsule Take 1 capsule (60 mg total) by mouth daily. 90 capsule 1   doxycycline (VIBRAMYCIN) 100 MG capsule Take 1 capsule (100 mg total) by mouth 2 (two) times daily. 20 capsule 0   glucose blood test strip Use as instructed 300 each 3   omeprazole (PRILOSEC) 20 MG capsule TAKE 1 CAPSULE BY MOUTH EVERY DAY 90 capsule 3   ondansetron (ZOFRAN) 4 MG tablet Take 1 tablet (4 mg total) by mouth every 8 (eight) hours as needed for nausea or vomiting. 20 tablet 0   oxybutynin (DITROPAN XL) 10 MG 24 hr tablet Take 1 tablet (10 mg total) by mouth at bedtime. 30 tablet 11   traZODone (DESYREL) 50 MG tablet Take 0.5-1 tablets (25-50 mg total) by mouth at bedtime as needed. for sleep 90  tablet 3   valACYclovir (VALTREX) 1000 MG tablet TAKE 2 TABLETS BY MOUTH 2 (TWO) TIMES DAILY. ONCE, AS NEEDED FOR COLD SORES 20 tablet 2   amoxicillin-clavulanate (AUGMENTIN) 875-125 MG tablet Take 1 tablet by mouth every 12 (twelve) hours. 14 tablet 0   No facility-administered medications prior to visit.    Allergies  Allergen Reactions   Prozac [Fluoxetine Hcl] Itching   Sulfonamide Derivatives    Adhesive  [Tape] Rash   Sulfa Antibiotics Rash       Objective:   Physical Exam Vitals and nursing note reviewed.  Constitutional:      General: Pt is not in acute distress.    Appearance: Normal appearance.  HENT:     Head: Normocephalic.  Pulmonary:     Effort: No respiratory distress.  Musculoskeletal:     Cervical back: Normal range of motion.  Skin:    General: Skin is dry.     Coloration: Skin is not pale.  Neurological:     Mental Status: Pt is alert and oriented to person, place, and time.  Psychiatric:        Mood and Affect: Mood normal.   Wt 180 lb (81.6 kg)   BMI 28.40 kg/m   Wt Readings from Last 3 Encounters:  03/06/22 180 lb (81.6 kg)  02/28/22 180 lb (81.6 kg)  10/16/21 175 lb (79.4 kg)        I discussed the assessment and treatment plan with the patient. The patient was provided an opportunity to ask questions and all were answered. The patient agreed with the plan and demonstrated an understanding of the instructions.   The patient was advised to call back or seek an in-person evaluation if the symptoms worsen or if the condition fails to improve as anticipated.  Jeanie Sewer, NP [place] [phone] (phone) [phone] (fax)  [place]

## 2022-03-06 NOTE — Patient Instructions (Signed)
Hi Taylor Hopkins-  Remember to remind your husband to NOT take his Lipitor (Atorvastatin) while taking the Paxlovid.  I also see Lipitor in your chart for cholesterol, so you should also stop taking this if you are taking currently.  OK for you both to continue taking Tylenol and the Mucinex.  I recommend generic nasal saline spray several times a day to disinfect your sinuses and also can use a humidifier overnight to help with cough and keep sinuses from drying out.  Be sure and drink plenty of fluids!  You also want to wait to get out in public or other family members until you test negative for Covid or be sure to wear your mask for another 5 days.

## 2022-03-14 ENCOUNTER — Encounter: Payer: Self-pay | Admitting: Family Medicine

## 2022-03-14 ENCOUNTER — Ambulatory Visit (INDEPENDENT_AMBULATORY_CARE_PROVIDER_SITE_OTHER): Payer: Medicare Other | Admitting: Family Medicine

## 2022-03-14 VITALS — BP 122/78 | HR 72 | Temp 98.0°F | Ht 66.0 in | Wt 186.1 lb

## 2022-03-14 DIAGNOSIS — J40 Bronchitis, not specified as acute or chronic: Secondary | ICD-10-CM

## 2022-03-14 MED ORDER — GUAIFENESIN-CODEINE 100-10 MG/5ML PO SOLN: 5.0000 mL | Freq: Three times a day (TID) | ORAL | 0 refills | Status: DC | PRN

## 2022-03-14 MED ORDER — AZITHROMYCIN 250 MG PO TABS: ORAL_TABLET | ORAL | 0 refills | Status: AC

## 2022-03-14 NOTE — Progress Notes (Signed)
Subjective:     Patient ID: Taylor Hopkins, female    DOB: 12/26/1950, 72 y.o.   MRN: [id number]  Chief Complaint  Patient presents with   Cough    Deep cough Sx started before Christmas Recent Covid dx   Sneezing   Watery Eyes   Fatigue    Unable to sleep   Nasal Congestion    HPI-here w/husband. Cough since before Christmas.  Sneezing watery eyes, fatigue, congestion.  Doxy on 1/5 and pred, augmentin and pred 12/22. Still not getting better so checked at home and + for Covid and given paxlovid.       Eyes itchy and watery-using polytrim and systane.  No h/o asthma. A lot of sinus congestion, no face pain, coughing still.   Not sleeping but not sure why-not from cough.   When gets up in am, a lot of congestion.  No f/c.  Occ sob.  Requested xanax-advised to message Dr. Jonni Sanger  Health Maintenance Due  Topic Date Due   DTaP/Tdap/Td (1 - Tdap) Never done   Medicare Annual Wellness (AWV)  03/28/2022    Past Medical History:  Diagnosis Date   Adenomatous colon polyp 06/2003   Allergic rhinitis    Anxiety    Arthritis    Cataract 2010   bilateral eyes   COVID-19    Depression    Gallstones 07/26/2019   asymtomatic   GERD (gastroesophageal reflux disease)    Sleep apnea    Not currently using CPAP - last sleep study 2016    Past Surgical History:  Procedure Laterality Date   ANKLE SURGERY Right    BREAST ENHANCEMENT SURGERY     COLONOSCOPY     LUMBAR DISC SURGERY     TONSILLECTOMY     TOTAL ABDOMINAL HYSTERECTOMY     UPPER GASTROINTESTINAL ENDOSCOPY      Outpatient Medications Prior to Visit  Medication Sig Dispense Refill   atorvastatin (LIPITOR) 10 MG tablet TAKE 1 TABLET BY MOUTH EVERYDAY AT BEDTIME 90 tablet 3   BIOTIN PO Take 3,000 mcg by mouth.     buPROPion (WELLBUTRIN) 75 MG tablet Take 1 tablet (75 mg total) by mouth 2 (two) times daily. 180 tablet 3   busPIRone (BUSPAR) 7.5 MG tablet Take 1 tablet (7.5 mg total) by mouth 3 (three) times daily as  needed. 60 tablet 11   dexlansoprazole (DEXILANT) 60 MG capsule Take 1 capsule (60 mg total) by mouth daily. 90 capsule 1   diazepam (VALIUM) 5 MG tablet Take 1 tablet by mouth 90 minutes prior to your procedure and bring the bottle with you     glucose blood test strip Use as instructed 300 each 3   ondansetron (ZOFRAN) 4 MG tablet Take 1 tablet (4 mg total) by mouth every 8 (eight) hours as needed for nausea or vomiting. 20 tablet 0   oxybutynin (DITROPAN XL) 10 MG 24 hr tablet Take 1 tablet (10 mg total) by mouth at bedtime. 30 tablet 11   traZODone (DESYREL) 50 MG tablet Take 0.5-1 tablets (25-50 mg total) by mouth at bedtime as needed. for sleep 90 tablet 3   valACYclovir (VALTREX) 1000 MG tablet TAKE 2 TABLETS BY MOUTH 2 (TWO) TIMES DAILY. ONCE, AS NEEDED FOR COLD SORES 20 tablet 2   ALPRAZolam (XANAX) 0.5 MG tablet TAKE 1 TABLET BY MOUTH EVERY DAY AS NEEDED FOR ANXIETY (Patient not taking: Reported on 03/14/2022)     Calcium Citrate-Vitamin D (CALCIUM + D PO) Take  by mouth. (Patient not taking: Reported on 03/14/2022)     Cyanocobalamin (VITAMIN B12 PO) Take by mouth. (Patient not taking: Reported on 03/14/2022)     doxycycline (VIBRAMYCIN) 100 MG capsule Take 1 capsule (100 mg total) by mouth 2 (two) times daily. (Patient not taking: Reported on 03/14/2022) 20 capsule 0   omeprazole (PRILOSEC) 20 MG capsule TAKE 1 CAPSULE BY MOUTH EVERY DAY 90 capsule 3   No facility-administered medications prior to visit.    Allergies  Allergen Reactions   Fluoxetine Itching   Prozac [Fluoxetine Hcl] Itching   Sulfonamide Derivatives    Adhesive  [Tape] Rash   Other Rash   Sulfa Antibiotics Rash   Wound Dressing Adhesive Rash   ROS neg/noncontributory except as noted HPI/below      Objective:     BP 122/78   Pulse 72   Temp 98 F (36.7 C) (Temporal)   Ht '5\' 6"'$  (1.676 m)   Wt 186 lb 2 oz (84.4 kg)   SpO2 97%   BMI 30.04 kg/m  Wt Readings from Last 3 Encounters:  03/14/22 186 lb 2 oz  (84.4 kg)  03/06/22 180 lb (81.6 kg)  02/28/22 180 lb (81.6 kg)    Physical Exam   Gen: WDWN NAD HEENT: NCAT, conjunctiva not injected, sclera nonicteric TM WNL B, OP moist, no exudates.  Sinuses NT NECK:  supple, no thyromegaly, no nodes, no carotid bruits CARDIAC: RRR, S1S2+, no murmur.  LUNGS: CTAB. No wheezes EXT:  no edema MSK: no gross abnormalities.  NEURO: A&O x3.  CN II-XII intact.  PSYCH: normal mood. Good eye contact  Pdmp checked.  Reviewed records from [place]:   Problem List Items Addressed This Visit   None Visit Diagnoses     Bronchitis    -  Primary     1.  Bronchitis-may be postinfectious.  Patient has already been on Augmentin and doxycycline and Paxlovid and 2 courses of prednisone.  I want to hold off on any more antibiotics, but patient very concerned and strongly requested.  Declined chest x-ray.  Will do Z-Pak.  I do not think further steroids are warranted.  Patient also requested codeine cough syrup-PDMP checked and this was prescribed.  If worse, not improving, follow-up  Meds ordered this encounter  Medications   azithromycin (ZITHROMAX) 250 MG tablet    Sig: Take 2 tablets on day 1, then 1 tablet daily on days 2 through 5    Dispense:  6 tablet    Refill:  0   guaiFENesin-codeine 100-10 MG/5ML syrup    Sig: Take 5 mLs by mouth 3 (three) times daily as needed for cough.    Dispense:  120 mL    Refill:  0    Wellington Hampshire, MD

## 2022-03-14 NOTE — Patient Instructions (Addendum)
Will send zpk and codeine cough syrup  Worse, no change, let us know  Pataday/patanol.  Or some type of allergy eye drops

## 2022-03-19 ENCOUNTER — Telehealth: Payer: Self-pay | Admitting: Family Medicine

## 2022-03-19 DIAGNOSIS — E785 Hyperlipidemia, unspecified: Secondary | ICD-10-CM

## 2022-03-19 DIAGNOSIS — R1013 Epigastric pain: Secondary | ICD-10-CM

## 2022-03-19 DIAGNOSIS — K2100 Gastro-esophageal reflux disease with esophagitis, without bleeding: Secondary | ICD-10-CM

## 2022-03-19 DIAGNOSIS — F5101 Primary insomnia: Secondary | ICD-10-CM

## 2022-03-19 NOTE — Telephone Encounter (Signed)
Patient states: -[facility] informed her they sent a refill request for 5 medications on 03/11/22. - Medications were wellbutrin xl, atorvastatin, dexilant, buspar, and trazodone.  - In letter it stated quantity was a 90 day supply and needed to be sent to [facility]

## 2022-03-20 MED ORDER — DEXLANSOPRAZOLE 60 MG PO CPDR: 60.0000 mg | DELAYED_RELEASE_CAPSULE | Freq: Every day | ORAL | 3 refills | Status: DC

## 2022-03-20 MED ORDER — ATORVASTATIN CALCIUM 10 MG PO TABS: 10.0000 mg | ORAL_TABLET | Freq: Every evening | ORAL | 3 refills | Status: DC

## 2022-03-20 MED ORDER — TRAZODONE HCL 50 MG PO TABS: 25.0000 mg | ORAL_TABLET | Freq: Every evening | ORAL | 3 refills | Status: DC | PRN

## 2022-03-20 MED ORDER — BUSPIRONE HCL 7.5 MG PO TABS: 7.5000 mg | ORAL_TABLET | Freq: Three times a day (TID) | ORAL | 3 refills | Status: DC | PRN

## 2022-03-20 NOTE — Telephone Encounter (Signed)
Please call pt: I did not receive the request last week; however, I have sent her requested refills to [facility] today.  thanks

## 2022-03-20 NOTE — Telephone Encounter (Signed)
Spoke to pt and informed her that Rx were sent

## 2022-04-10 ENCOUNTER — Ambulatory Visit: Payer: Medicare Other

## 2022-04-14 ENCOUNTER — Ambulatory Visit (INDEPENDENT_AMBULATORY_CARE_PROVIDER_SITE_OTHER): Payer: Medicare Other

## 2022-04-14 VITALS — Wt 184.0 lb

## 2022-04-14 DIAGNOSIS — Z0000 Encounter for general adult medical examination without abnormal findings: Secondary | ICD-10-CM | POA: Diagnosis not present

## 2022-04-14 NOTE — Progress Notes (Signed)
I connected with  Takala Giauque Herrmann on 04/14/22 by a audio enabled telemedicine application and verified that I am speaking with the correct person using two identifiers.  Patient Location: Home  Provider Location: Office/Clinic  I discussed the limitations of evaluation and management by telemedicine. The patient expressed understanding and agreed to proceed.   Subjective:   Taylor Hopkins is a 72 y.o. female who presents for Medicare Annual (Subsequent) preventive examination.  Review of Systems     Cardiac Risk Factors include: advanced age (>34mn, >>18women);hypertension;dyslipidemia     Objective:    Today's Vitals   04/14/22 1051  Weight: 184 lb (83.5 kg)   Body mass index is 29.7 kg/m.     04/14/2022   10:58 AM 03/28/2021    8:53 AM 01/31/2019   11:16 AM 11/19/2018    3:54 PM 10/18/2018   10:22 AM 07/16/2016    7:19 AM  Advanced Directives  Does Patient Have a Medical Advance Directive? No Yes Yes Yes Yes Yes  Type of AArmed forces logistics/support/administrative officerPower of [place] Does patient want to make changes to medical advance directive?   No - Patient declined No - Patient declined No - Patient declined   Copy of [place] Chart?  No - copy requested Yes - validated most recent copy scanned in chart (See row information) No - copy requested  No - copy requested  Would patient like information on creating a medical advance directive? No - Patient declined         Current Medications (verified) Outpatient Encounter Medications as of 04/14/2022  Medication Sig   atorvastatin (LIPITOR) 10 MG tablet Take 1 tablet (10 mg total) by mouth at bedtime.   buPROPion (WELLBUTRIN) 75 MG tablet Take 1 tablet (75 mg total) by mouth 2 (two) times daily.   busPIRone (BUSPAR) 7.5 MG tablet Take 1 tablet (7.5 mg total) by mouth 3 (three) times daily as  needed.   dexlansoprazole (DEXILANT) 60 MG capsule Take 1 capsule (60 mg total) by mouth daily.   glucose blood test strip Use as instructed   ondansetron (ZOFRAN) 4 MG tablet Take 1 tablet (4 mg total) by mouth every 8 (eight) hours as needed for nausea or vomiting.   traZODone (DESYREL) 50 MG tablet Take 0.5-1 tablets (25-50 mg total) by mouth at bedtime as needed. for sleep   ALPRAZolam (XANAX) 0.5 MG tablet TAKE 1 TABLET BY MOUTH EVERY DAY AS NEEDED FOR ANXIETY (Patient not taking: Reported on 04/14/2022)   BIOTIN PO Take 3,000 mcg by mouth. (Patient not taking: Reported on 04/14/2022)   diazepam (VALIUM) 5 MG tablet Take 1 tablet by mouth 90 minutes prior to your procedure and bring the bottle with you (Patient not taking: Reported on 04/14/2022)   oxybutynin (DITROPAN XL) 10 MG 24 hr tablet Take 1 tablet (10 mg total) by mouth at bedtime. (Patient not taking: Reported on 04/14/2022)   valACYclovir (VALTREX) 1000 MG tablet TAKE 2 TABLETS BY MOUTH 2 (TWO) TIMES DAILY. ONCE, AS NEEDED FOR COLD SORES (Patient not taking: Reported on 04/14/2022)   [DISCONTINUED] guaiFENesin-codeine 100-10 MG/5ML syrup Take 5 mLs by mouth 3 (three) times daily as needed for cough.   No facility-administered encounter medications on file as of 04/14/2022.    Allergies (verified) Fluoxetine, Prozac [fluoxetine hcl], Sulfonamide derivatives, Adhesive  [tape], Other, Sulfa antibiotics, and Wound dressing  adhesive   History: Past Medical History:  Diagnosis Date   Adenomatous colon polyp 06/2003   Allergic rhinitis    Anxiety    Arthritis    Cataract 2010   bilateral eyes   COVID-19    Depression    Gallstones 07/26/2019   asymtomatic   GERD (gastroesophageal reflux disease)    Sleep apnea    Not currently using CPAP - last sleep study 2016   Past Surgical History:  Procedure Laterality Date   ANKLE SURGERY Right    BREAST ENHANCEMENT SURGERY     COLONOSCOPY     LUMBAR DISC SURGERY     TONSILLECTOMY      TOTAL ABDOMINAL HYSTERECTOMY     UPPER GASTROINTESTINAL ENDOSCOPY     Family History  Problem Relation Age of Onset   Allergies Brother    Heart attack Father    Colon polyps Father    Diverticulitis Father    Heart failure Father    Esophageal cancer Maternal Grandmother    Heart failure Mother    Mitral valve prolapse Mother    Diabetes Mother    Kidney failure Mother    Stomach cancer Paternal Uncle    Colon cancer Neg Hx    Rectal cancer Neg Hx    Social History   Socioeconomic History   Marital status: Married    Spouse name: Coralyn Mark   Number of children: 2   Years of education: Not on file   Highest education level: Not on file  Occupational History    Employer: RETIRED    Comment: Engineer  Tobacco Use   Smoking status: Never   Smokeless tobacco: Never  Vaping Use   Vaping Use: Never used  Substance and Sexual Activity   Alcohol use: Yes    Alcohol/week: 0.0 standard drinks of alcohol    Comment: 1 per month   Drug use: No   Sexual activity: Yes    Birth control/protection: Post-menopausal  Other Topics Concern   Not on file  Social History Narrative   Not on file   Social Determinants of Health   Financial Resource Strain: Low Risk  (04/14/2022)   Overall Financial Resource Strain (CARDIA)    Difficulty of Paying Living Expenses: Not hard at all  Food Insecurity: No Food Insecurity (04/14/2022)   Hunger Vital Sign    Worried About Running Out of Food in the Last Year: Never true    Ran Out of Food in the Last Year: Never true  Transportation Needs: No Transportation Needs (04/14/2022)   PRAPARE - Hydrologist (Medical): No    Lack of Transportation (Non-Medical): No  Physical Activity: Inactive (04/14/2022)   Exercise Vital Sign    Days of Exercise per Week: 0 days    Minutes of Exercise per Session: 0 min  Stress: Stress Concern Present (04/14/2022)   Progress    Feeling of Stress : To some extent  Social Connections: Moderately Integrated (04/14/2022)   Social Connection and Isolation Panel [NHANES]    Frequency of Communication with Friends and Family: More than three times a week    Frequency of Social Gatherings with Friends and Family: More than three times a week    Attends Religious Services: More than 4 times per year    Active Member of [employer] or Organizations: No    Attends Archivist Meetings: Never    Marital Status: Married  Tobacco Counseling Counseling given: Not Answered   Clinical Intake:  Pre-visit preparation completed: Yes  Pain : No/denies pain     BMI - recorded: 29.7 Nutritional Status: BMI 25 -29 Overweight Nutritional Risks: None Diabetes: No  How often do you need to have someone help you when you read instructions, pamphlets, or other written materials from your doctor or pharmacy?: 1 - Never  Diabetic?no  Interpreter Needed?: No  Information entered by :: Charlott Rakes, LPN   Activities of Daily Living    04/14/2022   11:00 AM  In your present state of health, do you have any difficulty performing the following activities:  Hearing? 0  Vision? 0  Difficulty concentrating or making decisions? 0  Walking or climbing stairs? 0  Dressing or bathing? 0  Doing errands, shopping? 0  Preparing Food and eating ? N  Using the Toilet? N  In the past six months, have you accidently leaked urine? Y  Comment wears a pad  Do you have problems with loss of bowel control? N  Managing your Medications? N  Managing your Finances? N  Housekeeping or managing your Housekeeping? N    Patient Care Team: Leamon Arnt, MD as PCP - General (Family Medicine) Lady Gary, [facility] as Consulting Physician (Gynecology) Berle Mull, MD as Consulting Physician (Sports Medicine) Ladene Artist, MD as Consulting Physician (Gastroenterology) Magnus Sinning, MD as  Consulting Physician (Physical Medicine and Rehabilitation)  Indicate any recent Medical Services you may have received from other than [facility] providers in the past year (date may be approximate).     Assessment:   This is a routine wellness examination for [place].  Hearing/Vision screen Hearing Screening - Comments:: Pt stated slight loss  Vision Screening - Comments:: Pt follows up with [place] [facility] for annual eye exams   Dietary issues and exercise activities discussed: Current Exercise Habits: The patient does not participate in regular exercise at present   Goals Addressed             This Visit's Progress    Patient Stated       Lose weight        Depression Screen    04/14/2022   10:56 AM 03/06/2022   11:19 AM 03/28/2021    8:50 AM 03/28/2020    7:59 AM 02/20/2020   10:30 AM 07/26/2019   10:35 AM 03/28/2019    2:02 PM  PHQ 2/9 Scores  PHQ - 2 Score 1 0 2 2 4 2 4  $ PHQ- 9 Score 2 0 5 4 19 9 14    $ Fall Risk    04/14/2022   10:59 AM 03/14/2022    1:09 PM 07/04/2021   10:10 AM 03/28/2021    8:55 AM 07/26/2019   10:35 AM  Fall Risk   Falls in the past year? 1 1 0 0 0  Number falls in past yr: 1 0 0 0 0  Injury with Fall? 1 1 0 0 0  Comment fractured right ankle 08/27/21      Risk for fall due to : Impaired vision Impaired balance/gait No Fall Risks Impaired vision   Follow up Falls prevention discussed Education provided;Falls prevention discussed Falls evaluation completed Falls prevention discussed     FALL RISK PREVENTION PERTAINING TO THE HOME:  Any stairs in or around the home? Yes  If so, are there any without handrails? No  Home free of loose throw rugs in walkways, pet beds, electrical cords, etc? Yes  Adequate lighting in your home to reduce risk of falls? Yes   ASSISTIVE DEVICES UTILIZED TO PREVENT FALLS:  Life alert? No  Use of a cane, walker or w/c? No  Grab bars in the bathroom? No  Shower chair or bench in shower? No  Elevated toilet seat or a  handicapped toilet? No   TIMED UP AND GO:  Was the test performed? No .   Cognitive Function:        04/14/2022   11:00 AM 03/28/2021    8:57 AM  6CIT Screen  What Year? 0 points 0 points  What month? 0 points 0 points  What time? 0 points 0 points  Count back from 20 0 points 0 points  Months in reverse 0 points 0 points  Repeat phrase 0 points 0 points  Total Score 0 points 0 points    Immunizations Immunization History  Administered Date(s) Administered   Fluad Quad(high Dose 65+) 11/19/2018   PFIZER(Purple Top)SARS-COV-2 Vaccination 06/03/2019, 06/24/2019, 03/24/2020   Pneumococcal Conjugate-13 03/28/2020   Pneumococcal Polysaccharide-23 11/19/2018    TDAP status: Due, Education has been provided regarding the importance of this vaccine. Advised may receive this vaccine at local pharmacy or Health Dept. Aware to provide a copy of the vaccination record if obtained from local pharmacy or Health Dept. Verbalized acceptance and understanding.  Flu Vaccine status: Due, Education has been provided regarding the importance of this vaccine. Advised may receive this vaccine at local pharmacy or Health Dept. Aware to provide a copy of the vaccination record if obtained from local pharmacy or Health Dept. Verbalized acceptance and understanding.  Pneumococcal vaccine status: Up to date  Covid-19 vaccine status: Completed vaccines  Qualifies for Shingles Vaccine? Yes   Zostavax completed No   Shingrix Completed?: No.    Education has been provided regarding the importance of this vaccine. Patient has been advised to call insurance company to determine out of pocket expense if they have not yet received this vaccine. Advised may also receive vaccine at local pharmacy or Health Dept. Verbalized acceptance and understanding.  Screening Tests Health Maintenance  Topic Date Due   DTaP/Tdap/Td (1 - Tdap) Never done   COVID-19 Vaccine (4 - 2023-24 season) 04/27/2022 (Originally  10/25/2021)   Zoster Vaccines- Shingrix (1 of 2) 05/07/2022 (Originally 10/02/1969)   INFLUENZA VACCINE  05/25/2022 (Originally 09/24/2021)   COLONOSCOPY (Pts 45-60yr Insurance coverage will need to be confirmed)  10/17/2022 (Originally 07/16/2021)   DEXA SCAN  10/13/2022   MAMMOGRAM  11/14/2022   Medicare Annual Wellness (AWV)  04/15/2023   Pneumonia Vaccine 72 Years old  Completed   Hepatitis C Screening  Completed   HPV VACCINES  Aged Out    Health Maintenance  Health Maintenance Due  Topic Date Due   DTaP/Tdap/Td (1 - Tdap) Never done    Colorectal cancer screening: Type of screening: Colonoscopy. Completed 07/16/16. Repeat every 5 years  Mammogram status: Completed 11/13/21. Repeat every year  Bone Density status: Completed 10/12/20. Results reflect: Bone density results: OSTEOPOROSIS. Repeat every 2 years.   Additional Screening:  Hepatitis C Screening:  Completed 07/05/18  Vision Screening: Recommended annual ophthalmology exams for early detection of glaucoma and other disorders of the eye. Is the patient up to date with their annual eye exam?  Yes  Who is the provider or what is the name of the office in which the patient attends annual eye exams? [facility]  If pt is not established with a provider, would they like  to be referred to a provider to establish care? No .   Dental Screening: Recommended annual dental exams for proper oral hygiene  Community Resource Referral / Chronic Care Management: CRR required this visit?  No   CCM required this visit?  No      Plan:     I have personally reviewed and noted the following in the patient's chart:   Medical and social history Use of alcohol, tobacco or illicit drugs  Current medications and supplements including opioid prescriptions. Patient is not currently taking opioid prescriptions. Functional ability and status Nutritional status Physical activity Advanced directives List of other physicians Hospitalizations,  surgeries, and ER visits in previous 12 months Vitals Screenings to include cognitive, depression, and falls Referrals and appointments  In addition, I have reviewed and discussed with patient certain preventive protocols, quality metrics, and best practice recommendations. A written personalized care plan for preventive services as well as general preventive health recommendations were provided to patient.     Willette Brace, LPN   [id number]   Nurse Notes: none

## 2022-04-14 NOTE — Patient Instructions (Signed)
[name] , Thank you for taking time to come for your Medicare Wellness Visit. I appreciate your ongoing commitment to your health goals. Please review the following plan we discussed and let me know if I can assist you in the future.   These are the goals we discussed:  Goals      Patient Stated     Lose weight      Patient Stated     Lose weight         This is a list of the screening recommended for you and due dates:  Health Maintenance  Topic Date Due   DTaP/Tdap/Td vaccine (1 - Tdap) Never done   COVID-19 Vaccine (4 - 2023-24 season) 04/27/2022*   Zoster (Shingles) Vaccine (1 of 2) 05/07/2022*   Flu Shot  05/25/2022*   Colon Cancer Screening  10/17/2022*   DEXA scan (bone density measurement)  10/13/2022   Mammogram  11/14/2022   Medicare Annual Wellness Visit  04/15/2023   Pneumonia Vaccine  Completed   Hepatitis C Screening: USPSTF Recommendation to screen - Ages 18-79 yo.  Completed   HPV Vaccine  Aged Out  *Topic was postponed. The date shown is not the original due date.    Advanced directives: Please bring a copy of your health care power of attorney and living will to the office at your convenience.   Conditions/risks identified: lose weight   Next appointment: Follow up in one year for your annual wellness visit    Preventive Care 65 Years and Older, Female Preventive care refers to lifestyle choices and visits with your health care provider that can promote health and wellness. What does preventive care include? A yearly physical exam. This is also called an annual well check. Dental exams once or twice a year. Routine eye exams. Ask your health care provider how often you should have your eyes checked. Personal lifestyle choices, including: Daily care of your teeth and gums. Regular physical activity. Eating a healthy diet. Avoiding tobacco and drug use. Limiting alcohol use. Practicing safe sex. Taking low-dose aspirin every day. Taking  vitamin and mineral supplements as recommended by your health care provider. What happens during an annual well check? The services and screenings done by your health care provider during your annual well check will depend on your age, overall health, lifestyle risk factors, and family history of disease. Counseling  Your health care provider may ask you questions about your: Alcohol use. Tobacco use. Drug use. Emotional well-being. Home and relationship well-being. Sexual activity. Eating habits. History of falls. Memory and ability to understand (cognition). Work and work Statistician. Reproductive health. Screening  You may have the following tests or measurements: Height, weight, and BMI. Blood pressure. Lipid and cholesterol levels. These may be checked every 5 years, or more frequently if you are over 63 years old. Skin check. Lung cancer screening. You may have this screening every year starting at age 73 if you have a 30-pack-year history of smoking and currently smoke or have quit within the past 15 years. Fecal occult blood test (FOBT) of the stool. You may have this test every year starting at age 57. Flexible sigmoidoscopy or colonoscopy. You may have a sigmoidoscopy every 5 years or a colonoscopy every 10 years starting at age 84. Hepatitis C blood test. Hepatitis B blood test. Sexually transmitted disease (STD) testing. Diabetes screening. This is done by checking your blood sugar (glucose) after you have not eaten for a while (fasting). You may have  this done every 1-3 years. Bone density scan. This is done to screen for osteoporosis. You may have this done starting at age 35. Mammogram. This may be done every 1-2 years. Talk to your health care provider about how often you should have regular mammograms. Talk with your health care provider about your test results, treatment options, and if necessary, the need for more tests. Vaccines  Your health care provider may  recommend certain vaccines, such as: Influenza vaccine. This is recommended every year. Tetanus, diphtheria, and acellular pertussis (Tdap, Td) vaccine. You may need a Td booster every 10 years. Zoster vaccine. You may need this after age 37. Pneumococcal 13-valent conjugate (PCV13) vaccine. One dose is recommended after age 53. Pneumococcal polysaccharide (PPSV23) vaccine. One dose is recommended after age 35. Talk to your health care provider about which screenings and vaccines you need and how often you need them. This information is not intended to replace advice given to you by your health care provider. Make sure you discuss any questions you have with your health care provider. Document Released: 03/09/2015 Document Revised: 10/31/2015 Document Reviewed: 12/12/2014 Elsevier Interactive Patient Education  2017 [employer] Prevention in the Home Falls can cause injuries. They can happen to people of all ages. There are many things you can do to make your home safe and to help prevent falls. What can I do on the outside of my home? Regularly fix the edges of walkways and driveways and fix any cracks. Remove anything that might make you trip as you walk through a door, such as a raised step or threshold. Trim any bushes or trees on the path to your home. Use bright outdoor lighting. Clear any walking paths of anything that might make someone trip, such as rocks or tools. Regularly check to see if handrails are loose or broken. Make sure that both sides of any steps have handrails. Any raised decks and porches should have guardrails on the edges. Have any leaves, snow, or ice cleared regularly. Use sand or salt on walking paths during winter. Clean up any spills in your garage right away. This includes oil or grease spills. What can I do in the bathroom? Use night lights. Install grab bars by the toilet and in the tub and shower. Do not use towel bars as grab bars. Use non-skid  mats or decals in the tub or shower. If you need to sit down in the shower, use a plastic, non-slip stool. Keep the floor dry. Clean up any water that spills on the floor as soon as it happens. Remove soap buildup in the tub or shower regularly. Attach bath mats securely with double-sided non-slip rug tape. Do not have throw rugs and other things on the floor that can make you trip. What can I do in the bedroom? Use night lights. Make sure that you have a light by your bed that is easy to reach. Do not use any sheets or blankets that are too big for your bed. They should not hang down onto the floor. Have a firm chair that has side arms. You can use this for support while you get dressed. Do not have throw rugs and other things on the floor that can make you trip. What can I do in the kitchen? Clean up any spills right away. Avoid walking on wet floors. Keep items that you use a lot in easy-to-reach places. If you need to reach something above you, use a strong step stool  that has a grab bar. Keep electrical cords out of the way. Do not use floor polish or wax that makes floors slippery. If you must use wax, use non-skid floor wax. Do not have throw rugs and other things on the floor that can make you trip. What can I do with my stairs? Do not leave any items on the stairs. Make sure that there are handrails on both sides of the stairs and use them. Fix handrails that are broken or loose. Make sure that handrails are as long as the stairways. Check any carpeting to make sure that it is firmly attached to the stairs. Fix any carpet that is loose or worn. Avoid having throw rugs at the top or bottom of the stairs. If you do have throw rugs, attach them to the floor with carpet tape. Make sure that you have a light switch at the top of the stairs and the bottom of the stairs. If you do not have them, ask someone to add them for you. What else can I do to help prevent falls? Wear shoes  that: Do not have high heels. Have rubber bottoms. Are comfortable and fit you well. Are closed at the toe. Do not wear sandals. If you use a stepladder: Make sure that it is fully opened. Do not climb a closed stepladder. Make sure that both sides of the stepladder are locked into place. Ask someone to hold it for you, if possible. Clearly mark and make sure that you can see: Any grab bars or handrails. First and last steps. Where the edge of each step is. Use tools that help you move around (mobility aids) if they are needed. These include: Canes. Walkers. Scooters. Crutches. Turn on the lights when you go into a dark area. Replace any light bulbs as soon as they burn out. Set up your furniture so you have a clear path. Avoid moving your furniture around. If any of your floors are uneven, fix them. If there are any pets around you, be aware of where they are. Review your medicines with your doctor. Some medicines can make you feel dizzy. This can increase your chance of falling. Ask your doctor what other things that you can do to help prevent falls. This information is not intended to replace advice given to you by your health care provider. Make sure you discuss any questions you have with your health care provider. Document Released: 12/07/2008 Document Revised: 07/19/2015 Document Reviewed: 03/17/2014 Elsevier Interactive Patient Education  2017 [employer].

## 2022-04-21 ENCOUNTER — Telehealth: Payer: Self-pay | Admitting: Family Medicine

## 2022-04-21 ENCOUNTER — Other Ambulatory Visit: Payer: Self-pay

## 2022-04-21 DIAGNOSIS — R1013 Epigastric pain: Secondary | ICD-10-CM

## 2022-04-21 DIAGNOSIS — K2100 Gastro-esophageal reflux disease with esophagitis, without bleeding: Secondary | ICD-10-CM

## 2022-04-21 MED ORDER — DEXLANSOPRAZOLE 60 MG PO CPDR: 60.0000 mg | DELAYED_RELEASE_CAPSULE | Freq: Every day | ORAL | 3 refills | Status: DC

## 2022-04-21 NOTE — Telephone Encounter (Signed)
Rx sent 

## 2022-04-21 NOTE — Telephone Encounter (Signed)
Patient states she is no longer using [facility] due to change in insurance.  Patient requests RX and refills for dexlansoprazole (DEXILANT) 60 MG capsule  be sent to  [place], [place] Phone: [phone]  Fax: [phone]      Patient states [facility] told Patient that Dr. Jonni Sanger would need to confirm with [facility] that Patient had taken other medications that do not work and needs the above medication for a 90 day supply.

## 2022-04-23 ENCOUNTER — Telehealth: Payer: Self-pay

## 2022-04-23 NOTE — Telephone Encounter (Signed)
PA for dexilant has been sent to plan. Waiting on approval/denial  Key: [facility]  Request Reference Number: [id number]. DEXILANT CAP '60MG'$  DR is approved through 02/24/2023.

## 2022-06-12 ENCOUNTER — Telehealth: Payer: Self-pay | Admitting: Family Medicine

## 2022-06-12 ENCOUNTER — Other Ambulatory Visit: Payer: Self-pay

## 2022-06-12 DIAGNOSIS — R1013 Epigastric pain: Secondary | ICD-10-CM

## 2022-06-12 DIAGNOSIS — K2100 Gastro-esophageal reflux disease with esophagitis, without bleeding: Secondary | ICD-10-CM

## 2022-06-12 MED ORDER — BUPROPION HCL 75 MG PO TABS: 75.0000 mg | ORAL_TABLET | Freq: Two times a day (BID) | ORAL | 3 refills | Status: DC

## 2022-06-12 MED ORDER — DEXLANSOPRAZOLE 60 MG PO CPDR: 60.0000 mg | DELAYED_RELEASE_CAPSULE | Freq: Every day | ORAL | 3 refills | Status: DC

## 2022-06-12 NOTE — Telephone Encounter (Signed)
Prescription Request  06/12/2022  LOV: 07/04/2021  What is the name of the medication or equipment?  buPROPion (WELLBUTRIN) 75 MG tablet   dexlansoprazole (DEXILANT) 60 MG capsule   Have you contacted your pharmacy to request a refill? Yes   Which pharmacy would you like this sent to?  [facility] - [place], [place] - [address] [place] [address] Phone: [phone] Fax: [phone]   Patient notified that their request is being sent to the clinical staff for review and that they should receive a response within 2 business days.   Please advise at [facility] [phone]

## 2022-06-13 ENCOUNTER — Telehealth: Payer: Self-pay

## 2022-06-13 NOTE — Telephone Encounter (Signed)
Request Reference Number: [id number]. DEXILANT CAP  DR is approved through 02/24/2023.

## 2022-06-13 NOTE — Telephone Encounter (Signed)
Rx sent 

## 2022-07-07 ENCOUNTER — Ambulatory Visit: Payer: Medicare Other | Admitting: Family Medicine

## 2022-07-11 ENCOUNTER — Ambulatory Visit: Payer: Medicare Other | Admitting: Family Medicine

## 2022-07-24 ENCOUNTER — Ambulatory Visit: Payer: Medicare Other | Admitting: Family Medicine

## 2022-07-28 ENCOUNTER — Encounter: Payer: Self-pay | Admitting: Nurse Practitioner

## 2022-08-06 ENCOUNTER — Other Ambulatory Visit: Payer: Self-pay | Admitting: Family Medicine

## 2022-08-08 ENCOUNTER — Other Ambulatory Visit: Payer: Self-pay | Admitting: Family Medicine

## 2022-08-11 ENCOUNTER — Ambulatory Visit (INDEPENDENT_AMBULATORY_CARE_PROVIDER_SITE_OTHER): Payer: Medicare Other | Admitting: Family Medicine

## 2022-08-11 ENCOUNTER — Encounter: Payer: Self-pay | Admitting: Family Medicine

## 2022-08-11 VITALS — BP 138/80 | HR 78 | Temp 98.2°F | Ht 66.0 in | Wt 189.4 lb

## 2022-08-11 DIAGNOSIS — M810 Age-related osteoporosis without current pathological fracture: Secondary | ICD-10-CM

## 2022-08-11 DIAGNOSIS — Z780 Asymptomatic menopausal state: Secondary | ICD-10-CM

## 2022-08-11 DIAGNOSIS — G8929 Other chronic pain: Secondary | ICD-10-CM

## 2022-08-11 DIAGNOSIS — N3946 Mixed incontinence: Secondary | ICD-10-CM

## 2022-08-11 DIAGNOSIS — M5416 Radiculopathy, lumbar region: Secondary | ICD-10-CM

## 2022-08-11 DIAGNOSIS — K2100 Gastro-esophageal reflux disease with esophagitis, without bleeding: Secondary | ICD-10-CM | POA: Diagnosis not present

## 2022-08-11 DIAGNOSIS — E559 Vitamin D deficiency, unspecified: Secondary | ICD-10-CM | POA: Diagnosis not present

## 2022-08-11 DIAGNOSIS — F339 Major depressive disorder, recurrent, unspecified: Secondary | ICD-10-CM

## 2022-08-11 DIAGNOSIS — R7303 Prediabetes: Secondary | ICD-10-CM

## 2022-08-11 DIAGNOSIS — F5104 Psychophysiologic insomnia: Secondary | ICD-10-CM

## 2022-08-11 DIAGNOSIS — K8020 Calculus of gallbladder without cholecystitis without obstruction: Secondary | ICD-10-CM

## 2022-08-11 DIAGNOSIS — Z0000 Encounter for general adult medical examination without abnormal findings: Secondary | ICD-10-CM

## 2022-08-11 DIAGNOSIS — G4733 Obstructive sleep apnea (adult) (pediatric): Secondary | ICD-10-CM

## 2022-08-11 DIAGNOSIS — M542 Cervicalgia: Secondary | ICD-10-CM | POA: Diagnosis not present

## 2022-08-11 DIAGNOSIS — R202 Paresthesia of skin: Secondary | ICD-10-CM | POA: Insufficient documentation

## 2022-08-11 LAB — CBC WITH DIFFERENTIAL/PLATELET
Basophils Absolute: 0 10*3/uL (ref 0.0–0.1)
Basophils Relative: 0.5 % (ref 0.0–3.0)
Eosinophils Absolute: 0.1 10*3/uL (ref 0.0–0.7)
Eosinophils Relative: 1.4 % (ref 0.0–5.0)
HCT: 40.7 % (ref 36.0–46.0)
Hemoglobin: 13.6 g/dL (ref 12.0–15.0)
Lymphocytes Relative: 27.1 % (ref 12.0–46.0)
Lymphs Abs: 1.7 10*3/uL (ref 0.7–4.0)
MCHC: 33.3 g/dL (ref 30.0–36.0)
MCV: 97.7 fl (ref 78.0–100.0)
Monocytes Absolute: 0.6 10*3/uL (ref 0.1–1.0)
Monocytes Relative: 9.2 % (ref 3.0–12.0)
Neutro Abs: 3.9 10*3/uL (ref 1.4–7.7)
Neutrophils Relative %: 61.8 % (ref 43.0–77.0)
Platelets: 162 10*3/uL (ref 150.0–400.0)
RBC: 4.17 Mil/uL (ref 3.87–5.11)
RDW: 13.4 % (ref 11.5–15.5)
WBC: 6.3 10*3/uL (ref 4.0–10.5)

## 2022-08-11 LAB — VITAMIN D 25 HYDROXY (VIT D DEFICIENCY, FRACTURES): VITD: 17.67 ng/mL — ABNORMAL LOW (ref 30.00–100.00)

## 2022-08-11 LAB — LIPID PANEL
Cholesterol: 227 mg/dL — ABNORMAL HIGH (ref 0–200)
HDL: 30.8 mg/dL — ABNORMAL LOW (ref >39.00)
Total CHOL/HDL Ratio: 7
Triglycerides: 874 mg/dL — ABNORMAL HIGH (ref 0.0–149.0)

## 2022-08-11 LAB — COMPREHENSIVE METABOLIC PANEL
ALT: 15 U/L (ref 0–35)
AST: 18 U/L (ref 0–37)
Albumin: 3.9 g/dL (ref 3.5–5.2)
Alkaline Phosphatase: 40 U/L (ref 39–117)
BUN: 13 mg/dL (ref 6–23)
CO2: 29 mEq/L (ref 19–32)
Calcium: 8.8 mg/dL (ref 8.4–10.5)
Chloride: 102 mEq/L (ref 96–112)
Creatinine, Ser: 0.81 mg/dL (ref 0.40–1.20)
GFR: 72.3 mL/min (ref >60.00)
Glucose, Bld: 113 mg/dL — ABNORMAL HIGH (ref 70–99)
Potassium: 3.3 mEq/L — ABNORMAL LOW (ref 3.5–5.1)
Sodium: 140 mEq/L (ref 135–145)
Total Bilirubin: 0.5 mg/dL (ref 0.2–1.2)
Total Protein: 6.6 g/dL (ref 6.0–8.3)

## 2022-08-11 LAB — VITAMIN B12: Vitamin B-12: >1500 pg/mL — ABNORMAL HIGH (ref 211–911)

## 2022-08-11 LAB — LDL CHOLESTEROL, DIRECT: Direct LDL: 56 mg/dL

## 2022-08-11 LAB — TSH: TSH: 1.73 u[IU]/mL (ref 0.35–5.50)

## 2022-08-11 LAB — HEMOGLOBIN A1C: Hgb A1c MFr Bld: 5.9 % (ref 4.6–6.5)

## 2022-08-11 MED ORDER — SHINGRIX 50 MCG/0.5ML IM SUSR: 0.5000 mL | Freq: Once | INTRAMUSCULAR | 0 refills | Status: AC

## 2022-08-11 MED ORDER — DENOSUMAB 60 MG/ML ~~LOC~~ SOSY: 60.0000 mg | PREFILLED_SYRINGE | Freq: Once | SUBCUTANEOUS | 0 refills | Status: DC

## 2022-08-11 MED ORDER — DENOSUMAB 60 MG/ML ~~LOC~~ SOSY
60.0000 mg | PREFILLED_SYRINGE | Freq: Once | SUBCUTANEOUS | Status: AC
  Administered 2022-08-11: 60 mg via SUBCUTANEOUS

## 2022-08-11 MED ORDER — CLONAZEPAM 1 MG PO TABS: 1.0000 mg | ORAL_TABLET | Freq: Two times a day (BID) | ORAL | 2 refills | Status: DC | PRN

## 2022-08-11 NOTE — Patient Instructions (Addendum)
Please return in 8-10 weeks to recheck anxiety and vitamin levels. Please schedule a follow up appointment in 6 months for your next due Prolia injection. You were given one today. And please schedule your mammogram and bone density for this September.   I will release your lab results to you on your MyChart account with further instructions. You may see the results before I do, but when I review them I will send you a message with my report or have my assistant call you if things need to be discussed. Please reply to my message with any questions. Thank you!   NOTALGIA PARESTHETICA: "the unreachable itch" ... You can read about this condition. Try otc ITCH-X gel to help.   If you have any questions or concerns, please don't hesitate to send me a message via MyChart or call the office at [phone]. Thank you for visiting with [place] today! It's our pleasure caring for you.

## 2022-08-11 NOTE — Telephone Encounter (Signed)
Authorization Number: [id number] 08/11/22-08/11/23

## 2022-08-11 NOTE — Progress Notes (Unsigned)
Subjective  Chief Complaint  Patient presents with   Annual Exam    Pt here for Annual Exam and is currently fasting     HPI: Taylor Hopkins is a 72 y.o. female who presents to [facility] today for a Female Wellness Visit. She also has the concerns and/or needs as listed above in the chief complaint. These will be addressed in addition to the Health Maintenance Visit.   Wellness Visit: annual visit with health maintenance review and exam {With-without:32421} Pap  *** Chronic disease f/u and/or acute problem visit: (deemed necessary to be done in addition to the wellness visit): ***  Assessment  1. Annual physical exam   2. Major depression, recurrent, chronic (HCC)   3. Psychophysiological insomnia   4. Prediabetes   5. Gallstones   6. OSA (obstructive sleep apnea), not on CPAP   7. Gastroesophageal reflux disease with esophagitis without hemorrhage   8. Vitamin D deficiency      Plan  Female Wellness Visit: Age appropriate Health Maintenance and Prevention measures were discussed with patient. Included topics are cancer screening recommendations, ways to keep healthy (see AVS) including dietary and exercise recommendations, regular eye and dental care, use of seat belts, and avoidance of moderate alcohol use and tobacco use.  BMI: discussed patient's BMI and encouraged positive lifestyle modifications to help get to or maintain a target BMI. HM needs and immunizations were addressed and ordered. See below for orders. See HM and immunization section for updates. Routine labs and screening tests ordered including cmp, cbc and lipids where appropriate. Discussed recommendations regarding Vit D and calcium supplementation (see AVS)  Chronic disease management visit and/or acute problem visit: *** Follow up: ***  Orders Placed This Encounter  Procedures   CBC with Differential/Platelet   Comprehensive metabolic panel   Lipid panel   Hemoglobin  A1c   TSH   VITAMIN D 25 Hydroxy (Vit-D Deficiency, Fractures)   Vitamin B12   No orders of the defined types were placed in this encounter.     Body mass index is 30.57 kg/m. Wt Readings from Last 3 Encounters:  08/11/22 189 lb 6.4 oz (85.9 kg)  04/14/22 184 lb (83.5 kg)  03/14/22 186 lb 2 oz (84.4 kg)     Patient Active Problem List   Diagnosis Date Noted   Adenomatous polyp of colon 07/04/2021    Priority: High   Lumbar radiculopathy, chronic 07/26/2019    Priority: High   Prediabetes 07/26/2019    Priority: High   Insomnia 07/04/2018    Priority: High   Osteoporosis 07/04/2018    Priority: High    ? H/o osteoporosis, failed biphosphonates due to gastritis. Don't have record. Last dexa 2019 with osteopenia T = -2.0 lowest.  DEXA June 2020: osteopenia T=-2.1 lowest. Reports h/o prior fracture. DEXA 2022: lowest T = -2.4 femur    Major depression, recurrent, chronic (HCC) 07/04/2018    Priority: High     failed zoloft, celexa, trazadone, and effexor and lexapro Failed prozac due to side effects: nausea     OSA (obstructive sleep apnea), not on CPAP 07/04/2018    Priority: High   Urinary, incontinence, stress female 03/28/2020    Priority: Medium    Urinary incontinence, mixed 03/28/2020    Priority: Medium    Gallstones 07/26/2019    Priority: Medium     asymtomatic    S/P dilatation of esophageal stricture 07/23/2018    Priority: Medium  Colon polyp 07/04/2018    Priority: Medium    IBS (irritable bowel syndrome) 07/04/2018    Priority: Medium    Chronic neck pain due to DJD, DDD, unable to tolerate NSAIDs 07/04/2018    Priority: Medium    GERD (gastroesophageal reflux disease) 10/02/2008    Priority: Medium    Seasonal allergic rhinitis due to pollen 07/26/2019    Priority: Low   Vitamin D deficiency 07/04/2018    Priority: Low   Health Maintenance  Topic Date Due   Zoster Vaccines- Shingrix (1 of 2) Never done   COVID-19 Vaccine (4 - 2023-24  season) 08/27/2022 (Originally 10/25/2021)   Colonoscopy  10/17/2022 (Originally 07/16/2021)   INFLUENZA VACCINE  09/25/2022   DEXA SCAN  10/13/2022   MAMMOGRAM  11/14/2022   Medicare Annual Wellness (AWV)  04/15/2023   Pneumonia Vaccine 46+ Years old  Completed   Hepatitis C Screening  Completed   HPV VACCINES  Aged Out   DTaP/Tdap/Td  Discontinued   Immunization History  Administered Date(s) Administered   Fluad Quad(high Dose 65+) 11/19/2018   PFIZER(Purple Top)SARS-COV-2 Vaccination 06/03/2019, 06/24/2019, 03/24/2020   Pneumococcal Conjugate-13 03/28/2020   Pneumococcal Polysaccharide-23 11/19/2018   We updated and reviewed the patient's past history in detail and it is documented below. Allergies: Patient is allergic to fluoxetine, prozac [fluoxetine hcl], sulfonamide derivatives, adhesive  [tape], other, sulfa antibiotics, and wound dressing adhesive. Past Medical History Patient  has a past medical history of Adenomatous colon polyp (06/2003), Allergic rhinitis, Anxiety, Arthritis, Cataract (2010), COVID-19, Depression, Gallstones (07/26/2019), GERD (gastroesophageal reflux disease), and Sleep apnea. Past Surgical History Patient  has a past surgical history that includes Tonsillectomy; Lumbar disc surgery; Total abdominal hysterectomy; Ankle surgery (Right); Breast enhancement surgery; Colonoscopy; and Upper gastrointestinal endoscopy. Family History: Patient family history includes Allergies in her brother; Colon polyps in her father; Diabetes in her mother; Diverticulitis in her father; Esophageal cancer in her maternal grandmother; Heart attack in her father; Heart failure in her father and mother; Kidney failure in her mother; Mitral valve prolapse in her mother; Stomach cancer in her paternal uncle. Social History:  Patient  reports that she has never smoked. She has never used smokeless tobacco. She reports current alcohol use. She reports that she does not use drugs.  Review  of Systems: Constitutional: negative for fever or malaise Ophthalmic: negative for photophobia, double vision or loss of vision Cardiovascular: negative for chest pain, dyspnea on exertion, or new LE swelling Respiratory: negative for SOB or persistent cough Gastrointestinal: negative for abdominal pain, change in bowel habits or melena Genitourinary: negative for dysuria or gross hematuria, no abnormal uterine bleeding or disharge Musculoskeletal: negative for new gait disturbance or muscular weakness Integumentary: negative for new or persistent rashes, no breast lumps Neurological: negative for TIA or stroke symptoms Psychiatric: negative for SI or delusions Allergic/Immunologic: negative for hives  Patient Care Team    Relationship Specialty Notifications Start End  Willow Ora, MD PCP - General Family Medicine  01/26/19   Ginette Otto, [facility] Consulting Physician Gynecology  07/02/18    Comment: Tera Helper, MD Consulting Physician Sports Medicine  07/02/18   Meryl Dare, MD Consulting Physician Gastroenterology  07/02/18   Tyrell Antonio, MD Consulting Physician Physical Medicine and Rehabilitation  11/19/18     Objective  Vitals: BP 138/80   Pulse 78   Temp 98.2 F (36.8 C)   Ht 5\' 6"  (1.676 m)   Wt 189 lb 6.4 oz (  85.9 kg)   SpO2 96%   BMI 30.57 kg/m  General:  Well developed, well nourished, no acute distress  Psych:  Alert and orientedx3,normal mood and affect HEENT:  Normocephalic, atraumatic, non-icteric sclera,  supple neck without adenopathy, mass or thyromegaly Cardiovascular:  Normal S1, S2, RRR without gallop, rub or murmur Respiratory:  Good breath sounds bilaterally, CTAB with normal respiratory effort Gastrointestinal: normal bowel sounds, soft, non-tender, no noted masses. No HSM MSK: extremities without edema, joints without erythema or swelling Neurologic:    Mental status is normal.  Gross motor and sensory exams are normal.   No tremor  Commons side effects, risks, benefits, and alternatives for medications and treatment plan prescribed today were discussed, and the patient expressed understanding of the given instructions. Patient is instructed to call or message via MyChart if he/she has any questions or concerns regarding our treatment plan. No barriers to understanding were identified. We discussed Red Flag symptoms and signs in detail. Patient expressed understanding regarding what to do in case of urgent or emergency type symptoms.  Medication list was reconciled, printed and provided to the patient in AVS. Patient instructions and summary information was reviewed with the patient as documented in the AVS. This note was prepared with assistance of Dragon voice recognition software. Occasional wrong-word or sound-a-like substitutions may have occurred due to the inherent limitations of voice recognition software

## 2022-08-12 ENCOUNTER — Emergency Department (HOSPITAL_COMMUNITY): Payer: Medicare Other

## 2022-08-12 ENCOUNTER — Other Ambulatory Visit: Payer: Self-pay

## 2022-08-12 ENCOUNTER — Encounter (HOSPITAL_COMMUNITY): Payer: Self-pay

## 2022-08-12 ENCOUNTER — Emergency Department (HOSPITAL_COMMUNITY)
Admission: EM | Admit: 2022-08-12 | Discharge: 2022-08-12 | Disposition: A | Discharge status: Home / Self Care | Payer: Medicare Other | Attending: Emergency Medicine | Admitting: Emergency Medicine

## 2022-08-12 ENCOUNTER — Telehealth: Payer: Self-pay | Admitting: Family Medicine

## 2022-08-12 DIAGNOSIS — K920 Hematemesis: Secondary | ICD-10-CM | POA: Insufficient documentation

## 2022-08-12 LAB — MAGNESIUM: Magnesium: 2.1 mg/dL (ref 1.7–2.4)

## 2022-08-12 LAB — CBC WITH DIFFERENTIAL/PLATELET
Abs Immature Granulocytes: 0.05 10*3/uL (ref 0.00–0.07)
Basophils Absolute: 0 10*3/uL (ref 0.0–0.1)
Basophils Relative: 0 %
Eosinophils Absolute: 0 10*3/uL (ref 0.0–0.5)
Eosinophils Relative: 0 %
HCT: 41.5 % (ref 36.0–46.0)
Hemoglobin: 14.2 g/dL (ref 12.0–15.0)
Immature Granulocytes: 1 %
Lymphocytes Relative: 13 %
Lymphs Abs: 1.3 10*3/uL (ref 0.7–4.0)
MCH: 33.2 pg (ref 26.0–34.0)
MCHC: 34.2 g/dL (ref 30.0–36.0)
MCV: 97 fL (ref 80.0–100.0)
Monocytes Absolute: 0.7 10*3/uL (ref 0.1–1.0)
Monocytes Relative: 8 %
Neutro Abs: 7.6 10*3/uL (ref 1.7–7.7)
Neutrophils Relative %: 78 %
Platelets: 160 10*3/uL (ref 150–400)
RBC: 4.28 MIL/uL (ref 3.87–5.11)
RDW: 12.4 % (ref 11.5–15.5)
WBC: 9.7 10*3/uL (ref 4.0–10.5)
nRBC: 0 % (ref 0.0–0.2)

## 2022-08-12 LAB — COMPREHENSIVE METABOLIC PANEL
ALT: 18 U/L (ref 0–44)
AST: 23 U/L (ref 15–41)
Albumin: 3.5 g/dL (ref 3.5–5.0)
Alkaline Phosphatase: 40 U/L (ref 38–126)
Anion gap: 13 (ref 5–15)
BUN: 15 mg/dL (ref 8–23)
CO2: 23 mmol/L (ref 22–32)
Calcium: 8.7 mg/dL — ABNORMAL LOW (ref 8.9–10.3)
Chloride: 103 mmol/L (ref 98–111)
Creatinine, Ser: 0.71 mg/dL (ref 0.44–1.00)
GFR, Estimated: >60 mL/min (ref >60)
Glucose, Bld: 107 mg/dL — ABNORMAL HIGH (ref 70–99)
Potassium: 3.6 mmol/L (ref 3.5–5.1)
Sodium: 139 mmol/L (ref 135–145)
Total Bilirubin: 0.7 mg/dL (ref 0.3–1.2)
Total Protein: 6.4 g/dL — ABNORMAL LOW (ref 6.5–8.1)

## 2022-08-12 LAB — TYPE AND SCREEN
ABO/RH(D): O POS
Antibody Screen: NEGATIVE

## 2022-08-12 LAB — LIPASE, BLOOD: Lipase: 26 U/L (ref 11–51)

## 2022-08-12 NOTE — ED Triage Notes (Signed)
Pt c/o cough and vomiting "black stuff" starting last night.  Denies new pain.  Hx of chronic L abdominal pain.  Denies blood thinners and iron supplements.

## 2022-08-12 NOTE — Discharge Instructions (Signed)
Continue Dexilant daily.  Avoid alcohol, tobacco, and NSAID medications.  Call Dr. Ardell Isaacs office to schedule a close follow-up.  Return to the emergency department for any worsening of symptoms.

## 2022-08-12 NOTE — Telephone Encounter (Signed)
FYI: This call has been transferred to Access Nurse. Once the result note has been entered staff can address the message at that time.  Patient called in with the following symptoms:  Red Word: LLQ pain and cough with black coffee ground like sputum began last night   Please advise at Mobile [phone] (mobile)  Message is routed to Provider Pool and [facility] Triage

## 2022-08-12 NOTE — ED Notes (Signed)
ED Provider at bedside. 

## 2022-08-12 NOTE — Telephone Encounter (Signed)
Final outcome:  Call EMS 911 Now. Patient is currently at the ED.   Patient Name First: Taylor Last: Hopkins Gender: Female DOB: [birth year] Age: 72 Y 10 M 9 D Return Phone Number: [phone] (Primary), [phone] (Secondary) Address: City/ State/ Zip: [place]  [address] Client [facility] - Administrator, [facility] at [facility] Provider Asencion Partridge- MD Contact Type Call Who Is Calling Patient / Member / Family / Caregiver Call Type Triage / Clinical Relationship To Patient Self Return Phone Number [phone] (Secondary) Chief Complaint Vomiting Reason for Call Symptomatic / Request for Health Information Initial Comment Office transferring caller patient was in the office yesterday but last night she started coughing with black coffee ground vomit and lower left stomach pains Translation No Nurse Assessment Nurse: Carylon Perches, RN, Hilda Lias Date/Time (Eastern Time): 08/12/2022 9:54:22 AM Confirm and document reason for call. If symptomatic, describe symptoms. ---Caller states that last night she started coughing with black coffee ground vomit and lower left abdominal pain. Does the patient have any new or worsening symptoms? ---Yes Will a triage be completed? ---Yes Related visit to physician within the last 2 weeks? ---Yes Does the PT have any chronic conditions? (i.e. diabetes, asthma, this includes High risk factors for pregnancy, etc.) ---Yes List chronic conditions. ---GERD Is this a behavioral health or substance abuse call? ---No Guidelines Guideline Title Affirmed Question Affirmed Notes Nurse Date/Time (Eastern Time) Vomiting Blood [1] Vomited blood AND [2] large amount (example: "a cup of blood") Mordecai Maes 08/12/2022 9:56:46 AM PLEASE NOTE: All timestamps contained within this report are represented as [place] Standard Time. CONFIDENTIALTY NOTICE: This fax transmission is intended only  for the addressee. It contains information that is legally privileged, confidential or otherwise protected from use or disclosure. If you are not the intended recipient, you are strictly prohibited from reviewing, disclosing, copying using or disseminating any of this information or taking any action in reliance on or regarding this information. If you have received this fax in error, please notify us immediately by telephone so that we can arrange for its return to [place]. Phone: [phone], Toll-Free: [phone], Fax: [phone] Page: 2 of 2 Call Id: [id number] Disp. Time Lamount Cohen Time) Disposition Final User 08/12/2022 9:31:57 AM Attempt made - message left Mordecai Maes 08/12/2022 9:41:07 AM Attempt made - message left Mordecai Maes 08/12/2022 9:58:47 AM Call EMS 911 Now Yes Carylon Perches, RN, Hilda Lias 08/12/2022 10:02:33 AM 911 Outcome Documentation Carylon Perches, RN, Hilda Lias Reason: EMS transport declined, husband will drive her to ER Final Disposition 08/12/2022 9:58:47 AM Call EMS 911 Now Yes Carylon Perches, RN, Seward Grater Disagree/Comply Comply Caller Understands Yes PreDisposition Did not know what to do Care Advice Given Per Guideline CALL EMS 911 NOW: * Immediate medical attention is needed. You need to hang up and call 911 (or an ambulance). CARE ADVICE given per Vomiting Blood (Adult) guideline. Comments User: Gean Maidens Date/Time Lamount Cohen Time): 08/12/2022 9:48:42 AM Pt wants the nurse to call her on another line because she states the other number is not working. [phone] Referrals [facility] - ED

## 2022-08-12 NOTE — ED Provider Notes (Signed)
[facility] AT [facility] Provider Note   CSN: [id number] Arrival date & time: 08/12/22  1058     History  Chief Complaint  Patient presents with   Cough   Hematemesis    Taylor Hopkins is a 72 y.o. female.  HPI Patient presents for coffee-ground emesis.  Medical history includes GERD, IBS, OSA, depression, prior stricture, anxiety.  Last night, patient had episode of coughing.  She subsequently vomited.  Vomitus contained coffee-ground appearance.  She contacted her PCPs office and was advised to come into the ED.  She has not had any nausea or vomiting today.  She has not tried to eat or drink anything today.  She has had some chronic left-sided abdominal pain, which is minimal at this time.  She denies any other current symptoms.    Home Medications Prior to Admission medications   Medication Sig Start Date End Date Taking? Authorizing Provider  atorvastatin (LIPITOR) 10 MG tablet Take 1 tablet (10 mg total) by mouth at bedtime. 03/20/22   Willow Ora, MD  BIOTIN PO Take 3,000 mcg by mouth.    [provider]  buPROPion (WELLBUTRIN) 75 MG tablet Take 1 tablet (75 mg total) by mouth 2 (two) times daily. 06/12/22   Willow Ora, MD  clonazePAM (KLONOPIN) 1 MG tablet Take 1 tablet (1 mg total) by mouth 2 (two) times daily as needed for anxiety. 08/11/22   Willow Ora, MD  dexlansoprazole (DEXILANT) 60 MG capsule Take 1 capsule (60 mg total) by mouth daily. 06/12/22   Willow Ora, MD  oxybutynin (DITROPAN XL) 10 MG 24 hr tablet Take 1 tablet (10 mg total) by mouth at bedtime. 08/11/22   Willow Ora, MD  valACYclovir (VALTREX) 1000 MG tablet TAKE 2 TABLETS BY MOUTH 2 (TWO) TIMES DAILY. ONCE, AS NEEDED FOR COLD SORES 02/19/21   Willow Ora, MD      Allergies    Fluoxetine, Prozac [fluoxetine hcl], Sulfonamide derivatives, Adhesive  [tape], Other, Sulfa antibiotics, and Wound dressing adhesive    Review of Systems   Review  of Systems  Respiratory:  Positive for cough.   Gastrointestinal:  Positive for abdominal pain and vomiting.  All other systems reviewed and are negative.   Physical Exam Updated Vital Signs BP 129/89   Pulse 87   Temp 98.4 F (36.9 C) (Oral)   Resp (!) 23   Ht 5\' 9"  (1.753 m)   Wt 85.7 kg   SpO2 96%   BMI 27.91 kg/m  Physical Exam Vitals and nursing note reviewed.  Constitutional:      General: She is not in acute distress.    Appearance: Normal appearance. She is well-developed. She is not ill-appearing, toxic-appearing or diaphoretic.  HENT:     Head: Normocephalic and atraumatic.     Right Ear: External ear normal.     Left Ear: External ear normal.     Nose: Nose normal.     Mouth/Throat:     Mouth: Mucous membranes are moist.  Eyes:     Extraocular Movements: Extraocular movements intact.     Conjunctiva/sclera: Conjunctivae normal.  Cardiovascular:     Rate and Rhythm: Normal rate and regular rhythm.     Heart sounds: No murmur heard. Pulmonary:     Effort: Pulmonary effort is normal. No respiratory distress.     Breath sounds: Normal breath sounds. No wheezing or rales.  Abdominal:     General: There is  no distension.     Palpations: Abdomen is soft.     Tenderness: There is no abdominal tenderness.  Musculoskeletal:        General: No swelling. Normal range of motion.     Cervical back: Normal range of motion and neck supple.  Skin:    General: Skin is warm and dry.     Coloration: Skin is not jaundiced or pale.  Neurological:     General: No focal deficit present.     Mental Status: She is alert and oriented to person, place, and time.  Psychiatric:        Mood and Affect: Mood normal.        Behavior: Behavior normal.        Thought Content: Thought content normal.        Judgment: Judgment normal.     ED Results / Procedures / Treatments   Labs (all labs ordered are listed, but only abnormal results are displayed) Labs Reviewed   COMPREHENSIVE METABOLIC PANEL - Abnormal; Notable for the following components:      Result Value   Glucose, Bld 107 (*)    Calcium 8.7 (*)    Total Protein 6.4 (*)    All other components within normal limits  LIPASE, BLOOD  CBC WITH DIFFERENTIAL/PLATELET  MAGNESIUM  TYPE AND SCREEN    EKG EKG Interpretation  Date/Time:  Tuesday August 12 2022 11:38:46 EDT Ventricular Rate:  85 PR Interval:  157 QRS Duration: 85 QT Interval:  350 QTC Calculation: 417 R Axis:   12 Text Interpretation: Sinus rhythm Confirmed by Gloris Manchester [phone]) on 08/12/2022 12:07:59 PM  Radiology DG Chest Portable 1 View  Result Date: 08/12/2022 CLINICAL DATA:  Cough. EXAM: PORTABLE CHEST 1 VIEW COMPARISON:  None Available. FINDINGS: The heart size and mediastinal contours are within normal limits. Both lungs are clear. The visualized skeletal structures are unremarkable. IMPRESSION: No active disease. Electronically Signed   By: Lupita Raider M.D.   On: 08/12/2022 12:29    Procedures Procedures    Medications Ordered in ED Medications - No data to display  ED Course/ Medical Decision Making/ A&P                             Medical Decision Making Amount and/or Complexity of Data Reviewed Labs: ordered. Radiology: ordered.   This patient presents to the ED for concern of coffee-ground emesis, this involves an extensive number of treatment options, and is a complaint that carries with it a high risk of complications and morbidity.  The differential diagnosis includes gastritis, Mallory-Weiss tear, esophageal perforation, epistaxis, neoplasm   Co morbidities that complicate the patient evaluation  GERD, IBS, OSA, depression, prior stricture, anxiety   Additional history obtained:  Additional history obtained from N/A External records from outside source obtained and reviewed including EMR   Lab Tests:  I Ordered, and personally interpreted labs.  The pertinent results include: No drop in  hemoglobin, no leukocytosis, normal kidney function, normal electrolytes   Imaging Studies ordered:  I ordered imaging studies including chest x-ray I independently visualized and interpreted imaging which showed no acute findings I agree with the radiologist interpretation   Cardiac Monitoring: / EKG:  The patient was maintained on a cardiac monitor.  I personally viewed and interpreted the cardiac monitored which showed an underlying rhythm of: Sinus rhythm   Problem List / ED Course / Critical interventions / Medication management  Patient presents for transient episode of vomiting that occurred last night.  Vomitus was dark in appearance and described as coffee-ground.  She has not had any further episodes.  She denies any current nausea.  She does describe a chronic left-sided abdominal pain which is minimal at this time.  On exam, abdomen is soft and without tenderness.  Patient is well-appearing on exam.  Breathing is unlabored.  Laboratory workup was initiated.  Given her intermittent cough, chest x-ray was obtained.  Results of workup were reassuring.  There is no evidence of clinically significant blood loss.  Patient has had normal bowel movements, including today.  She denies any dark color to her stools.  Patient was given food and drink in the ED, which she was able to tolerate without any difficulty.  She remained asymptomatic.  She is stable for discharge home.  She was advised to follow-up with gastroenterology for possible endoscopy.  She was discharged in good condition.   Social Determinants of Health:  Has access to outpatient care        Final Clinical Impression(s) / ED Diagnoses Final diagnoses:  Coffee ground emesis    Rx / DC Orders ED Discharge Orders     None         Gloris Manchester, MD 08/12/22 1821

## 2022-08-12 NOTE — Telephone Encounter (Signed)
Patient returned Triage call-whom left voice mail. Requests phone# [phone] be used for contacting Patient  Transferred to Triage

## 2022-08-13 ENCOUNTER — Telehealth: Payer: Self-pay | Admitting: Gastroenterology

## 2022-08-13 NOTE — Telephone Encounter (Signed)
Left message on machine to call back  

## 2022-08-13 NOTE — Telephone Encounter (Signed)
Inbound call from patient returning phone call. Requesting a call back. Please advise, thank you.  

## 2022-08-13 NOTE — Telephone Encounter (Signed)
Thank you :)

## 2022-08-13 NOTE — Telephone Encounter (Signed)
Please add her on to a 7 day APP hold appt. Continue Dexilant every day. Avoid ASA/NSAIDs for now.

## 2022-08-13 NOTE — Telephone Encounter (Signed)
Inbound call from patient , want to speak with a nurse, she is looking for a sooner appt . Patient is schedule for 8/20 but stated she ended it up at the ED yesterday and was told to follow up with [place].Please advise

## 2022-08-13 NOTE — Telephone Encounter (Signed)
PT is scheduled for tomorrow with Wilmon Pali @2pm 

## 2022-08-13 NOTE — Telephone Encounter (Signed)
Patient is returning your call. Please call her back at [phone]

## 2022-08-13 NOTE — Telephone Encounter (Signed)
Dr Russella Dar this pt was last seen in 2021- she went to the ED last night for coffee ground emesis and was told to f/u with GI.  We have no appts available except the 7 day hold spots that we can not use until tomorrow.  Do you want to direct scope her? Or try and get one of the 7 day holds for 6/27 tomorrow.

## 2022-08-14 ENCOUNTER — Encounter: Payer: Self-pay | Admitting: Nurse Practitioner

## 2022-08-14 ENCOUNTER — Ambulatory Visit: Payer: Medicare Other | Admitting: Nurse Practitioner

## 2022-08-14 DIAGNOSIS — K219 Gastro-esophageal reflux disease without esophagitis: Secondary | ICD-10-CM

## 2022-08-14 DIAGNOSIS — K920 Hematemesis: Secondary | ICD-10-CM | POA: Diagnosis not present

## 2022-08-14 DIAGNOSIS — Z86010 Personal history of colonic polyps: Secondary | ICD-10-CM

## 2022-08-14 MED ORDER — NA SULFATE-K SULFATE-MG SULF 17.5-3.13-1.6 GM/177ML PO SOLN: 1.0000 | ORAL | 0 refills | Status: DC

## 2022-08-14 NOTE — Patient Instructions (Signed)
_______________________________________________________  If your blood pressure at your visit was 140/90 or greater, please contact your primary care physician to follow up on this. _______________________________________________________  If you are age 73 or older, your body mass index should be between 23-30. Your Body mass index is 27.76 kg/m. If this is out of the aforementioned range listed, please consider follow up with your Primary Care Provider. ________________________________________________________  The [facility] providers would like to encourage you to use [facility] to communicate with providers for non-urgent requests or questions.  Due to long hold times on the telephone, sending your provider a message by [facility] may be a faster and more efficient way to get a response.  Please allow 48 business hours for a response.  Please remember that this is for non-urgent requests.  _______________________________________________________  Bonita Quin have been scheduled for an endoscopy and colonoscopy. Please follow the written instructions given to you at your visit today. Please pick up your prep supplies at the pharmacy within the next 1-3 days. If you use inhalers (even only as needed), please bring them with you on the day of your procedure.  Due to recent changes in healthcare laws, you may see the results of your imaging and laboratory studies on MyChart before your provider has had a chance to review them.  We understand that in some cases there may be results that are confusing or concerning to you. Not all laboratory results come back in the same time frame and the provider may be waiting for multiple results in order to interpret others.  Please give [place] 48 hours in order for your provider to thoroughly review all the results before contacting the office for clarification of your results.   Thank you for entrusting me with your care and choosing [facility].  Gunnar Fusi, NP

## 2022-08-14 NOTE — Progress Notes (Signed)
Assessment and Plan   Primary GI:  Claudette Head, MD  Brief Narrative:  72 y.o.  female whose past medical history includes,  but is not necessarily limited to, colon polyps, GERD  Coffee ground emesis ( now resolved). Evaluated in ED 6/18.  Hemoglobin and BUN normal.  Etiology unclear, ? erosive disease, PUD, AVMs - Schedule for EGD. The risks and benefits of EGD with possible biopsies were discussed with the patient who agrees to proceed.  -To expedite care,procedures were scheduled with Dr. Adela Lank next week -Call office or go ED for any recurrent black emesis while awaiting procedures  History of colon polyps.  A 14 mm sessile serrated adenoma was removed in 2018, polyp surveillance colonoscopy is due Colonoscopy to be done at time of EGD. The risks and benefits of colonoscopy with possible polypectomy / biopsies were discussed and the patient agrees to proceed.   History of chronic mildly active colitis found on colon biopsies in 2014.  She was asymptomatic, no treatment required.  Plan was to reevaluate at time of next colonoscopy   GERD, asymptomatic on Dexilant ( unless she eats spicy food)   History of Present Illness   Chief complaint:  black vomit  Two nights ago Taylor Hopkins awoke from sleep coughing . Subsequently,  had 5 episodes of black emesis. No associated abdominal pain at the time but she chronically she gets epigastric pain with spicy or some fatty foods.  She takes daily Dexilant for GERD. No NSAID use.  No melena. Bowel movements are normal. Went to ED 6/18 . Hemoglobin was normal at 14.2 / hematocrit 41.5%.  BUN 15  Taylor Hopkins had intentionally lost a lot of weight but then gained 15 pounds back.  She has no complaints of dysphagia but does have a history of a benign esophageal stenosis status post dilation has been a gassy.  This patient   Previous GI Endoscopies / Labs / Imaging  Polyp surveillance colonoscopy May 2018 - One 14 mm polyp in the ascending  colon, removed with a hot snare. Resected and retrieved. - One 6 mm polyp in the descending colon, removed with a cold snare. Resected and retrieved. - Localized mild inflammation was found in the ascending colon and in the cecum secondary to colitis. Biopsied. - Mild diverticulosis in the left colon. There was no evidence of diverticular bleeding. - Internal hemorrhoids. - The examination was otherwise normal   EGD May 2018 for dysphagia - Benign-appearing esophageal stenosis. Dilated. - Small hiatal hernia. - Normal duodenal bulb and second portion of the duodenum. - No specimens collected.  Diagnosis 1. Surgical [P], ascending, polyp - SESSILE SERRATED POLYP/ADENOMA. - NO DYSPLASIA OR MALIGNANCY. 2. Surgical [P], ascending and cecum - CHRONIC MILDLY ACTIVE COLITIS, SEE COMMENT. - NO DYSPLASIA OR MALIGNANCY. 3. Surgical [P], descending, polyp - BENIGN COLONIC MUCOSA WITH LYMPHOID AGGREGATE. - NO DYSPLASIA OR MALIGNANCY   B colitis EGD May 2020 for dysphagia and GERD - Benign-appearing esophageal stenosis. Dilated. - Erythematous mucosa in the gastric fundus and gastric body. Biopsied. - Small hiatal hernia. - Normal duodenal bulb and second portion of the duodenum Gastric biopsies normal     Latest Ref Rng & Units 08/12/2022   11:54 AM 08/11/2022    9:06 AM 07/04/2021   10:44 AM  Hepatic Function  Total Protein 6.5 - 8.1 g/dL 6.4  6.6  6.4   Albumin 3.5 - 5.0 g/dL 3.5  3.9  4.1   AST 15 - 41 U/L 23  18  18   ALT 0 - 44 U/L 18  15  13    Alk Phosphatase 38 - 126 U/L 40  40  53   Total Bilirubin 0.3 - 1.2 mg/dL 0.7  0.5  0.5        Latest Ref Rng & Units 08/12/2022   11:54 AM 08/11/2022    9:06 AM 07/04/2021   10:44 AM  CBC  WBC 4.0 - 10.5 K/uL 9.7  6.3  4.9   Hemoglobin 12.0 - 15.0 g/dL 44.0  10.2  72.5   Hematocrit 36.0 - 46.0 % 41.5  40.7  39.8   Platelets 150 - 400 K/uL 160  162.0  149.0      Past Medical History:  Diagnosis Date   Adenomatous colon polyp 06/2003    Allergic rhinitis    Anxiety    Arthritis    Cataract 2010   bilateral eyes   COVID-19    Depression    Gallstones 07/26/2019   asymtomatic   GERD (gastroesophageal reflux disease)    Sleep apnea    Not currently using CPAP - last sleep study 2016    Past Surgical History:  Procedure Laterality Date   ANKLE SURGERY Right    BREAST ENHANCEMENT SURGERY     COLONOSCOPY     LUMBAR DISC SURGERY     TONSILLECTOMY     TOTAL ABDOMINAL HYSTERECTOMY     UPPER GASTROINTESTINAL ENDOSCOPY      Current Medications, Allergies, Family History and Social History were reviewed in [employer] record.     Current Outpatient Medications  Medication Sig Dispense Refill   atorvastatin (LIPITOR) 10 MG tablet Take 1 tablet (10 mg total) by mouth at bedtime. 90 tablet 3   BIOTIN PO Take 3,000 mcg by mouth.     buPROPion (WELLBUTRIN) 75 MG tablet Take 1 tablet (75 mg total) by mouth 2 (two) times daily. 180 tablet 3   clonazePAM (KLONOPIN) 1 MG tablet Take 1 tablet (1 mg total) by mouth 2 (two) times daily as needed for anxiety. 60 tablet 2   dexlansoprazole (DEXILANT) 60 MG capsule Take 1 capsule (60 mg total) by mouth daily. 90 capsule 3   oxybutynin (DITROPAN XL) 10 MG 24 hr tablet Take 1 tablet (10 mg total) by mouth at bedtime. 30 tablet 11   valACYclovir (VALTREX) 1000 MG tablet TAKE 2 TABLETS BY MOUTH 2 (TWO) TIMES DAILY. ONCE, AS NEEDED FOR COLD SORES 20 tablet 2   No current facility-administered medications for this visit.    Review of Systems: All systems reviewed and negative except where noted above.   Physical Exam  Wt Readings from Last 3 Encounters:  08/12/22 189 lb (85.7 kg)  08/11/22 189 lb 6.4 oz (85.9 kg)  04/14/22 184 lb (83.5 kg)    BP 120/70   Ht 5\' 9"  (1.753 m)   Wt 188 lb (85.3 kg)   BMI 27.76 kg/m  Constitutional:  Pleasant, generally well appearing female in no acute distress. Psychiatric: Normal mood and affect. Behavior is  normal. EENT: Pupils normal.  Conjunctivae are normal. No scleral icterus. Neck supple.  Cardiovascular: Normal rate, regular rhythm.  Pulmonary/chest: Effort normal and breath sounds normal. No wheezing, rales or rhonchi. Abdominal: Soft, nondistended, nontender. Bowel sounds active throughout. There are no masses palpable. No hepatomegaly. Neurological: Alert and oriented to person place and time.    Willette Cluster, NP  08/14/2022, 1:27 PM

## 2022-08-17 ENCOUNTER — Encounter: Payer: Self-pay | Admitting: Family Medicine

## 2022-08-18 ENCOUNTER — Other Ambulatory Visit: Payer: Self-pay

## 2022-08-18 MED ORDER — CLOBETASOL PROPIONATE 0.05 % EX SOLN: 1.0000 | Freq: Two times a day (BID) | CUTANEOUS | 0 refills | Status: AC

## 2022-08-19 ENCOUNTER — Ambulatory Visit (AMBULATORY_SURGERY_CENTER): Payer: Medicare Other | Admitting: Gastroenterology

## 2022-08-19 ENCOUNTER — Encounter: Payer: Self-pay | Admitting: Gastroenterology

## 2022-08-19 VITALS — BP 114/66 | HR 81 | Temp 98.4°F | Resp 18 | Ht 69.0 in | Wt 188.0 lb

## 2022-08-19 DIAGNOSIS — D123 Benign neoplasm of transverse colon: Secondary | ICD-10-CM | POA: Diagnosis not present

## 2022-08-19 DIAGNOSIS — Z09 Encounter for follow-up examination after completed treatment for conditions other than malignant neoplasm: Secondary | ICD-10-CM

## 2022-08-19 DIAGNOSIS — D122 Benign neoplasm of ascending colon: Secondary | ICD-10-CM

## 2022-08-19 DIAGNOSIS — K449 Diaphragmatic hernia without obstruction or gangrene: Secondary | ICD-10-CM

## 2022-08-19 DIAGNOSIS — K317 Polyp of stomach and duodenum: Secondary | ICD-10-CM | POA: Diagnosis not present

## 2022-08-19 DIAGNOSIS — K635 Polyp of colon: Secondary | ICD-10-CM | POA: Diagnosis not present

## 2022-08-19 DIAGNOSIS — K920 Hematemesis: Secondary | ICD-10-CM

## 2022-08-19 DIAGNOSIS — K259 Gastric ulcer, unspecified as acute or chronic, without hemorrhage or perforation: Secondary | ICD-10-CM | POA: Diagnosis not present

## 2022-08-19 DIAGNOSIS — Z86010 Personal history of colonic polyps: Secondary | ICD-10-CM

## 2022-08-19 DIAGNOSIS — K3189 Other diseases of stomach and duodenum: Secondary | ICD-10-CM

## 2022-08-19 DIAGNOSIS — K319 Disease of stomach and duodenum, unspecified: Secondary | ICD-10-CM | POA: Diagnosis not present

## 2022-08-19 DIAGNOSIS — K219 Gastro-esophageal reflux disease without esophagitis: Secondary | ICD-10-CM

## 2022-08-19 MED ORDER — SODIUM CHLORIDE 0.9 % IV SOLN
500.0000 mL | Freq: Once | INTRAVENOUS | Status: DC
Start: 2022-08-19 — End: 2022-08-19

## 2022-08-19 NOTE — Progress Notes (Signed)
History and Physical Interval Note: History of dark emesis recently leading to ED visit. Hgb and BUN normal. On Dexilant. EGD to further evaluate. Also with history of colon polyps - last exam 2018 14mm SSP removed. Incidentally had mild R sided colitis at that time. No interval changes since her office visit on 08/14/22. Of note, she has rare dysphagia. Stricture dilated at the [facility] in the past. She asked that I dilate the stricture if it has recurred endoscopically. Discussed risks / benefits she wishes to proceed.   08/19/2022 3:35 PM  Taylor Hopkins  has presented today for endoscopic procedure(s), with the diagnosis of  Encounter Diagnoses  Name Primary?   Dark emesis Yes   Gastroesophageal reflux disease, unspecified whether esophagitis present    Hx of colonic polyps   .  The various methods of evaluation and treatment have been discussed with the patient and/or family. After consideration of risks, benefits and other options for treatment, the patient has consented to  the endoscopic procedure(s).   The patient's history has been reviewed, patient examined, no change in status, stable for surgery.  I have reviewed the patient's chart and labs.  Questions were answered to the patient's satisfaction.    Harlin Rain, MD [facility]

## 2022-08-19 NOTE — Patient Instructions (Addendum)
Resume all of your previous medications today.  B e sure to take your dexelant on an empty stomach. Read all of the handouts given to you by your recovery room nurse.  YOU HAD AN ENDOSCOPIC PROCEDURE TODAY AT THE [facility]:   Refer to the procedure report that was given to you for any specific questions about what was found during the examination.  If the procedure report does not answer your questions, please call your gastroenterologist to clarify.  If you requested that your care partner not be given the details of your procedure findings, then the procedure report has been included in a sealed envelope for you to review at your convenience later.  YOU SHOULD EXPECT: Some feelings of bloating in the abdomen. Passage of more gas than usual.  Walking can help get rid of the air that was put into your GI tract during the procedure and reduce the bloating. If you had a lower endoscopy (such as a colonoscopy or flexible sigmoidoscopy) you may notice spotting of blood in your stool or on the toilet paper. If you underwent a bowel prep for your procedure, you may not have a normal bowel movement for a few days.  Please Note:  You might notice some irritation and congestion in your nose or some drainage.  This is from the oxygen used during your procedure.  There is no need for concern and it should clear up in a day or so.  SYMPTOMS TO REPORT IMMEDIATELY:  Following lower endoscopy (colonoscopy or flexible sigmoidoscopy):  Excessive amounts of blood in the stool  Significant tenderness or worsening of abdominal pains  Swelling of the abdomen that is new, acute  Fever of 100F or higher  Following upper endoscopy (EGD)  Vomiting of blood or coffee ground material  New chest pain or pain under the shoulder blades  Painful or persistently difficult swallowing  New shortness of breath  Fever of 100F or higher  Black, tarry-looking stools  For urgent or emergent issues, a  gastroenterologist can be reached at any hour by calling [phone]. Do not use MyChart messaging for urgent concerns.    DIET:  We do recommend a small meal at first, but then you may proceed to your regular diet.  Drink plenty of fluids but you should avoid alcoholic beverages for 24 hours. Try to increase the fiber in your diet, and drink plenty of water.  ACTIVITY:  You should plan to take it easy for the rest of today and you should NOT DRIVE or use heavy machinery until tomorrow (because of the sedation medicines used during the test).    FOLLOW UP: Our staff will call the number listed on your records the next business day following your procedure.  We will call around 7:15- 8:00 am to check on you and address any questions or concerns that you may have regarding the information given to you following your procedure. If we do not reach you, we will leave a message.     If any biopsies were taken you will be contacted by phone or by letter within the next 1-3 weeks.  Please call us at [phone] if you have not heard about the biopsies in 3 weeks.    SIGNATURES/CONFIDENTIALITY: You and/or your care partner have signed paperwork which will be entered into your electronic medical record.  These signatures attest to the fact that that the information above on your After Visit Summary has been reviewed and is understood.  Full responsibility of the confidentiality of this discharge information lies with you and/or your care-partner.

## 2022-08-19 NOTE — Progress Notes (Signed)
Please call patient:labs show a very very high triglyceride level which needs to be lowered. Please verify that she was indeed fasting at her recent physical.  If so, we need to recheck it to confirm and start a new cholesterol medication.  Please return for fasting lipid recheck.  Also needs vitamin d 4000 units otc daily.  Potasium needs to be rechecked as well: order bmp and lipids. Dx hypertrigliceridemia and hypokalemia.  We will discuss these results at her f/u appt in auguest as well.

## 2022-08-19 NOTE — Progress Notes (Signed)
Called to room to assist during endoscopic procedure.  Patient ID and intended procedure confirmed with present staff. Received instructions for my participation in the procedure from the performing physician.  

## 2022-08-19 NOTE — Progress Notes (Signed)
Pt's states no medical or surgical changes since previsit or office visit. 

## 2022-08-19 NOTE — Progress Notes (Signed)
A and O x3. Report to RN. Tolerated MAC anesthesia well.Teeth unchanged after procedure. 

## 2022-08-19 NOTE — Op Note (Signed)
[facility] Patient Name: Taylor Hopkins Procedure Date: 08/19/2022 4:25 PM MRN: [id number] Endoscopist: Viviann Spare P. Adela Lank , MD, [id number] Age: 72 Referring MD:  Date of Birth: Jun 30, 1950 Gender: Female Account #: [id number] Procedure:                Upper GI endoscopy Indications:              Dark enemsis concerning for coffee grounds / old                            blood. Hgb normal, BUN normal. On Dexilant for                            history of GERD. Of note, Provation was down for                            this exam - NO PHOTOS taken Medicines:                Monitored Anesthesia Care Procedure:                Pre-Anesthesia Assessment:                           - Prior to the procedure, a History and Physical                            was performed, and patient medications and                            allergies were reviewed. The patient's tolerance of                            previous anesthesia was also reviewed. The risks                            and benefits of the procedure and the sedation                            options and risks were discussed with the patient.                            All questions were answered, and informed consent                            was obtained. Prior Anticoagulants: The patient has                            taken no anticoagulant or antiplatelet agents. ASA                            Grade Assessment: II - A patient with mild systemic                            disease. After reviewing the risks and benefits,  the patient was deemed in satisfactory condition to                            undergo the procedure.                           After obtaining informed consent, the endoscope was                            passed under direct vision. Throughout the                            procedure, the patient's blood pressure, pulse, and                            oxygen saturations were  monitored continuously. The                            Olympus Scope [id number] was introduced through the                            mouth, and advanced to the second part of duodenum.                            The upper GI endoscopy was accomplished without                            difficulty. The patient tolerated the procedure                            well. Findings:                 Esophagogastric landmarks were identified: the                            Z-line was found at 33 cm, the gastroesophageal                            junction was found at 33 cm and the upper extent of                            the gastric folds was found at 38 cm from the                            incisors.                           A 5 cm hiatal hernia was present.                           The exam of the esophagus was otherwise normal.                           A single linear erosion with no stigmata of recent  bleeding was found in the gastric fundus, just                            inferior to the hiatal hernia sac, associated with                            some mild erythema. Biopsies of the stomach were                            taken to rule out H pylori.                           Localized mucosal changes were found on the distal                            end of the greater curvature of the gastric body -                            appeared to be a localized flat polypoid area,                            roughly 1cm in diameter. Biopsies were taken with a                            cold forceps for histology.                           A few small sessile polyps were found in the                            gastric body. Grossly consistent with benign fundic                            gland polyps.                           The exam of the stomach was otherwise normal.                           The examined duodenum was normal. Complications:            No immediate  complications. Estimated blood loss:                            Minimal. Estimated Blood Loss:     Estimated blood loss was minimal. Impression:               - Esophagogastric landmarks identified.                           - 5 cm hiatal hernia.                           - Normal esophagus otherwise.                           -  Linear erosion in the fundus, just inferior to                            the hiatal hernia sac. No stigmata for bleeding                            noted. Biopsies of the stomach taken to rule out H                            pylori                           - Localized flat, possibly polypoid changes in the                            greater curvature of the gastric body. Biopsied.                           - A few gastric polyps. Likely fundic gland in the                            setting of PPI.                           - Normal examined duodenum.                           Overall, possible patient had some self limited                            bleeding from erosions inferior to hernia sac, no                            high risk stigmata for bleeding. No other pathology                            noted. BUN, Hgb were normal. No melena. Recommendation:           - Patient has a contact number available for                            emergencies. The signs and symptoms of potential                            delayed complications were discussed with the                            patient. Return to normal activities tomorrow.                            Written discharge instructions were provided to the                            patient.                           -  Resume previous diet.                           - Continue present medications.                           - Continue Dexilant.                           - Await pathology results.                           - Contact us if any recurrent symptoms concerning                            for  bleeding Viviann Spare P. Gordana Kewley, MD 08/19/2022 4:34:06 PM This report has been signed electronically.

## 2022-08-19 NOTE — Op Note (Signed)
[facility] Patient Name: Taylor Hopkins Procedure Date: 08/19/2022 4:34 PM MRN: [id number] Endoscopist: Viviann Spare P. Adela Lank , MD, [id number] Age: 72 Referring MD:  Date of Birth: [birth year] Gender: Female Account #: [id number] Procedure:                Colonoscopy Indications:              High risk colon cancer surveillance: Personal                            history of colonic polyps - last exam 2018 - 14mm                            SSP - of note Provation was down for this exam - NO                            PHOTOS were possible Medicines:                Monitored Anesthesia Care Procedure:                Pre-Anesthesia Assessment:                           - Prior to the procedure, a History and Physical                            was performed, and patient medications and                            allergies were reviewed. The patient's tolerance of                            previous anesthesia was also reviewed. The risks                            and benefits of the procedure and the sedation                            options and risks were discussed with the patient.                            All questions were answered, and informed consent                            was obtained. Prior Anticoagulants: The patient has                            taken no anticoagulant or antiplatelet agents. ASA                            Grade Assessment: II - A patient with mild systemic                            disease. After reviewing the risks and benefits,  the patient was deemed in satisfactory condition to                            undergo the procedure.                           After obtaining informed consent, the colonoscope                            was passed under direct vision. Throughout the                            procedure, the patient's blood pressure, pulse, and                            oxygen saturations were monitored  continuously. The                            PCF-HQ190L Colonoscope [id number] was introduced                            through the anus and advanced to the the cecum,                            identified by appendiceal orifice and ileocecal                            valve. The colonoscopy was performed without                            difficulty. The patient tolerated the procedure                            well. The quality of the bowel preparation was                            adequate. The ileocecal valve, appendiceal orifice,                            and rectum were photographed. Scope In: Scope Out: Findings:                 The perianal and digital rectal examinations were                            normal.                           A small amount of stool was found in the cecum,                            making visualization difficult around the AO.                            (residual vegetable matter / seeds made a roughly  2-3cm area of the cecal cap hard to visualize -                            nothing obviously seen there but could not lavage /                            clear it)                           A 4 mm polyp was found in the ascending colon. The                            polyp was sessile. The polyp was removed with a                            cold snare. Resection and retrieval were complete.                           An 8 mm polyp was found in the transverse colon.                            The polyp was flat. The polyp was removed with a                            cold snare. Resection and retrieval were complete.                           An 8 mm polyp was found in the transverse colon.                            The polyp was sessile. The polyp was removed with a                            cold snare. Resection and retrieval were complete.                           A few small-mouthed diverticula were found in the                             sigmoid colon.                           Internal hemorrhoids were found during retroflexion.                           The exam was otherwise without abnormality.                           Scope in: 1601                           Cecum reached: 1605  Scope out: 1623 Complications:            No immediate complications. Estimated blood loss:                            Minimal. Estimated Blood Loss:     Estimated blood loss was minimal. Impression:               - Small amoundt of residual vegetable matter /                            seeds in the in the cecal cap - prohibited                            visualization of a small area of the cecum.                           - One 4 mm polyp in the ascending colon, removed                            with a cold snare. Resected and retrieved.                           - One 8 mm polyp in the transverse colon, removed                            with a cold snare. Resected and retrieved.                           - One 8 mm polyp in the transverse colon, removed                            with a cold snare. Resected and retrieved.                           - Diverticulosis in the sigmoid colon.                           - Internal hemorrhoids. Recommendation:           - Patient has a contact number available for                            emergencies. The signs and symptoms of potential                            delayed complications were discussed with the                            patient. Return to normal activities tomorrow.                            Written discharge instructions were provided to the  patient.                           - Resume previous diet.                           - Continue present medications.                           - Await pathology results. Viviann Spare P. Henrick Mcgue, MD 08/19/2022 4:39:30 PM This report has been signed electronically.

## 2022-08-20 ENCOUNTER — Telehealth: Payer: Self-pay | Admitting: *Deleted

## 2022-08-20 ENCOUNTER — Encounter: Payer: Self-pay | Admitting: Family Medicine

## 2022-08-20 ENCOUNTER — Other Ambulatory Visit: Payer: Self-pay

## 2022-08-20 DIAGNOSIS — E781 Pure hyperglyceridemia: Secondary | ICD-10-CM

## 2022-08-20 DIAGNOSIS — E876 Hypokalemia: Secondary | ICD-10-CM

## 2022-08-20 MED ORDER — VITAMIN D (ERGOCALCIFEROL) 1.25 MG (50000 UNIT) PO CAPS: 50000.0000 [IU] | ORAL_CAPSULE | ORAL | 0 refills | Status: DC

## 2022-08-20 NOTE — Telephone Encounter (Signed)
Post procedure follow up call placed, no answer and left VM.  

## 2022-08-25 ENCOUNTER — Other Ambulatory Visit: Payer: Self-pay | Admitting: Family Medicine

## 2022-08-25 DIAGNOSIS — E785 Hyperlipidemia, unspecified: Secondary | ICD-10-CM

## 2022-09-23 ENCOUNTER — Telehealth: Payer: Self-pay | Admitting: Family Medicine

## 2022-09-23 NOTE — Telephone Encounter (Signed)
Patient states billing dept told Patient to contact office re: Invoice from [facility] 08/11/22. States she paid $ 30.00 at check in and Billing Department stated she owes 238.14-Patient's invoice states owes $208.14.   Invoice is not showing Patient paid $30.00 Copay.   Patient requests to be advised.

## 2022-09-24 ENCOUNTER — Encounter (INDEPENDENT_AMBULATORY_CARE_PROVIDER_SITE_OTHER): Payer: Self-pay

## 2022-09-30 ENCOUNTER — Other Ambulatory Visit: Payer: Self-pay | Admitting: Family Medicine

## 2022-10-01 NOTE — Telephone Encounter (Signed)
I have spoken with patient in regard. 

## 2022-10-09 ENCOUNTER — Ambulatory Visit (INDEPENDENT_AMBULATORY_CARE_PROVIDER_SITE_OTHER): Payer: Medicare Other | Admitting: Family Medicine

## 2022-10-09 ENCOUNTER — Encounter: Payer: Self-pay | Admitting: Family Medicine

## 2022-10-09 VITALS — BP 134/82 | HR 77 | Temp 98.1°F | Ht 69.0 in | Wt 189.2 lb

## 2022-10-09 DIAGNOSIS — K2100 Gastro-esophageal reflux disease with esophagitis, without bleeding: Secondary | ICD-10-CM | POA: Diagnosis not present

## 2022-10-09 DIAGNOSIS — K449 Diaphragmatic hernia without obstruction or gangrene: Secondary | ICD-10-CM | POA: Insufficient documentation

## 2022-10-09 DIAGNOSIS — F339 Major depressive disorder, recurrent, unspecified: Secondary | ICD-10-CM

## 2022-10-09 DIAGNOSIS — E781 Pure hyperglyceridemia: Secondary | ICD-10-CM | POA: Diagnosis not present

## 2022-10-09 DIAGNOSIS — D126 Benign neoplasm of colon, unspecified: Secondary | ICD-10-CM

## 2022-10-09 DIAGNOSIS — K922 Gastrointestinal hemorrhage, unspecified: Secondary | ICD-10-CM | POA: Insufficient documentation

## 2022-10-09 DIAGNOSIS — F5104 Psychophysiologic insomnia: Secondary | ICD-10-CM

## 2022-10-09 DIAGNOSIS — E559 Vitamin D deficiency, unspecified: Secondary | ICD-10-CM

## 2022-10-09 MED ORDER — BUPROPION HCL ER (SR) 150 MG PO TB12
150.0000 mg | ORAL_TABLET | Freq: Two times a day (BID) | ORAL | 3 refills | Status: DC
Start: 2022-10-09 — End: 2023-10-02

## 2022-10-09 NOTE — Progress Notes (Signed)
Subjective  CC:  Chief Complaint  Patient presents with   Depression   Anxiety    in   Insomnia    HPI: Taylor Hopkins is a 72 y.o. female who presents to the office today to address the problems listed above in the chief complaint. Follow-up depression and anxiety and insomnia: Fortunately doing better.  Klonopin has been helpful for sleep.  Taking half a tab nightly.  Not using it during the day and feeling mostly better.  Remains on Wellbutrin 75 twice daily.  She feels that this has been helpful.  She has been on this for a while.  Stressful situations include recent ED visit for coffee-ground emesis: Follow-up GI evaluation including EGD and colonoscopy.  I reviewed all consults and reports.  EGD did show some small hiatal erosions without any active bleeding and a hiatal hernia.  H. pylori biopsies were negative.  Colonoscopy showed adenomatous polyps.  She was recommended to continue on Dexilant.  She still has dyspepsia and bloating.  However no further episodes of bleeding.  No melena.  Also stressful home situation due to chronic back pain, has been needing surgery, and friend with dementia.  But overall she is improved. We reviewed lab work from last visit: Low vitamin D now on supplements, low potassium which resolved by follow-up lab work, and significant hypertriglyceridemia with triglycerides 800s.  She said she was fasting.  She is not fasting for recheck today.  She did restart her atorvastatin 10.  She is not taking it at that time.  Assessment  1. Major depression, recurrent, chronic (HCC)   2. Psychophysiological insomnia   3. Hypertriglyceridemia   4. Gastroesophageal reflux disease with esophagitis without hemorrhage   5. Hiatal hernia   6. Vitamin D deficiency   7. Adenomatous polyp of colon, unspecified part of colon   8. UGI bleed      Plan  Depression and insomnia: Improving.  Will continue Klonopin at night for sleep and increase Wellbutrin to 150 twice  daily.  Education given.  Recheck 6 weeks to 12 weeks Hypertriglyceridemia: Education given.  Recommend low-fat diet, Mega red Krill oil capsules daily and recheck fasting lipids in 6 to 12 weeks.  Discussed risks of pancreatitis if remains very elevated.  Continue atorvastatin 10 GERD and recent upper GI bleed: Stable.  Continue Dexilant.  Follow-up with GI if dyspepsia symptoms persist.  She has questions about the hernia. Adenomatous polyps: Recheck colonoscopy in 3 years Vitamin D deficiency: Now on supplements.  Will recheck at next visit  Follow up: 6 to 12 weeks, recheck fasting labs and mood and sleep Visit date not found  No orders of the defined types were placed in this encounter.  Meds ordered this encounter  Medications   buPROPion (WELLBUTRIN SR) 150 MG 12 hr tablet    Sig: Take 1 tablet (150 mg total) by mouth 2 (two) times daily.    Dispense:  180 tablet    Refill:  3      I reviewed the patients updated PMH, FH, and SocHx.    Patient Active Problem List   Diagnosis Date Noted   Adenomatous polyp of colon 07/04/2021    Priority: High   Lumbar radiculopathy, chronic 07/26/2019    Priority: High   Prediabetes 07/26/2019    Priority: High   Insomnia 07/04/2018    Priority: High   Osteoporosis 07/04/2018    Priority: High   Major depression, recurrent, chronic (HCC) 07/04/2018  Priority: High   OSA (obstructive sleep apnea), not on CPAP 07/04/2018    Priority: High   Urinary, incontinence, stress female 03/28/2020    Priority: Medium    Urinary incontinence, mixed 03/28/2020    Priority: Medium    Gallstones 07/26/2019    Priority: Medium    S/P dilatation of esophageal stricture 07/23/2018    Priority: Medium    Colon polyp 07/04/2018    Priority: Medium    IBS (irritable bowel syndrome) 07/04/2018    Priority: Medium    Chronic neck pain due to DJD, DDD, unable to tolerate NSAIDs 07/04/2018    Priority: Medium    GERD (gastroesophageal reflux  disease) 10/02/2008    Priority: Medium    Seasonal allergic rhinitis due to pollen 07/26/2019    Priority: Low   Vitamin D deficiency 07/04/2018    Priority: Low   UGI bleed 10/09/2022   Hiatal hernia 10/09/2022   Notalgia paresthetica 08/11/2022   Current Meds  Medication Sig   atorvastatin (LIPITOR) 10 MG tablet TAKE 1 TABLET BY MOUTH EVERYDAY AT BEDTIME   BIOTIN PO Take 3,000 mcg by mouth.   buPROPion (WELLBUTRIN SR) 150 MG 12 hr tablet Take 1 tablet (150 mg total) by mouth 2 (two) times daily.   clobetasol (TEMOVATE) 0.05 % external solution Apply 1 Application topically 2 (two) times daily.   clonazePAM (KLONOPIN) 1 MG tablet Take 1 tablet (1 mg total) by mouth 2 (two) times daily as needed for anxiety. (Patient taking differently: Take 1 mg by mouth 2 (two) times daily as needed for anxiety. Only taking .5mg )   dexlansoprazole (DEXILANT) 60 MG capsule Take 1 capsule (60 mg total) by mouth daily.   oxybutynin (DITROPAN XL) 10 MG 24 hr tablet Take 10 mg by mouth at bedtime.   PROLIA 60 MG/ML SOSY injection Inject 60 mg into the skin every 6 (six) months.   valACYclovir (VALTREX) 1000 MG tablet TAKE 2 TABLETS BY MOUTH 2 (TWO) TIMES DAILY. ONCE, AS NEEDED FOR COLD SORES   Vitamin D, Ergocalciferol, (DRISDOL) 1.25 MG (50000 UNIT) CAPS capsule Take 1 capsule (50,000 Units total) by mouth once a week.   [DISCONTINUED] buPROPion (WELLBUTRIN) 75 MG tablet Take 1 tablet (75 mg total) by mouth 2 (two) times daily.   [DISCONTINUED] busPIRone (BUSPAR) 7.5 MG tablet TAKE 1 TABLET BY MOUTH 3 TIMES  DAILY AS NEEDED    Allergies: Patient is allergic to fluoxetine, prozac [fluoxetine hcl], sulfonamide derivatives, adhesive  [tape], other, sulfa antibiotics, and wound dressing adhesive. Family History: Patient family history includes Allergies in her brother; Colon polyps in her father; Diabetes in her mother; Diverticulitis in her father; Esophageal cancer in her maternal grandmother; Heart attack  in her father; Heart failure in her father and mother; Kidney failure in her mother; Mitral valve prolapse in her mother; Stomach cancer in her paternal uncle. Social History:  Patient  reports that she has never smoked. She has never used smokeless tobacco. She reports current alcohol use. She reports that she does not use drugs.  Review of Systems: Constitutional: Negative for fever malaise or anorexia Cardiovascular: negative for chest pain Respiratory: negative for SOB or persistent cough Gastrointestinal: negative for abdominal pain  Objective  Vitals: BP 134/82   Pulse 77   Temp 98.1 F (36.7 C)   Ht 5\' 9"  (1.753 m)   Wt 189 lb 3.2 oz (85.8 kg)   SpO2 95%   BMI 27.94 kg/m  General: no acute distress , A&Ox3 HEENT:  PEERL, conjunctiva normal, neck is supple Cardiovascular:  RRR without murmur or gallop.  Respiratory:  Good breath sounds bilaterally, CTAB with normal respiratory effort Skin:  Warm, no rashes  Commons side effects, risks, benefits, and alternatives for medications and treatment plan prescribed today were discussed, and the patient expressed understanding of the given instructions. Patient is instructed to call or message via MyChart if he/she has any questions or concerns regarding our treatment plan. No barriers to understanding were identified. We discussed Red Flag symptoms and signs in detail. Patient expressed understanding regarding what to do in case of urgent or emergency type symptoms.  Medication list was reconciled, printed and provided to the patient in AVS. Patient instructions and summary information was reviewed with the patient as documented in the AVS. This note was prepared with assistance of Dragon voice recognition software. Occasional wrong-word or sound-a-like substitutions may have occurred due to the inherent limitations of voice recognition software

## 2022-10-09 NOTE — Patient Instructions (Signed)
Please return in 6-12 weeks for recheck, come fasting.   Start MegaRed krill oil capsules once daily.  Continue atorvastatin.  Eat a low fat diet.  Continue vit D.  We will recheck your lab work at your next visit   If you have any questions or concerns, please don't hesitate to send me a message via MyChart or call the office at [phone]. Thank you for visiting with [place] today! It's our pleasure caring for you.

## 2022-10-14 ENCOUNTER — Encounter: Payer: Self-pay | Admitting: Family Medicine

## 2022-10-14 ENCOUNTER — Ambulatory Visit: Payer: Medicare Other | Admitting: Nurse Practitioner

## 2022-10-15 ENCOUNTER — Other Ambulatory Visit: Payer: Self-pay | Admitting: Family

## 2022-10-15 MED ORDER — GABAPENTIN 300 MG PO CAPS: 300.0000 mg | ORAL_CAPSULE | Freq: Two times a day (BID) | ORAL | 0 refills | Status: DC

## 2022-10-15 NOTE — Telephone Encounter (Signed)
Please see pt msg and advise, pt last seen in office 10/09/22 and showing past Rx of Gabapentin 300mg .

## 2022-10-22 ENCOUNTER — Other Ambulatory Visit: Payer: Self-pay | Admitting: Family Medicine

## 2022-10-22 DIAGNOSIS — F5101 Primary insomnia: Secondary | ICD-10-CM

## 2022-11-05 DIAGNOSIS — M25552 Pain in left hip: Secondary | ICD-10-CM | POA: Insufficient documentation

## 2022-11-05 DIAGNOSIS — M25551 Pain in right hip: Secondary | ICD-10-CM | POA: Insufficient documentation

## 2022-11-18 ENCOUNTER — Telehealth: Payer: Self-pay | Admitting: Family Medicine

## 2022-11-18 ENCOUNTER — Other Ambulatory Visit: Payer: Self-pay

## 2022-11-18 DIAGNOSIS — Z13820 Encounter for screening for osteoporosis: Secondary | ICD-10-CM

## 2022-11-18 LAB — HM MAMMOGRAPHY

## 2022-11-18 NOTE — Telephone Encounter (Signed)
Patient is requesting PCP to place DEXA scan order to [facility]  on [address].

## 2022-11-19 ENCOUNTER — Encounter: Payer: Self-pay | Admitting: Family Medicine

## 2022-11-28 ENCOUNTER — Other Ambulatory Visit: Payer: Self-pay | Admitting: Family Medicine

## 2022-12-03 ENCOUNTER — Ambulatory Visit (INDEPENDENT_AMBULATORY_CARE_PROVIDER_SITE_OTHER): Payer: Medicare Other | Admitting: Family Medicine

## 2022-12-03 VITALS — BP 124/70 | HR 81 | Temp 97.7°F | Ht 69.0 in | Wt 180.0 lb

## 2022-12-03 DIAGNOSIS — E559 Vitamin D deficiency, unspecified: Secondary | ICD-10-CM | POA: Diagnosis not present

## 2022-12-03 DIAGNOSIS — E781 Pure hyperglyceridemia: Secondary | ICD-10-CM | POA: Diagnosis not present

## 2022-12-03 DIAGNOSIS — F339 Major depressive disorder, recurrent, unspecified: Secondary | ICD-10-CM

## 2022-12-03 DIAGNOSIS — F5104 Psychophysiologic insomnia: Secondary | ICD-10-CM | POA: Diagnosis not present

## 2022-12-03 LAB — COMPREHENSIVE METABOLIC PANEL
ALT: 17 U/L (ref 0–35)
AST: 22 U/L (ref 0–37)
Albumin: 4.1 g/dL (ref 3.5–5.2)
Alkaline Phosphatase: 24 U/L — ABNORMAL LOW (ref 39–117)
BUN: 20 mg/dL (ref 6–23)
CO2: 27 meq/L (ref 19–32)
Calcium: 9.2 mg/dL (ref 8.4–10.5)
Chloride: 102 meq/L (ref 96–112)
Creatinine, Ser: 1 mg/dL (ref 0.40–1.20)
GFR: 56.02 mL/min — ABNORMAL LOW (ref >60.00)
Glucose, Bld: 93 mg/dL (ref 70–99)
Potassium: 3.7 meq/L (ref 3.5–5.1)
Sodium: 140 meq/L (ref 135–145)
Total Bilirubin: 0.7 mg/dL (ref 0.2–1.2)
Total Protein: 6.7 g/dL (ref 6.0–8.3)

## 2022-12-03 LAB — LIPID PANEL
Cholesterol: 131 mg/dL (ref 0–200)
HDL: 42.5 mg/dL (ref >39.00)
LDL Cholesterol: 68 mg/dL (ref 0–99)
NonHDL: 88.99
Total CHOL/HDL Ratio: 3
Triglycerides: 106 mg/dL (ref 0.0–149.0)
VLDL: 21.2 mg/dL (ref 0.0–40.0)

## 2022-12-03 LAB — VITAMIN D 25 HYDROXY (VIT D DEFICIENCY, FRACTURES): VITD: 44.72 ng/mL (ref 30.00–100.00)

## 2022-12-03 MED ORDER — CLONAZEPAM 1 MG PO TABS: 0.5000 mg | ORAL_TABLET | Freq: Every evening | ORAL | Status: DC | PRN

## 2022-12-03 NOTE — Progress Notes (Signed)
Subjective  CC:  Chief Complaint  Patient presents with   Depression   Gastroesophageal Reflux   Medical Management of Chronic Issues    Pt here for f.u on new medication Clonazepam w/o any comcerns. Pt aware DEXA scan due    HPI: Taylor Hopkins is a 72 y.o. female who presents to the office today to address the problems listed above in the chief complaint. Follow-up depression and insomnia: Up until a week ago, she is doing much better on the Wellbutrin XR 150 twice daily and as needed 0.5 mg Klonopin for sleep.  Mood was brighter.  Less sleeping problems.  However with recent her came to [place] where her son lives, she has been quite upset.  Also her husband is scheduled for surgery and her dog was diagnosed with end-stage renal disease at age 51.  She is quite distraught today. Hypertriglyceridemia on Mega red supplements and atorvastatin fasting for recheck today.  Tolerating both Due for recheck of vitamin D deficiency on oral supplements.  Assessment  1. Major depression, recurrent, chronic (HCC)   2. Psychophysiological insomnia   3. Hypertriglyceridemia   4. Vitamin D deficiency      Plan  Major depression with acute adjustment reaction: Counseling done.  Multiple stressors.  Continue Wellbutrin XR and as needed Klonopin.  Follow-up if not improving.  Fortunately, medicines had been helping. Recheck lipids, fasting today Recheck vitamin D levels  Follow up: 6 months for recheck 04/27/2023  Orders Placed This Encounter  Procedures   Lipid panel   Comprehensive metabolic panel   VITAMIN D 25 Hydroxy (Vit-D Deficiency, Fractures)   Meds ordered this encounter  Medications   clonazePAM (KLONOPIN) 1 MG tablet    Sig: Take 0.5 tablets (0.5 mg total) by mouth at bedtime as needed for anxiety.      I reviewed the patients updated PMH, FH, and SocHx.    Patient Active Problem List   Diagnosis Date Noted   Adenomatous polyp of colon 07/04/2021     Priority: High   Lumbar radiculopathy, chronic 07/26/2019    Priority: High   Prediabetes 07/26/2019    Priority: High   Insomnia 07/04/2018    Priority: High   Osteoporosis 07/04/2018    Priority: High   Major depression, recurrent, chronic (HCC) 07/04/2018    Priority: High   OSA (obstructive sleep apnea), not on CPAP 07/04/2018    Priority: High   Urinary, incontinence, stress female 03/28/2020    Priority: Medium    Urinary incontinence, mixed 03/28/2020    Priority: Medium    Gallstones 07/26/2019    Priority: Medium    S/P dilatation of esophageal stricture 07/23/2018    Priority: Medium    Colon polyp 07/04/2018    Priority: Medium    IBS (irritable bowel syndrome) 07/04/2018    Priority: Medium    Chronic neck pain due to DJD, DDD, unable to tolerate NSAIDs 07/04/2018    Priority: Medium    GERD (gastroesophageal reflux disease) 10/02/2008    Priority: Medium    Seasonal allergic rhinitis due to pollen 07/26/2019    Priority: Low   Vitamin D deficiency 07/04/2018    Priority: Low   UGI bleed 10/09/2022   Hiatal hernia 10/09/2022   Notalgia paresthetica 08/11/2022   Current Meds  Medication Sig   atorvastatin (LIPITOR) 10 MG tablet TAKE 1 TABLET BY MOUTH EVERYDAY AT BEDTIME   BIOTIN PO Take 3,000 mcg by mouth.   buPROPion [facility] SR)  150 MG 12 hr tablet Take 1 tablet (150 mg total) by mouth 2 (two) times daily.   clobetasol (TEMOVATE) 0.05 % external solution Apply 1 Application topically 2 (two) times daily.   dexlansoprazole (DEXILANT) 60 MG capsule Take 1 capsule (60 mg total) by mouth daily.   gabapentin (NEURONTIN) 300 MG capsule Take 1 capsule (300 mg total) by mouth 2 (two) times daily. 1 tab daily x 14 days then increase to twice daily   oxybutynin (DITROPAN XL) 10 MG 24 hr tablet Take 10 mg by mouth at bedtime.   PROLIA 60 MG/ML SOSY injection Inject 60 mg into the skin every 6 (six) months.   traZODone (DESYREL) 50 MG tablet TAKE 0.5-1 TABLETS  (25-50 MG TOTAL) BY MOUTH AT BEDTIME AS NEEDED. FOR SLEEP   valACYclovir (VALTREX) 1000 MG tablet TAKE 2 TABLETS BY MOUTH 2 (TWO) TIMES DAILY. ONCE, AS NEEDED FOR COLD SORES   [DISCONTINUED] clonazePAM (KLONOPIN) 1 MG tablet Take 1 tablet (1 mg total) by mouth 2 (two) times daily as needed for anxiety. (Patient taking differently: Take 1 mg by mouth 2 (two) times daily as needed for anxiety. Only taking .5mg )    Allergies: Patient is allergic to fluoxetine, prozac [fluoxetine hcl], sulfonamide derivatives, adhesive  [tape], other, sulfa antibiotics, and wound dressing adhesive. Family History: Patient family history includes Allergies in her brother; Colon polyps in her father; Diabetes in her mother; Diverticulitis in her father; Esophageal cancer in her maternal grandmother; Heart attack in her father; Heart failure in her father and mother; Kidney failure in her mother; Mitral valve prolapse in her mother; Stomach cancer in her paternal uncle. Social History:  Patient  reports that she has never smoked. She has never used smokeless tobacco. She reports current alcohol use. She reports that she does not use drugs.  Review of Systems: Constitutional: Negative for fever malaise or anorexia Cardiovascular: negative for chest pain Respiratory: negative for SOB or persistent cough Gastrointestinal: negative for abdominal pain  Objective  Vitals: BP 124/70   Pulse 81   Temp 97.7 F (36.5 C)   Ht 5\' 9"  (1.753 m)   Wt 180 lb (81.6 kg)   SpO2 97%   BMI 26.58 kg/m  General: no acute distress , A&Ox3 Psych: Tearful  Last vitamin D Lab Results  Component Value Date   VD25OH 17.67 (L) 08/11/2022   Lab Results  Component Value Date   CHOL 227 (H) 08/11/2022   HDL 30.80 (L) 08/11/2022   LDLCALC 89 07/04/2021   LDLDIRECT 56.0 08/11/2022   TRIG (H) 08/11/2022    874.0 Triglyceride is over 400; calculations on Lipids are invalid.   CHOLHDL 7 08/11/2022    Commons side effects, risks,  benefits, and alternatives for medications and treatment plan prescribed today were discussed, and the patient expressed understanding of the given instructions. Patient is instructed to call or message via MyChart if he/she has any questions or concerns regarding our treatment plan. No barriers to understanding were identified. We discussed Red Flag symptoms and signs in detail. Patient expressed understanding regarding what to do in case of urgent or emergency type symptoms.  Medication list was reconciled, printed and provided to the patient in AVS. Patient instructions and summary information was reviewed with the patient as documented in the AVS. This note was prepared with assistance of Dragon voice recognition software. Occasional wrong-word or sound-a-like substitutions may have occurred due to the inherent limitations of voice recognition software

## 2022-12-03 NOTE — Telephone Encounter (Signed)
Pt called back and states the order for [facility] has not been received, please resend order.

## 2022-12-03 NOTE — Telephone Encounter (Signed)
Please fax to [phone]

## 2022-12-05 NOTE — Progress Notes (Signed)
See my chart note.

## 2022-12-05 NOTE — Telephone Encounter (Signed)
Pt stated still had not be received. Re-faxed again. Called to confirm with [facility]. Spoke with Byrd Hesselbach & its has been received. Advised pt to call & schedule.

## 2022-12-06 ENCOUNTER — Encounter: Payer: Self-pay | Admitting: Family Medicine

## 2022-12-09 ENCOUNTER — Encounter: Payer: Self-pay | Admitting: Family Medicine

## 2022-12-18 DIAGNOSIS — M545 Low back pain, unspecified: Secondary | ICD-10-CM | POA: Insufficient documentation

## 2022-12-23 ENCOUNTER — Telehealth: Payer: Self-pay | Admitting: Family Medicine

## 2022-12-23 NOTE — Telephone Encounter (Signed)
Pt states her dog passed away today and her husband is having surgery tomorrow and her nerves are shot - per pt - she would like to know if we can prescribe her something to calm her down or use a higher dose of what she is already taking. Please advise.

## 2023-01-01 DIAGNOSIS — M533 Sacrococcygeal disorders, not elsewhere classified: Secondary | ICD-10-CM | POA: Insufficient documentation

## 2023-01-02 ENCOUNTER — Other Ambulatory Visit: Payer: Self-pay | Admitting: Family Medicine

## 2023-01-02 LAB — HM DEXA SCAN

## 2023-01-05 ENCOUNTER — Encounter: Payer: Self-pay | Admitting: Family Medicine

## 2023-01-27 ENCOUNTER — Other Ambulatory Visit: Payer: Self-pay | Admitting: Family Medicine

## 2023-03-10 ENCOUNTER — Ambulatory Visit: Payer: Medicare Other | Admitting: Family Medicine

## 2023-03-31 ENCOUNTER — Ambulatory Visit: Payer: Medicare Other | Admitting: Podiatry

## 2023-03-31 ENCOUNTER — Ambulatory Visit (INDEPENDENT_AMBULATORY_CARE_PROVIDER_SITE_OTHER): Payer: Medicare Other

## 2023-03-31 DIAGNOSIS — M778 Other enthesopathies, not elsewhere classified: Secondary | ICD-10-CM

## 2023-03-31 DIAGNOSIS — M2011 Hallux valgus (acquired), right foot: Secondary | ICD-10-CM

## 2023-03-31 DIAGNOSIS — M2012 Hallux valgus (acquired), left foot: Secondary | ICD-10-CM | POA: Diagnosis not present

## 2023-03-31 DIAGNOSIS — M7672 Peroneal tendinitis, left leg: Secondary | ICD-10-CM

## 2023-03-31 NOTE — Progress Notes (Signed)
 Chief Complaint  Patient presents with   Foot Pain    LT: has a knot on the outer side of her foot. Painful. Ongoing for about a year. Has not tried anything to alleviate symptoms.  RT: has same area as LT.    Foot Injury    Occurred last year around July. Seen by Dr. Beverley for this injury. They did perform XR.    HPI: 73 y.o. female presents today with pain along the outside of the left midfoot.  She states that it feels like there is a knot there.  States that has been ongoing for a long time.  She also has secondary concern of bunions and her great toe shifting towards the second toe.  Past Medical History:  Diagnosis Date   Adenomatous colon polyp 06/2003   Allergic rhinitis    Anxiety    Arthritis    Cataract 2010   bilateral eyes   COVID-19    Depression    Gallstones 07/26/2019   asymtomatic   GERD (gastroesophageal reflux disease)    Sleep apnea    Not currently using CPAP - last sleep study 2016    Past Surgical History:  Procedure Laterality Date   ANKLE SURGERY Right    BREAST ENHANCEMENT SURGERY     COLONOSCOPY     LUMBAR DISC SURGERY     TONSILLECTOMY     TOTAL ABDOMINAL HYSTERECTOMY     UPPER GASTROINTESTINAL ENDOSCOPY      Allergies  Allergen Reactions   Fluoxetine  Itching   Prozac  [Fluoxetine  Hcl] Itching   Sulfonamide Derivatives    Adhesive  [Tape] Rash   Other Rash   Sulfa Antibiotics Rash   Wound Dressing Adhesive Rash    Physical Exam: General: The patient is alert and oriented x3 in no acute distress.  Dermatology: Skin is warm, dry and supple bilateral lower extremities. Interspaces are clear of maceration and debris.    Vascular: Palpable pedal pulses bilaterally. Capillary refill within normal limits.  No appreciable edema.  No erythema or calor.  Neurological: Light touch sensation grossly intact bilateral feet.   Musculoskeletal Exam: There is lateral deviation of the hallux bilateral with bony prominence on the medial first  metatarsal head.  There is decreased first MPJ range of motion without crepitus.  There is no pain on palpation of the bump today.  The hallux is reducible.  There is pain on palpation of the peroneus brevis at its insertion into the fifth metatarsal base on the left foot.  No gaps or nodules are noted within the peroneus brevis tendon.  There is minimal localized edema.  No erythema or ecchymosis.  Radiographic Exam (bilateral foot, 3 weightbearing views, 03/31/2023):  Left foot: There is accessory ossicle at the plantar lateral aspect of the cuboid.  No fracture is seen.  There is increase in first intermetatarsal angle with tibial sesamoid position of 6 with partially subluxed hallux laterally at the MPJ level.  Increased abductus angle.  There is contracture in the second toe at the PIPJ Right foot: There is increased first intermetatarsal angle but it is less severe than the left foot.  Tibial sesamoid position is 5.  There is increased hallux abductus angle.  No fracture seen.  PIPJ contracture with medial angulation of all lesser toes.  Assessment/Plan of Care: 1. Peroneus brevis tendinitis, left   2. Capsulitis of foot   3. Acquired hallux valgus of left foot   4. Acquired hallux valgus of right foot  Discussed clinical and radiographic findings with patient today.  With the patient's consent a corticosteroid injection was administered to the insertion point of the peroneus brevis at the fifth metatarsal base on the left foot.  This consisted of a mixture of 1% Xylocaine  plain, 0.5% Marcaine  plain, and Kenalog  10 for total of 1.25 cc administered.  She tolerated this well and a Band-Aid was applied.  She can remove the Band-Aid tonight.  Discussed shoe gear and other possible aggravating factors.  She may also rest, ice, compress the area as needed.  Also reviewed the x-rays and discussed her bunions and hallux valgus deformities.  She was fitted for & dispensed bilateral nighttime bunion  strapping/splints and instructed on how to wear these, and first interspace spacers to hold the great toe in better position to help prevent progression of deformity.  Briefly discussed surgical intervention if conservative measures for the bunion and hallux valgus deformities are not successful and if any pain develops or continues.  Follow-up as needed   Awanda CHARM Imperial, DPM, FACFAS [facility]     [address] [place], [place] [address]                Office [phone]  Fax [phone]

## 2023-04-04 ENCOUNTER — Other Ambulatory Visit: Payer: Self-pay | Admitting: Family Medicine

## 2023-04-29 ENCOUNTER — Ambulatory Visit: Payer: Medicare Other

## 2023-04-29 VITALS — Ht 66.0 in | Wt 180.0 lb

## 2023-04-29 DIAGNOSIS — Z0000 Encounter for general adult medical examination without abnormal findings: Secondary | ICD-10-CM | POA: Diagnosis not present

## 2023-04-29 NOTE — Patient Instructions (Signed)
 Ms. Sacca , Thank you for taking time to come for your Medicare Wellness Visit. I appreciate your ongoing commitment to your health goals. Please review the following plan we discussed and let me know if I can assist you in the future.   Referrals/Orders/Follow-Ups/Clinician Recommendations: Aim for 30 minutes of exercise or brisk walking, 6-8 glasses of water, and 5 servings of fruits and vegetables each day.   This is a list of the screening recommended for you and due dates:  Health Maintenance  Topic Date Due   COVID-19 Vaccine (4 - 2024-25 season) 10/26/2022   Flu Shot  05/25/2023*   Mammogram  11/18/2023   Medicare Annual Wellness Visit  04/28/2024   DEXA scan (bone density measurement)  01/01/2025   Colon Cancer Screening  08/18/2025   Pneumonia Vaccine  Completed   Hepatitis C Screening  Completed   Zoster (Shingles) Vaccine  Completed   HPV Vaccine  Aged Out   DTaP/Tdap/Td vaccine  Discontinued  *Topic was postponed. The date shown is not the original due date.    Advanced directives: (Declined) Advance directive discussed with you today. Even though you declined this today, please call our office should you change your mind, and we can give you the proper paperwork for you to fill out.  Next Medicare Annual Wellness Visit scheduled for next year: Yes

## 2023-04-29 NOTE — Progress Notes (Addendum)
 Subjective:   Taylor Hopkins is a 73 y.o. who presents for a Medicare Wellness preventive visit.  Visit Complete: Virtual I connected with  Taylor Hopkins on 04/29/23 by a audio enabled telemedicine application and verified that I am speaking with the correct person using two identifiers.  Patient Location: Home  Provider Location: Home Office  I discussed the limitations of evaluation and management by telemedicine. The patient expressed understanding and agreed to proceed.  Vital Signs: Because this visit was a virtual/telehealth visit, some criteria may be missing or patient reported. Any vitals not documented were not able to be obtained and vitals that have been documented are patient reported.  VideoDeclined- This patient declined Librarian, academic. Therefore the visit was completed with audio only.  AWV Questionnaire: No: Patient Medicare AWV questionnaire was not completed prior to this visit.  Cardiac Risk Factors include: advanced age (>87men, >8 women)     Objective:    Today's Vitals   04/29/23 0836 04/29/23 0837  Weight: 180 lb (81.6 kg)   Height: 5\' 6"  (1.676 m)   PainSc:  5    Body mass index is 29.05 kg/m.     04/29/2023    8:51 AM 08/12/2022   11:10 AM 04/14/2022   10:58 AM 03/28/2021    8:53 AM 01/31/2019   11:16 AM 11/19/2018    3:54 PM 10/18/2018   10:22 AM  Advanced Directives  Does Patient Have a Medical Advance Directive? No No No Yes Yes Yes Yes  Type of Network engineer Power of [employer] Power of Attorney  Does patient want to make changes to medical advance directive?     No - Patient declined No - Patient declined No - Patient declined  Copy of Healthcare Power of Attorney in Chart?    No - copy requested Yes - validated most recent copy scanned in chart (See row information) No - copy requested   Would patient like information on  creating a medical advance directive? No - Patient declined No - Patient declined No - Patient declined        Current Medications (verified) Outpatient Encounter Medications as of 04/29/2023  Medication Sig   atorvastatin (LIPITOR) 10 MG tablet TAKE 1 TABLET BY MOUTH EVERYDAY AT BEDTIME   BIOTIN PO Take 3,000 mcg by mouth.   buPROPion (WELLBUTRIN SR) 150 MG 12 hr tablet Take 1 tablet (150 mg total) by mouth 2 (two) times daily.   busPIRone (BUSPAR) 7.5 MG tablet Take 7.5 mg by mouth 3 (three) times daily as needed.   clobetasol (TEMOVATE) 0.05 % external solution Apply 1 Application topically 2 (two) times daily.   clonazePAM (KLONOPIN) 1 MG tablet Take 0.5-1 tablets (0.5-1 mg total) by mouth 2 (two) times daily as needed for anxiety.   dexlansoprazole (DEXILANT) 60 MG capsule Take 1 capsule (60 mg total) by mouth daily.   gabapentin (NEURONTIN) 300 MG capsule Take 1 capsule (300 mg total) by mouth 2 (two) times daily. 1 tab daily x 14 days then increase to twice daily   PROLIA 60 MG/ML SOSY injection Inject 60 mg into the skin every 6 (six) months.   traZODone (DESYREL) 50 MG tablet TAKE 0.5-1 TABLETS (25-50 MG TOTAL) BY MOUTH AT BEDTIME AS NEEDED. FOR SLEEP   valACYclovir (VALTREX) 1000 MG tablet TAKE 2 TABLETS BY MOUTH 2 (TWO) TIMES DAILY. ONCE, AS NEEDED FOR COLD SORES  Vitamin D, Ergocalciferol, (DRISDOL) 1.25 MG (50000 UNIT) CAPS capsule TAKE 1 CAPSULE BY MOUTH ONE TIME PER WEEK   oxybutynin (DITROPAN XL) 10 MG 24 hr tablet Take 10 mg by mouth at bedtime. (Patient not taking: Reported on 04/29/2023)   No facility-administered encounter medications on file as of 04/29/2023.    Allergies (verified) Fluoxetine, Prozac [fluoxetine hcl], Sulfonamide derivatives, Adhesive  [tape], Other, Sulfa antibiotics, and Wound dressing adhesive   History: Past Medical History:  Diagnosis Date   Adenomatous colon polyp 06/2003   Allergic rhinitis    Anxiety    Arthritis    Cataract 2010   bilateral  eyes   COVID-19    Depression    Gallstones 07/26/2019   asymtomatic   GERD (gastroesophageal reflux disease)    Sleep apnea    Not currently using CPAP - last sleep study 2016   Past Surgical History:  Procedure Laterality Date   ANKLE SURGERY Right    BREAST ENHANCEMENT SURGERY     COLONOSCOPY     LUMBAR DISC SURGERY     TONSILLECTOMY     TOTAL ABDOMINAL HYSTERECTOMY     UPPER GASTROINTESTINAL ENDOSCOPY     Family History  Problem Relation Age of Onset   Allergies Brother    Heart attack Father    Colon polyps Father    Diverticulitis Father    Heart failure Father    Esophageal cancer Maternal Grandmother    Heart failure Mother    Mitral valve prolapse Mother    Diabetes Mother    Kidney failure Mother    Stomach cancer Paternal Uncle    Colon cancer Neg Hx    Rectal cancer Neg Hx    Social History   Socioeconomic History   Marital status: Married    Spouse name: Aurther Loft   Number of children: 2   Years of education: Not on file   Highest education level: Not on file  Occupational History    Employer: RETIRED    Comment: Engineer  Tobacco Use   Smoking status: Never   Smokeless tobacco: Never  Vaping Use   Vaping status: Never Used  Substance and Sexual Activity   Alcohol use: Yes    Alcohol/week: 0.0 standard drinks of alcohol    Comment: 1 per month   Drug use: No   Sexual activity: Yes    Birth control/protection: Post-menopausal  Other Topics Concern   Not on file  Social History Narrative   Not on file   Social Drivers of Health   Financial Resource Strain: Low Risk  (04/29/2023)   Overall Financial Resource Strain (CARDIA)    Difficulty of Paying Living Expenses: Not hard at all  Food Insecurity: No Food Insecurity (04/29/2023)   Hunger Vital Sign    Worried About Running Out of Food in the Last Year: Never true    Ran Out of Food in the Last Year: Never true  Transportation Needs: No Transportation Needs (04/29/2023)   PRAPARE -  Administrator, Civil Service (Medical): No    Lack of Transportation (Non-Medical): No  Physical Activity: Insufficiently Active (04/29/2023)   Exercise Vital Sign    Days of Exercise per Week: 7 days    Minutes of Exercise per Session: 20 min  Stress: Stress Concern Present (04/29/2023)   [employer] of Occupational Health - Occupational Stress Questionnaire    Feeling of Stress : Rather much  Social Connections: Socially Integrated (04/29/2023)   Social Connection and Isolation  Panel [NHANES]    Frequency of Communication with Friends and Family: Three times a week    Frequency of Social Gatherings with Friends and Family: Three times a week    Attends Religious Services: More than 4 times per year    Active Member of Clubs or Organizations: Yes    Attends Banker Meetings: 1 to 4 times per year    Marital Status: Married    Tobacco Counseling Counseling given: Not Answered    Clinical Intake:  Pre-visit preparation completed: Yes  Pain : 0-10 Pain Score: 5  Pain Type: Chronic pain Pain Location: Generalized Pain Descriptors / Indicators: Constant Pain Onset: More than a month ago Pain Frequency: Constant     BMI - recorded: 29.05 Nutritional Status: BMI 25 -29 Overweight Nutritional Risks: None Diabetes: No  How often do you need to have someone help you when you read instructions, pamphlets, or other written materials from your doctor or pharmacy?: 1 - Never  Interpreter Needed?: No  Information entered by :: Lanier Ensign, LPN   Activities of Daily Living     04/29/2023    8:39 AM  In your present state of health, do you have any difficulty performing the following activities:  Hearing? 1  Comment slight HOH  Vision? 0  Difficulty concentrating or making decisions? 0  Walking or climbing stairs? 1  Comment at times  Dressing or bathing? 0  Doing errands, shopping? 0  Preparing Food and eating ? N  Using the Toilet? N  In  the past six months, have you accidently leaked urine? Y  Comment wears a pad  Do you have problems with loss of bowel control? N  Managing your Medications? N  Managing your Finances? N  Housekeeping or managing your Housekeeping? N    Patient Care Team: Willow Ora, MD as PCP - General (Family Medicine) Ginette Otto, [facility] as Consulting Physician (Gynecology) Pati Gallo, MD as Consulting Physician (Sports Medicine) Meryl Dare, MD (Inactive) as Consulting Physician (Gastroenterology) Tyrell Antonio, MD as Consulting Physician (Physical Medicine and Rehabilitation)  Indicate any recent Medical Services you may have received from other than [facility] providers in the past year (date may be approximate).     Assessment:   This is a routine wellness examination for [place].  Hearing/Vision screen Hearing Screening - Comments:: Pt stated HOH  Vision Screening - Comments:: Pt follows up with [place] [facility] for annual eye exams    Goals Addressed             This Visit's Progress    Patient Stated       Continue to lose weight        Depression Screen     04/29/2023    8:48 AM 12/03/2022    8:00 AM 12/03/2022    7:56 AM 10/09/2022   10:15 AM 10/09/2022   10:14 AM 08/11/2022    8:23 AM 04/14/2022   10:56 AM  PHQ 2/9 Scores  PHQ - 2 Score 2 4 4 2 1 2 1   PHQ- 9 Score 4 8 8 9 5 10 2     Fall Risk     04/29/2023    8:51 AM 12/03/2022    7:55 AM 10/09/2022   10:14 AM 08/11/2022    8:22 AM 04/14/2022   10:59 AM  Fall Risk   Falls in the past year? 0 0 0 1 1  Number falls in past yr: 0 0 0 1 1  Injury with Fall? 0 0 0 1 1  Comment    Fractured  ankle fractured right ankle 08/27/21  Risk for fall due to : No Fall Risks No Fall Risks No Fall Risks History of fall(s) Impaired vision  Follow up Falls prevention discussed;Falls evaluation completed Falls evaluation completed Falls evaluation completed Falls evaluation completed Falls prevention discussed     MEDICARE RISK AT HOME:  Medicare Risk at Home Any stairs in or around the home?: Yes If so, are there any without handrails?: No Home free of loose throw rugs in walkways, pet beds, electrical cords, etc?: Yes Adequate lighting in your home to reduce risk of falls?: Yes Life alert?: No Use of a cane, walker or w/c?: No Grab bars in the bathroom?: No Shower chair or bench in shower?: Yes Elevated toilet seat or a handicapped toilet?: No  TIMED UP AND GO:  Was the test performed?  No  Cognitive Function: 6CIT completed        04/29/2023    8:52 AM 04/14/2022   11:00 AM 03/28/2021    8:57 AM  6CIT Screen  What Year? 0 points 0 points 0 points  What month? 0 points 0 points 0 points  What time? 0 points 0 points 0 points  Count back from 20 0 points 0 points 0 points  Months in reverse 0 points 0 points 0 points  Repeat phrase 0 points 0 points 0 points  Total Score 0 points 0 points 0 points    Immunizations Immunization History  Administered Date(s) Administered   Fluad Quad(high Dose 65+) 11/19/2018   PFIZER(Purple Top)SARS-COV-2 Vaccination 06/03/2019, 06/24/2019, 03/24/2020   Pneumococcal Conjugate-13 03/28/2020   Pneumococcal Polysaccharide-23 11/19/2018   Zoster Recombinant(Shingrix) 08/22/2022, 11/30/2022    Screening Tests Health Maintenance  Topic Date Due   COVID-19 Vaccine (4 - 2024-25 season) 10/26/2022   INFLUENZA VACCINE  05/25/2023 (Originally 09/25/2022)   MAMMOGRAM  11/18/2023   Medicare Annual Wellness (AWV)  04/28/2024   DEXA SCAN  01/01/2025   Colonoscopy  08/18/2025   Pneumonia Vaccine 11+ Years old  Completed   Hepatitis C Screening  Completed   Zoster Vaccines- Shingrix  Completed   HPV VACCINES  Aged Out   DTaP/Tdap/Td  Discontinued    Health Maintenance  Health Maintenance Due  Topic Date Due   COVID-19 Vaccine (4 - 2024-25 season) 10/26/2022   Health Maintenance Items Addressed: See Nurse Notes  Additional Screening:  Vision  Screening: Recommended annual ophthalmology exams for early detection of glaucoma and other disorders of the eye.  Dental Screening: Recommended annual dental exams for proper oral hygiene  Community Resource Referral / Chronic Care Management: CRR required this visit?  No   CCM required this visit?  No     Plan:     I have personally reviewed and noted the following in the patient's chart:   Medical and social history Use of alcohol, tobacco or illicit drugs  Current medications and supplements including opioid prescriptions. Patient is not currently taking opioid prescriptions. Functional ability and status Nutritional status Physical activity Advanced directives List of other physicians Hospitalizations, surgeries, and ER visits in previous 12 months Vitals Screenings to include cognitive, depression, and falls Referrals and appointments  In addition, I have reviewed and discussed with patient certain preventive protocols, quality metrics, and best practice recommendations. A written personalized care plan for preventive services as well as general preventive health recommendations were provided to patient.     Marzella Schlein, LPN  04/29/2023   After Visit Summary: (MyChart) Due to this being a telephonic visit, the after visit summary with patients personalized plan was offered to patient via MyChart   Notes: Nothing significant to report at this time.

## 2023-05-01 ENCOUNTER — Telehealth: Admitting: Family Medicine

## 2023-05-01 ENCOUNTER — Encounter: Payer: Self-pay | Admitting: Family Medicine

## 2023-05-01 VITALS — Ht 66.0 in | Wt 180.0 lb

## 2023-05-01 DIAGNOSIS — J0110 Acute frontal sinusitis, unspecified: Secondary | ICD-10-CM | POA: Diagnosis not present

## 2023-05-01 MED ORDER — DOXYCYCLINE HYCLATE 100 MG PO TABS: 100.0000 mg | ORAL_TABLET | Freq: Two times a day (BID) | ORAL | 0 refills | Status: DC

## 2023-05-01 MED ORDER — GUAIFENESIN-CODEINE 100-10 MG/5ML PO SOLN: 5.0000 mL | Freq: Four times a day (QID) | ORAL | 0 refills | Status: DC | PRN

## 2023-05-01 NOTE — Progress Notes (Signed)
 Virtual Visit via Video Note  Subjective  CC:  Chief Complaint  Patient presents with   Cough    Coughing for a month. OTCs not working    I connected with Taylor Hopkins on 05/01/23 at 11:00 AM EST by a video enabled telemedicine application and verified that I am speaking with the correct person using two identifiers. Location patient: Home Location provider: [facility] [address], Office Persons participating in the virtual visit: SHERRICA NIEHAUS, Willow Ora, MD Trudie Reed CMA  I discussed the limitations of evaluation and management by telemedicine and the availability of in person appointments. The patient expressed understanding and agreed to proceed. HPI: Taylor Hopkins is a 73 y.o. female who was contacted today to address the problems listed above in the chief complaint. Discussed the use of AI scribe software for clinical note transcription with the patient, who gave verbal consent to proceed.  History of Present Illness   She presents with a persistent cough and chest congestion.  She has been experiencing a persistent cough and chest congestion for a couple of months, which is significant enough to disrupt her sleep at night. No sputum production is noted with the cough.  In addition to the cough and chest congestion, she has a sore throat, nasal congestion, and fatigue. She denies having a fever but mentions experiencing headaches, which she attributes to sinus pain.  She has been taking Mucinex cough syrup and tablets without resolution of her symptoms. A COVID test was negative. She has not used Robitussin with codeine in the past but is open to trying it for her persistent cough.      Assessment  1. Subacute frontal sinusitis      Plan  Assessment and Plan    sinusitis Persistent cough, chest congestion, and sinus pain for several months. No fever or productive cough. Negative COVID-19 test. Current treatment with  Mucinex cough syrup and tablets has not resolved symptoms. -Prescribe Doxycycline 100mg  twice daily for 10 days to cover potential sinus infection or bronchitis. -Prescribe Robitussin with Codeine for nighttime use to manage cough.  Follow-up in April or sooner if symptoms do not improve.       I discussed the assessment and treatment plan with the patient. The patient was provided an opportunity to ask questions and all were answered. The patient agreed with the plan and demonstrated an understanding of the instructions.   The patient was advised to call back or seek an in-person evaluation if the symptoms worsen or if the condition fails to improve as anticipated. Follow up:  06/03/2023  Meds ordered this encounter  Medications   doxycycline (VIBRA-TABS) 100 MG tablet    Sig: Take 1 tablet (100 mg total) by mouth 2 (two) times daily for 10 days.    Dispense:  20 tablet    Refill:  0   guaiFENesin-codeine 100-10 MG/5ML syrup    Sig: Take 5 mLs by mouth every 6 (six) hours as needed for cough.    Dispense:  120 mL    Refill:  0      I reviewed the patients updated PMH, FH, and SocHx.    Patient Active Problem List   Diagnosis Date Noted   Adenomatous polyp of colon 07/04/2021    Priority: High   Lumbar radiculopathy, chronic 07/26/2019    Priority: High   Prediabetes 07/26/2019    Priority: High   Insomnia 07/04/2018    Priority:  High   Osteoporosis 07/04/2018    Priority: High   Major depression, recurrent, chronic (HCC) 07/04/2018    Priority: High   OSA (obstructive sleep apnea), not on CPAP 07/04/2018    Priority: High   Urinary, incontinence, stress female 03/28/2020    Priority: Medium    Urinary incontinence, mixed 03/28/2020    Priority: Medium    Gallstones 07/26/2019    Priority: Medium    S/P dilatation of esophageal stricture 07/23/2018    Priority: Medium    Colon polyp 07/04/2018    Priority: Medium    IBS (irritable bowel syndrome) 07/04/2018     Priority: Medium    Chronic neck pain due to DJD, DDD, unable to tolerate NSAIDs 07/04/2018    Priority: Medium    GERD (gastroesophageal reflux disease) 10/02/2008    Priority: Medium    Seasonal allergic rhinitis due to pollen 07/26/2019    Priority: Low   Vitamin D deficiency 07/04/2018    Priority: Low   UGI bleed 10/09/2022   Hiatal hernia 10/09/2022   Notalgia paresthetica 08/11/2022   Current Meds  Medication Sig   atorvastatin (LIPITOR) 10 MG tablet TAKE 1 TABLET BY MOUTH EVERYDAY AT BEDTIME   BIOTIN PO Take 3,000 mcg by mouth.   buPROPion (WELLBUTRIN SR) 150 MG 12 hr tablet Take 1 tablet (150 mg total) by mouth 2 (two) times daily.   busPIRone (BUSPAR) 7.5 MG tablet Take 7.5 mg by mouth 3 (three) times daily as needed.   clobetasol (TEMOVATE) 0.05 % external solution Apply 1 Application topically 2 (two) times daily.   clonazePAM (KLONOPIN) 1 MG tablet Take 0.5-1 tablets (0.5-1 mg total) by mouth 2 (two) times daily as needed for anxiety.   dexlansoprazole (DEXILANT) 60 MG capsule Take 1 capsule (60 mg total) by mouth daily.   doxycycline (VIBRA-TABS) 100 MG tablet Take 1 tablet (100 mg total) by mouth 2 (two) times daily for 10 days.   gabapentin (NEURONTIN) 300 MG capsule Take 1 capsule (300 mg total) by mouth 2 (two) times daily. 1 tab daily x 14 days then increase to twice daily   guaiFENesin-codeine 100-10 MG/5ML syrup Take 5 mLs by mouth every 6 (six) hours as needed for cough.   oxybutynin (DITROPAN XL) 10 MG 24 hr tablet Take 10 mg by mouth at bedtime.   PROLIA 60 MG/ML SOSY injection Inject 60 mg into the skin every 6 (six) months.   traZODone (DESYREL) 50 MG tablet TAKE 0.5-1 TABLETS (25-50 MG TOTAL) BY MOUTH AT BEDTIME AS NEEDED. FOR SLEEP   valACYclovir (VALTREX) 1000 MG tablet TAKE 2 TABLETS BY MOUTH 2 (TWO) TIMES DAILY. ONCE, AS NEEDED FOR COLD SORES   Vitamin D, Ergocalciferol, (DRISDOL) 1.25 MG (50000 UNIT) CAPS capsule TAKE 1 CAPSULE BY MOUTH ONE TIME PER WEEK     Allergies: Patient is allergic to fluoxetine, prozac [fluoxetine hcl], sulfonamide derivatives, adhesive  [tape], other, sulfa antibiotics, and wound dressing adhesive. Family History: Patient family history includes Allergies in her brother; Colon polyps in her father; Diabetes in her mother; Diverticulitis in her father; Esophageal cancer in her maternal grandmother; Heart attack in her father; Heart failure in her father and mother; Kidney failure in her mother; Mitral valve prolapse in her mother; Stomach cancer in her paternal uncle. Social History:  Patient  reports that she has never smoked. She has never used smokeless tobacco. She reports current alcohol use. She reports that she does not use drugs.  Review of Systems: Constitutional: Negative for  fever malaise or anorexia Cardiovascular: negative for chest pain Respiratory: negative for SOB or persistent cough Gastrointestinal: negative for abdominal pain  OBJECTIVE Vitals: Ht 5\' 6"  (1.676 m)   Wt 180 lb (81.6 kg)   BMI 29.05 kg/m  General: no acute distress , A&Ox3  Willow Ora, MD

## 2023-05-08 ENCOUNTER — Other Ambulatory Visit: Payer: Self-pay | Admitting: Family Medicine

## 2023-05-08 NOTE — Telephone Encounter (Signed)
 12/03/2022 LOV  01/02/2023 fill date  60/2 refills

## 2023-05-08 NOTE — Telephone Encounter (Signed)
 Copied from CRM [id number]. Topic: Clinical - Medication Refill >> May 08, 2023  3:18 PM [place] A wrote: Most Recent Primary Care Visit:  Provider: Willow Ora  Department: [facility]  Visit Type: OFFICE VISIT  Date: 05/01/2023  Medication:  clonazePAM (KLONOPIN) 1 MG tablet   Has the patient contacted their pharmacy? Yes (Agent: If no, request that the patient contact the pharmacy for the refill. If patient does not wish to contact the pharmacy document the reason why and proceed with request.) (Agent: If yes, when and what did the pharmacy advise?) Call Primary  Is this the correct pharmacy for this prescription? Yes If no, delete pharmacy and type the correct one.  This is the patient's preferred pharmacy:  [facility] [address], [place] - [address] [place] [address] Phone: [phone] Fax: [phone]  [facility] - [place], [place] - [phone] [address] [place] [address] Phone: [phone] Fax: [phone]   Has the prescription been filled recently? No  Is the patient out of the medication? Yes  Has the patient been seen for an appointment in the last year OR does the patient have an upcoming appointment? Yes  Can we respond through MyChart? Yes  Agent: Please be advised that Rx refills may take up to 3 business days. We ask that you follow-up with your pharmacy.

## 2023-05-11 ENCOUNTER — Telehealth: Payer: Self-pay

## 2023-05-11 ENCOUNTER — Encounter: Payer: Self-pay | Admitting: Family Medicine

## 2023-05-11 ENCOUNTER — Ambulatory Visit: Admitting: Family Medicine

## 2023-05-11 VITALS — BP 124/77 | HR 74 | Temp 97.2°F | Ht 66.0 in | Wt 185.6 lb

## 2023-05-11 DIAGNOSIS — Z0001 Encounter for general adult medical examination with abnormal findings: Secondary | ICD-10-CM | POA: Diagnosis not present

## 2023-05-11 DIAGNOSIS — F339 Major depressive disorder, recurrent, unspecified: Secondary | ICD-10-CM

## 2023-05-11 DIAGNOSIS — E782 Mixed hyperlipidemia: Secondary | ICD-10-CM

## 2023-05-11 DIAGNOSIS — G629 Polyneuropathy, unspecified: Secondary | ICD-10-CM | POA: Diagnosis not present

## 2023-05-11 DIAGNOSIS — M810 Age-related osteoporosis without current pathological fracture: Secondary | ICD-10-CM | POA: Diagnosis not present

## 2023-05-11 DIAGNOSIS — R7303 Prediabetes: Secondary | ICD-10-CM

## 2023-05-11 DIAGNOSIS — E559 Vitamin D deficiency, unspecified: Secondary | ICD-10-CM

## 2023-05-11 DIAGNOSIS — K2100 Gastro-esophageal reflux disease with esophagitis, without bleeding: Secondary | ICD-10-CM

## 2023-05-11 LAB — HEMOGLOBIN A1C: Hgb A1c MFr Bld: 5.8 % (ref 4.6–6.5)

## 2023-05-11 LAB — COMPREHENSIVE METABOLIC PANEL
ALT: 16 U/L (ref 0–35)
AST: 23 U/L (ref 0–37)
Albumin: 4.1 g/dL (ref 3.5–5.2)
Alkaline Phosphatase: 35 U/L — ABNORMAL LOW (ref 39–117)
BUN: 12 mg/dL (ref 6–23)
CO2: 28 meq/L (ref 19–32)
Calcium: 9.3 mg/dL (ref 8.4–10.5)
Chloride: 107 meq/L (ref 96–112)
Creatinine, Ser: 0.83 mg/dL (ref 0.40–1.20)
GFR: 69.85 mL/min (ref >60.00)
Glucose, Bld: 107 mg/dL — ABNORMAL HIGH (ref 70–99)
Potassium: 3.5 meq/L (ref 3.5–5.1)
Sodium: 144 meq/L (ref 135–145)
Total Bilirubin: 0.5 mg/dL (ref 0.2–1.2)
Total Protein: 6.5 g/dL (ref 6.0–8.3)

## 2023-05-11 LAB — CBC WITH DIFFERENTIAL/PLATELET
Basophils Absolute: 0 10*3/uL (ref 0.0–0.1)
Basophils Relative: 0.7 % (ref 0.0–3.0)
Eosinophils Absolute: 0.1 10*3/uL (ref 0.0–0.7)
Eosinophils Relative: 1.5 % (ref 0.0–5.0)
HCT: 39.9 % (ref 36.0–46.0)
Hemoglobin: 13.7 g/dL (ref 12.0–15.0)
Lymphocytes Relative: 33 % (ref 12.0–46.0)
Lymphs Abs: 1.4 10*3/uL (ref 0.7–4.0)
MCHC: 34.3 g/dL (ref 30.0–36.0)
MCV: 96.4 fl (ref 78.0–100.0)
Monocytes Absolute: 0.4 10*3/uL (ref 0.1–1.0)
Monocytes Relative: 9.1 % (ref 3.0–12.0)
Neutro Abs: 2.4 10*3/uL (ref 1.4–7.7)
Neutrophils Relative %: 55.7 % (ref 43.0–77.0)
Platelets: 144 10*3/uL — ABNORMAL LOW (ref 150.0–400.0)
RBC: 4.14 Mil/uL (ref 3.87–5.11)
RDW: 13.1 % (ref 11.5–15.5)
WBC: 4.2 10*3/uL (ref 4.0–10.5)

## 2023-05-11 LAB — LIPID PANEL
Cholesterol: 113 mg/dL (ref 0–200)
HDL: 33.2 mg/dL — ABNORMAL LOW (ref >39.00)
LDL Cholesterol: 30 mg/dL (ref 0–99)
NonHDL: 79.4
Total CHOL/HDL Ratio: 3
Triglycerides: 248 mg/dL — ABNORMAL HIGH (ref 0.0–149.0)
VLDL: 49.6 mg/dL — ABNORMAL HIGH (ref 0.0–40.0)

## 2023-05-11 LAB — VITAMIN D 25 HYDROXY (VIT D DEFICIENCY, FRACTURES): VITD: 50.99 ng/mL (ref 30.00–100.00)

## 2023-05-11 MED ORDER — DENOSUMAB 60 MG/ML ~~LOC~~ SOSY: 60.0000 mg | PREFILLED_SYRINGE | Freq: Once | SUBCUTANEOUS | Status: DC

## 2023-05-11 MED ORDER — CLONAZEPAM 1 MG PO TABS: 0.5000 mg | ORAL_TABLET | Freq: Every evening | ORAL | 5 refills | Status: DC | PRN

## 2023-05-11 NOTE — Patient Instructions (Addendum)
 Please return in 6 months for recheck  I will release your lab results to you on your MyChart account with further instructions. You may see the results before I do, but when I review them I will send you a message with my report or have my assistant call you if things need to be discussed. Please reply to my message with any questions. Thank you!   We will call you to get your prolia injections restarted.   If you have any questions or concerns, please don't hesitate to send me a message via MyChart or call the office at [phone]. Thank you for visiting with [place] today! It's our pleasure caring for you.

## 2023-05-11 NOTE — Telephone Encounter (Signed)
 Prolia VOB initiated via [email or web address]

## 2023-05-11 NOTE — Telephone Encounter (Signed)
 Please start Prolia PA for this pt.  Thank You.

## 2023-05-11 NOTE — Progress Notes (Signed)
 Subjective  Chief Complaint  Patient presents with   Follow-up    Pt here for 6 month follow-up   Depression   Anxiety   Gastroesophageal Reflux    HPI: Taylor Hopkins is a 73 y.o. female who presents to [facility] today for a Female Wellness Visit. She also has the concerns and/or needs as listed above in the chief complaint. These will be addressed in addition to the Health Maintenance Visit.   Wellness Visit: annual visit with health maintenance review and exam  HM: screens are current. Declines flu vaccine today.  Chronic disease f/u and/or acute problem visit: (deemed necessary to be done in addition to the wellness visit) Discussed the use of AI scribe software for clinical note transcription with the patient, who gave verbal consent to proceed.  History of Present Illness   Taylor Hopkins is a 73 year old female who presents for chronic problem f/u:  Resolved sinusitis. See last note. Some allergy sxs persist.  Follow-up depression on Wellbutrin and Klonopin.  Mood is improved since last visit.  Still overwhelmed, has been has multiple medical issues s/p surgery.  She discusses her mood and medication management, noting she is out of Klonopin, which she uses nightly for sleep. It helps her sleep, although she sometimes struggles to fall asleep if she uses her phone before bed. She is also taking Wellbutrin for depression and reports feeling 'a little brighter.'  Osteoporosis: She has a history of osteoporosis and previously received Prolia injections, which she stopped due to cost. She recalls a past fracture that required a plate and screws, which occurred when she fell while attempting to go to the gym. She is uncertain about whether to resume Prolia treatment.  No side effects.  Also vitamin deficient.  Due for recheck.  Takes over-the-counter medications.  Overactive bladder: She mentions bladder issues, for which she was prescribed oxybutynin  but has not yet tried it. She manages her symptoms by wearing a pad but finds it 'a little annoying.' She is considering starting the medication and inquires about potential side effects, such as dry mouth.  History of neuropathy: Unclear workup in the past.  She reports occasional neuropathy in her feet but is not currently taking gabapentin. She is unsure of the cause of her neuropathy and has not seen a neurologist for it.  Hyperlipidemia fasting for recheck on Lipitor: She is taking atorvastatin for cholesterol management, and her cholesterol levels were checked in October and found to be 'perfect.' She is also aware of her low vitamin D levels and is taking a prescription dose to address this.   GERD is controlled on Dexilant: No reflux: Melena.  History of prediabetes without symptoms of hyperglycemia.      Assessment  1. Encounter for well adult exam with abnormal findings   2. Prediabetes   3. Major depression, recurrent, chronic (HCC)   4. Age related osteoporosis, unspecified pathological fracture presence   5. Peripheral polyneuropathy   6. Vitamin D deficiency   7. Mixed hyperlipidemia   8. Gastroesophageal reflux disease with esophagitis without hemorrhage      Plan  Female Wellness Visit: Age appropriate Health Maintenance and Prevention measures were discussed with patient. Included topics are cancer screening recommendations, ways to keep healthy (see AVS) including dietary and exercise recommendations, regular eye and dental care, use of seat belts, and avoidance of moderate alcohol use and tobacco use.  BMI: discussed patient's BMI and encouraged positive  lifestyle modifications to help get to or maintain a target BMI. HM needs and immunizations were addressed and ordered. See below for orders. See HM and immunization section for updates. Routine labs and screening tests ordered including cmp, cbc and lipids where appropriate. Discussed recommendations regarding Vit D  and calcium supplementation (see AVS)  Chronic disease management visit and/or acute problem visit: Assessment and Plan    Allergic Rhinitis/sinusitis Persistent rhinorrhea and congestion likely due to allergies. Symptoms improved post-antibiotics, indicating a resolved concurrent infection. - Consider allergy management strategies if symptoms persist.  Chronic Insomnia Currently uses Klonopin nightly for sleep. Trazodone recommended as a safer, more effective alternative for sleep, with Klonopin reserved for anxiety. - Refill Klonopin for anxiety management. - Recommend trazodone 50-150 mg nightly for sleep.  Osteoporosis Osteoporosis previously managed with Prolia, discontinued due to cost. Consistent treatment crucial to prevent bone breakdown and fractures. - Evaluate Prolia insurance coverage and restart treatment  -Recheck vitamin D levels -Recommend weightbearing exercises  Peripheral Neuropathy Occasional neuropathy symptoms in feet, not currently on gabapentin. Etiology unclear, no neurology consultation yet. No treatment currently.  Gabapentin in the future backs up again.  Urinary Incontinence Experiences urinary incontinence, has not initiated oxybutynin. Inquired about side effects, including dry mouth. - Keep oxybutynin on medication list for potential future use.  Hyperlipidemia On atorvastatin with previously normal cholesterol levels. - Recheck cholesterol levels with current blood work.  Vitamin D Deficiency Continued vitamin D deficiency noted in previous blood work. - Prescribe high-dose vitamin D supplement.  General Health Maintenance Routine blood work planned to monitor overall health. - Order fasting blood work.       Follow up: 6 months for recheck Orders Placed This Encounter  Procedures   Comprehensive metabolic panel   Lipid panel   VITAMIN D 25 Hydroxy (Vit-D Deficiency, Fractures)   Hemoglobin A1c   CBC with Differential/Platelet    Meds ordered this encounter  Medications   clonazePAM (KLONOPIN) 1 MG tablet    Sig: Take 0.5-1 tablets (0.5-1 mg total) by mouth at bedtime as needed for anxiety.    Dispense:  30 tablet    Refill:  5    Not to exceed 3 additional fills before 02/07/2023      Body mass index is 29.96 kg/m. Wt Readings from Last 3 Encounters:  05/11/23 185 lb 9.6 oz (84.2 kg)  05/01/23 180 lb (81.6 kg)  04/29/23 180 lb (81.6 kg)     Patient Active Problem List   Diagnosis Date Noted Date Diagnosed   Adenomatous polyp of colon 07/04/2021     Priority: High    Colonoscopy  08/2022    Lumbar radiculopathy, chronic 07/26/2019     Priority: High   Prediabetes 07/26/2019     Priority: High   Insomnia 07/04/2018     Priority: High    Failed trazadone.  Trial of klonopin 06/2022    Osteoporosis 07/04/2018     Priority: High    ? H/o osteoporosis, failed biphosphonates due to gastritis. Don't have record. Last dexa 2019 with osteopenia T = -2.0 lowest.  DEXA June 2020: osteopenia T=-2.1 lowest. Reports h/o prior fracture. Had been on prolia but due to unclear history, we stopped prolia; recheck in 2 years DEXA 2022: lowest T = -2.4 femur; so restart prolia; first injection 05/2021 DEXA 12/2022: lowest T = -2.3 femur, increase in lumbar spine; continue Prolia. Recheck 2-3 years.     Major depression, recurrent, chronic (HCC) 07/04/2018     Priority:  High     failed zoloft, celexa, trazadone, and effexor and lexapro Failed prozac due to side effects: nausea Doing well on wellbutrin      OSA (obstructive sleep apnea), not on CPAP 07/04/2018     Priority: High   Urinary, incontinence, stress female 03/28/2020     Priority: Medium    Urinary incontinence, mixed 03/28/2020     Priority: Medium    Gallstones 07/26/2019     Priority: Medium     asymtomatic    S/P dilatation of esophageal stricture 07/23/2018     Priority: Medium    Colon polyp 07/04/2018     Priority: Medium    IBS  (irritable bowel syndrome) 07/04/2018     Priority: Medium    Chronic neck pain due to DJD, DDD, unable to tolerate NSAIDs 07/04/2018     Priority: Medium    GERD (gastroesophageal reflux disease) 10/02/2008     Priority: Medium    Seasonal allergic rhinitis due to pollen 07/26/2019     Priority: Low   Vitamin D deficiency 07/04/2018     Priority: Low   Peripheral neuropathy 05/11/2023    Mixed hyperlipidemia 05/11/2023    UGI bleed 10/09/2022     EGD 08/2022: mild erosions; no stigmata of bleeding. No high risk of bleeding. Monitor, H. Pylori negative.continue dexilant    Hiatal hernia 10/09/2022    Notalgia paresthetica 08/11/2022    Health Maintenance  Topic Date Due   COVID-19 Vaccine (4 - 2024-25 season) 05/17/2023 (Originally 10/26/2022)   INFLUENZA VACCINE  05/25/2023 (Originally 09/25/2022)   MAMMOGRAM  11/18/2023   Medicare Annual Wellness (AWV)  04/28/2024   DEXA SCAN  01/01/2025   Colonoscopy  08/18/2025   Pneumonia Vaccine 81+ Years old  Completed   Hepatitis C Screening  Completed   Zoster Vaccines- Shingrix  Completed   HPV VACCINES  Aged Out   DTaP/Tdap/Td  Discontinued   Immunization History  Administered Date(s) Administered   Fluad Quad(high Dose 65+) 11/19/2018   PFIZER(Purple Top)SARS-COV-2 Vaccination 06/03/2019, 06/24/2019, 03/24/2020   Pneumococcal Conjugate-13 03/28/2020   Pneumococcal Polysaccharide-23 11/19/2018   Zoster Recombinant(Shingrix) 08/22/2022, 11/30/2022   We updated and reviewed the patient's past history in detail and it is documented below. Allergies: Patient is allergic to fluoxetine, prozac [fluoxetine hcl], sulfonamide derivatives, adhesive  [tape], other, sulfa antibiotics, and wound dressing adhesive. Past Medical History Patient  has a past medical history of Adenomatous colon polyp (06/2003), Allergic rhinitis, Anxiety, Arthritis, Cataract (2010), COVID-19, Depression, Gallstones (07/26/2019), GERD (gastroesophageal reflux  disease), and Sleep apnea. Past Surgical History Patient  has a past surgical history that includes Tonsillectomy; Lumbar disc surgery; Total abdominal hysterectomy; Ankle surgery (Right); Breast enhancement surgery; Colonoscopy; and Upper gastrointestinal endoscopy. Family History: Patient family history includes Allergies in her brother; Colon polyps in her father; Diabetes in her mother; Diverticulitis in her father; Esophageal cancer in her maternal grandmother; Heart attack in her father; Heart failure in her father and mother; Kidney failure in her mother; Mitral valve prolapse in her mother; Stomach cancer in her paternal uncle. Social History:  Patient  reports that she has never smoked. She has never used smokeless tobacco. She reports current alcohol use. She reports that she does not use drugs.  Review of Systems: Constitutional: negative for fever or malaise Ophthalmic: negative for photophobia, double vision or loss of vision Cardiovascular: negative for chest pain, dyspnea on exertion, or new LE swelling Respiratory: negative for SOB or persistent cough Gastrointestinal: negative for abdominal pain,  change in bowel habits or melena Genitourinary: negative for dysuria or gross hematuria, no abnormal uterine bleeding or disharge Musculoskeletal: negative for new gait disturbance or muscular weakness Integumentary: negative for new or persistent rashes, no breast lumps Neurological: negative for TIA or stroke symptoms Psychiatric: negative for SI or delusions Allergic/Immunologic: negative for hives  Patient Care Team    Relationship Specialty Notifications Start End  Willow Ora, MD PCP - General Family Medicine  01/26/19   Ginette Otto, [facility] Consulting Physician Gynecology  07/02/18    Comment: Tera Helper, MD Consulting Physician Sports Medicine  07/02/18   Meryl Dare, MD (Inactive) Consulting Physician Gastroenterology  07/02/18   Tyrell Antonio, MD Consulting Physician Physical Medicine and Rehabilitation  11/19/18     Objective  Vitals: BP 124/77   Pulse 74   Temp (!) 97.2 F (36.2 C)   Ht 5\' 6"  (1.676 m)   Wt 185 lb 9.6 oz (84.2 kg)   SpO2 99%   BMI 29.96 kg/m  General:  Well developed, well nourished, no acute distress  Psych:  Alert and orientedx3,normal mood and affect HEENT:  Normocephalic, atraumatic, non-icteric sclera,  supple neck without adenopathy, mass or thyromegaly Cardiovascular:  Normal S1, S2, RRR without gallop, rub or murmur Respiratory:  Good breath sounds bilaterally, CTAB with normal respiratory effort Gastrointestinal: normal bowel sounds, soft, non-tender, no noted masses. No HSM MSK: extremities without edema, joints without erythema or swelling Neurologic:    Mental status is normal.  Gross motor and sensory exams are normal.  No tremor  Commons side effects, risks, benefits, and alternatives for medications and treatment plan prescribed today were discussed, and the patient expressed understanding of the given instructions. Patient is instructed to call or message via MyChart if he/she has any questions or concerns regarding our treatment plan. No barriers to understanding were identified. We discussed Red Flag symptoms and signs in detail. Patient expressed understanding regarding what to do in case of urgent or emergency type symptoms.  Medication list was reconciled, printed and provided to the patient in AVS. Patient instructions and summary information was reviewed with the patient as documented in the AVS. This note was prepared with assistance of Dragon voice recognition software. Occasional wrong-word or sound-a-like substitutions may have occurred due to the inherent limitations of voice recognition software

## 2023-05-11 NOTE — Addendum Note (Signed)
 Addended by: Samara Deist on: 05/11/2023 08:45 AM   Modules accepted: Orders

## 2023-05-19 ENCOUNTER — Other Ambulatory Visit: Payer: Self-pay | Admitting: Family Medicine

## 2023-05-20 ENCOUNTER — Encounter: Payer: Self-pay | Admitting: Family Medicine

## 2023-05-20 NOTE — Progress Notes (Signed)
 See mychart note Dear Taylor Hopkins, I have reviewed your lab results. Most all is stable. Your sugar and triglycerides are slightly above normal; were you fasting?  If so, watch your sugar intake and consider adding  OTC fish oil capsules twice daily or more fish to your diet.  No other changes are needed at this time. Take care. Sincerely, Dr. Mardelle Matte

## 2023-05-22 ENCOUNTER — Other Ambulatory Visit (HOSPITAL_COMMUNITY): Payer: Self-pay

## 2023-05-29 ENCOUNTER — Other Ambulatory Visit: Payer: Self-pay | Admitting: Family Medicine

## 2023-06-01 ENCOUNTER — Telehealth: Payer: Self-pay | Admitting: Family Medicine

## 2023-06-01 NOTE — Telephone Encounter (Unsigned)
 Copied from CRM [id number]. Topic: Referral - Question >> Jun 01, 2023 12:58 PM Shelah Lewandowsky wrote: Reason for CRM: received email about referral, wants to stay with [facility] please call [phone]

## 2023-06-03 ENCOUNTER — Ambulatory Visit: Payer: Medicare Other | Admitting: Family Medicine

## 2023-06-05 ENCOUNTER — Other Ambulatory Visit (HOSPITAL_COMMUNITY): Payer: Self-pay

## 2023-06-11 ENCOUNTER — Other Ambulatory Visit (HOSPITAL_COMMUNITY): Payer: Self-pay

## 2023-06-11 NOTE — Telephone Encounter (Signed)
 Prolia VOB resubmitted via [email or web address]

## 2023-06-14 ENCOUNTER — Other Ambulatory Visit: Payer: Self-pay

## 2023-06-14 ENCOUNTER — Emergency Department (HOSPITAL_COMMUNITY)
Admission: EM | Admit: 2023-06-14 | Discharge: 2023-06-14 | Disposition: A | Discharge status: Home / Self Care | Attending: Emergency Medicine | Admitting: Emergency Medicine

## 2023-06-14 DIAGNOSIS — R21 Rash and other nonspecific skin eruption: Secondary | ICD-10-CM | POA: Diagnosis present

## 2023-06-14 MED ORDER — DIPHENHYDRAMINE HCL 25 MG PO CAPS
25.0000 mg | ORAL_CAPSULE | Freq: Once | ORAL | Status: AC
  Administered 2023-06-14: 25 mg via ORAL
  Filled 2023-06-14: qty 1

## 2023-06-14 MED ORDER — DEXAMETHASONE SODIUM PHOSPHATE 10 MG/ML IJ SOLN
10.0000 mg | Freq: Once | INTRAMUSCULAR | Status: AC
  Administered 2023-06-14: 10 mg via INTRAMUSCULAR
  Filled 2023-06-14: qty 1

## 2023-06-14 NOTE — ED Triage Notes (Addendum)
 Pt. Stated, Taylor Hopkins had a rash with itching and burning. The redness is more on the right side of her face close to her eye

## 2023-06-14 NOTE — Discharge Instructions (Signed)
 It was a pleasure taking part in your care.  As we discussed, you will need to follow-up with dermatology on 4/29.  We provided you with a Decadron  shot, this is a steroid and should last you about 6 days. Please take Benadryl  every 6 hours as needed for itching.  You may also take famotidine as this is an H2 blocker.  Please return to the ED with any new or worsening symptoms.

## 2023-06-14 NOTE — ED Provider Notes (Signed)
 [place] EMERGENCY DEPARTMENT AT [facility] Provider Note   CSN: [id number] Arrival date & time: 06/14/23  4098     History No chief complaint on file.   Taylor Hopkins is a 73 y.o. female with medical history significant for GERD, depression, COVID-19, arthritis, anxiety.  Patient presents to ED for evaluation of rash to face.  States that for the last 4 to 5 days she has had a rash that began above her lips and has spread around her eyes.  She reports that this rash itches, burns and stings.  She has tried putting some "skin cancer" cream on it but this has not relieved it.  She denies a history of psoriasis or eczema.  She denies any visual deficits, ear pain, fevers.  Denies any new lotions, detergents or soaps.  Reports that she made a appointment with dermatology but is unable to see them until the end of the month.  HPI     Home Medications Prior to Admission medications   Medication Sig Start Date End Date Taking? Authorizing Provider  atorvastatin  (LIPITOR) 10 MG tablet TAKE 1 TABLET BY MOUTH EVERYDAY AT BEDTIME 08/25/22   Luevenia Saha, MD  BIOTIN PO Take 3,000 mcg by mouth.    [provider]  buPROPion  (WELLBUTRIN  SR) 150 MG 12 hr tablet Take 1 tablet (150 mg total) by mouth 2 (two) times daily. 10/09/22   Luevenia Saha, MD  busPIRone  (BUSPAR ) 7.5 MG tablet Take 7.5 mg by mouth 3 (three) times daily as needed. 04/04/23   [provider]  clobetasol  (TEMOVATE ) 0.05 % external solution Apply 1 Application topically 2 (two) times daily. 08/18/22   Luevenia Saha, MD  clonazePAM  (KLONOPIN ) 1 MG tablet Take 0.5-1 tablets (0.5-1 mg total) by mouth at bedtime as needed for anxiety. 05/11/23   Luevenia Saha, MD  dexlansoprazole  (DEXILANT ) 60 MG capsule Take 1 capsule (60 mg total) by mouth daily. 06/12/22   Luevenia Saha, MD  oxybutynin  (DITROPAN  XL) 10 MG 24 hr tablet Take 10 mg by mouth at bedtime. Patient not taking: Reported on 05/11/2023 08/11/22    Luevenia Saha, MD  PROLIA  60 MG/ML SOSY injection Inject 60 mg into the skin every 6 (six) months. 08/12/22   [provider]  traZODone  (DESYREL ) 50 MG tablet TAKE 0.5-1 TABLETS (25-50 MG TOTAL) BY MOUTH AT BEDTIME AS NEEDED. FOR SLEEP 10/22/22   Luevenia Saha, MD  valACYclovir  (VALTREX ) 1000 MG tablet TAKE 2 TABLETS BY MOUTH 2 (TWO) TIMES DAILY. ONCE, AS NEEDED FOR COLD SORES 01/27/23   Luevenia Saha, MD  Vitamin D , Ergocalciferol , (DRISDOL ) 1.25 MG (50000 UNIT) CAPS capsule TAKE 1 CAPSULE BY MOUTH ONE TIME PER WEEK 05/19/23   Luevenia Saha, MD      Allergies    Fluoxetine , Prozac  [fluoxetine  hcl], Sulfonamide derivatives, Adhesive  [tape], Other, Sulfa antibiotics, and Wound dressing adhesive    Review of Systems   Review of Systems  HENT:  Negative for ear pain.   Eyes:  Negative for visual disturbance.  Skin:  Positive for rash.  All other systems reviewed and are negative.   Physical Exam Updated Vital Signs BP 130/71   Pulse 91   Temp 97.6 F (36.4 C)   Resp 16   Ht 5' 6.75" (1.695 m)   Wt 81.6 kg   SpO2 99%   BMI 28.40 kg/m  Physical Exam Vitals and nursing note reviewed.  Constitutional:  General: She is not in acute distress.    Appearance: She is well-developed.  HENT:     Head: Normocephalic and atraumatic.  Eyes:     Conjunctiva/sclera: Conjunctivae normal.  Cardiovascular:     Rate and Rhythm: Normal rate and regular rhythm.     Heart sounds: No murmur heard. Pulmonary:     Effort: Pulmonary effort is normal. No respiratory distress.     Breath sounds: Normal breath sounds.  Abdominal:     Palpations: Abdomen is soft.     Tenderness: There is no abdominal tenderness.  Musculoskeletal:        General: No swelling.     Cervical back: Neck supple.  Skin:    General: Skin is warm and dry.     Capillary Refill: Capillary refill takes less than 2 seconds.     Findings: Rash present.     Comments: Periorbital rash, blanches.  Patient  also has rash inferiorly to bilateral nares.  Neurological:     Mental Status: She is alert.  Psychiatric:        Mood and Affect: Mood normal.       ED Results / Procedures / Treatments   Labs (all labs ordered are listed, but only abnormal results are displayed) Labs Reviewed - No data to display  EKG None  Radiology No results found.  Procedures Procedures    Medications Ordered in ED Medications  diphenhydrAMINE  (BENADRYL ) capsule 25 mg (has no administration in time range)  dexamethasone  (DECADRON ) injection 10 mg (has no administration in time range)    ED Course/ Medical Decision Making/ A&P  Medical Decision Making Risk Prescription drug management.   73 year old female presents for evaluation.  Please see HPI for further details.  On examination patient is afebrile and nontachycardic.  Lung sounds are clear bilaterally, not hypoxic.  Abdomen soft and compressible.  Neurological examinations at baseline.  Patient does have a periorbital rash.  Patient also has rash inferiorly to bilateral nares.  This rash blanches.  This rash crosses the dermatome, is not one-sided.  I have low concern for SJS, TENS, shingles.  Patient was provided with Decadron , Benadryl .  Patient reports she has follow-up with dermatology on 4/29, in 9 days.  Advised the patient to follow-up with dermatology.  Advised her to take Benadryl  and famotidine in the meantime for any kind of itching she may experience.  She was given return precautions and she voiced understanding.  Stable to discharge home.   Final Clinical Impression(s) / ED Diagnoses Final diagnoses:  Rash    Rx / DC Orders ED Discharge Orders     None         Kristin Peyer 06/14/23 1114    Long, Shereen Dike, MD 06/23/23 1728

## 2023-06-15 ENCOUNTER — Other Ambulatory Visit (HOSPITAL_COMMUNITY): Payer: Self-pay

## 2023-06-25 ENCOUNTER — Other Ambulatory Visit (HOSPITAL_COMMUNITY): Payer: Self-pay

## 2023-07-01 ENCOUNTER — Encounter (HOSPITAL_BASED_OUTPATIENT_CLINIC_OR_DEPARTMENT_OTHER): Payer: Self-pay

## 2023-07-01 ENCOUNTER — Emergency Department (HOSPITAL_BASED_OUTPATIENT_CLINIC_OR_DEPARTMENT_OTHER)
Admission: EM | Admit: 2023-07-01 | Discharge: 2023-07-02 | Disposition: A | Discharge status: Home / Self Care | Attending: Emergency Medicine | Admitting: Emergency Medicine

## 2023-07-01 ENCOUNTER — Other Ambulatory Visit: Payer: Self-pay

## 2023-07-01 ENCOUNTER — Emergency Department (HOSPITAL_BASED_OUTPATIENT_CLINIC_OR_DEPARTMENT_OTHER)

## 2023-07-01 DIAGNOSIS — E876 Hypokalemia: Secondary | ICD-10-CM | POA: Diagnosis not present

## 2023-07-01 DIAGNOSIS — R109 Unspecified abdominal pain: Secondary | ICD-10-CM | POA: Insufficient documentation

## 2023-07-01 DIAGNOSIS — L0291 Cutaneous abscess, unspecified: Secondary | ICD-10-CM

## 2023-07-01 DIAGNOSIS — L03317 Cellulitis of buttock: Secondary | ICD-10-CM | POA: Insufficient documentation

## 2023-07-01 DIAGNOSIS — L0231 Cutaneous abscess of buttock: Secondary | ICD-10-CM | POA: Insufficient documentation

## 2023-07-01 DIAGNOSIS — R112 Nausea with vomiting, unspecified: Secondary | ICD-10-CM | POA: Diagnosis not present

## 2023-07-01 LAB — CBC WITH DIFFERENTIAL/PLATELET
Abs Immature Granulocytes: 0.03 10*3/uL (ref 0.00–0.07)
Basophils Absolute: 0 10*3/uL (ref 0.0–0.1)
Basophils Relative: 0 %
Eosinophils Absolute: 0.1 10*3/uL (ref 0.0–0.5)
Eosinophils Relative: 1 %
HCT: 33.9 % — ABNORMAL LOW (ref 36.0–46.0)
Hemoglobin: 11.8 g/dL — ABNORMAL LOW (ref 12.0–15.0)
Immature Granulocytes: 0 %
Lymphocytes Relative: 20 %
Lymphs Abs: 1.7 10*3/uL (ref 0.7–4.0)
MCH: 32.7 pg (ref 26.0–34.0)
MCHC: 34.8 g/dL (ref 30.0–36.0)
MCV: 93.9 fL (ref 80.0–100.0)
Monocytes Absolute: 0.9 10*3/uL (ref 0.1–1.0)
Monocytes Relative: 10 %
Neutro Abs: 5.7 10*3/uL (ref 1.7–7.7)
Neutrophils Relative %: 69 %
Platelets: 133 10*3/uL — ABNORMAL LOW (ref 150–400)
RBC: 3.61 MIL/uL — ABNORMAL LOW (ref 3.87–5.11)
RDW: 12.6 % (ref 11.5–15.5)
WBC: 8.3 10*3/uL (ref 4.0–10.5)
nRBC: 0 % (ref 0.0–0.2)

## 2023-07-01 LAB — COMPREHENSIVE METABOLIC PANEL WITH GFR
ALT: 14 U/L (ref 0–44)
AST: 24 U/L (ref 15–41)
Albumin: 3.9 g/dL (ref 3.5–5.0)
Alkaline Phosphatase: 42 U/L (ref 38–126)
Anion gap: 12 (ref 5–15)
BUN: 17 mg/dL (ref 8–23)
CO2: 25 mmol/L (ref 22–32)
Calcium: 9.2 mg/dL (ref 8.9–10.3)
Chloride: 101 mmol/L (ref 98–111)
Creatinine, Ser: 0.81 mg/dL (ref 0.44–1.00)
GFR, Estimated: >60 mL/min (ref >60)
Glucose, Bld: 101 mg/dL — ABNORMAL HIGH (ref 70–99)
Potassium: 3.2 mmol/L — ABNORMAL LOW (ref 3.5–5.1)
Sodium: 139 mmol/L (ref 135–145)
Total Bilirubin: 0.5 mg/dL (ref 0.0–1.2)
Total Protein: 6.4 g/dL — ABNORMAL LOW (ref 6.5–8.1)

## 2023-07-01 LAB — LACTIC ACID, PLASMA: Lactic Acid, Venous: 1 mmol/L (ref 0.5–1.9)

## 2023-07-01 MED ORDER — IOHEXOL 300 MG/ML  SOLN
100.0000 mL | Freq: Once | INTRAMUSCULAR | Status: AC | PRN
  Administered 2023-07-01: 100 mL via INTRAVENOUS

## 2023-07-01 MED ORDER — POTASSIUM CHLORIDE CRYS ER 20 MEQ PO TBCR
40.0000 meq | EXTENDED_RELEASE_TABLET | Freq: Once | ORAL | Status: AC
  Administered 2023-07-01: 40 meq via ORAL
  Filled 2023-07-01: qty 2

## 2023-07-01 MED ORDER — OXYCODONE-ACETAMINOPHEN 5-325 MG PO TABS
1.0000 | ORAL_TABLET | Freq: Once | ORAL | Status: AC
  Administered 2023-07-01: 1 via ORAL
  Filled 2023-07-01: qty 1

## 2023-07-01 MED ORDER — LIDOCAINE-EPINEPHRINE (PF) 2 %-1:200000 IJ SOLN: 10.0000 mL | Freq: Once | INTRAMUSCULAR | Status: DC

## 2023-07-01 MED ORDER — DOXYCYCLINE HYCLATE 100 MG PO CAPS: 100.0000 mg | ORAL_CAPSULE | Freq: Two times a day (BID) | ORAL | 0 refills | Status: AC

## 2023-07-01 NOTE — ED Provider Notes (Signed)
 [place] EMERGENCY DEPARTMENT AT [facility] Provider Note   CSN: [id number] Arrival date & time: 07/01/23  1856     History  Chief Complaint  Patient presents with   Abscess    ARCELIA ESSINGTON is a 73 y.o. female history of GERD, IBS, prediabetes presented with abscess to her right buttock that has been there for 2 days.  Patient denies fevers but states she feels unwell.  Patient has nausea vomiting abdominal pain.  Patient states she has not had this before.  Patient states abscess is already draining.  Home Medications Prior to Admission medications   Medication Sig Start Date End Date Taking? Authorizing Provider  doxycycline  (VIBRAMYCIN ) 100 MG capsule Take 1 capsule (100 mg total) by mouth 2 (two) times daily for 7 days. 07/01/23 07/08/23 Yes Josee Speece, Arlin Benes, PA-C  atorvastatin  (LIPITOR) 10 MG tablet TAKE 1 TABLET BY MOUTH EVERYDAY AT BEDTIME 08/25/22   Luevenia Saha, MD  BIOTIN PO Take 3,000 mcg by mouth.    [provider]  buPROPion  (WELLBUTRIN  SR) 150 MG 12 hr tablet Take 1 tablet (150 mg total) by mouth 2 (two) times daily. 10/09/22   Luevenia Saha, MD  busPIRone  (BUSPAR ) 7.5 MG tablet Take 7.5 mg by mouth 3 (three) times daily as needed. 04/04/23   [provider]  clobetasol  (TEMOVATE ) 0.05 % external solution Apply 1 Application topically 2 (two) times daily. 08/18/22   Luevenia Saha, MD  clonazePAM  (KLONOPIN ) 1 MG tablet Take 0.5-1 tablets (0.5-1 mg total) by mouth at bedtime as needed for anxiety. 05/11/23   Luevenia Saha, MD  dexlansoprazole  (DEXILANT ) 60 MG capsule Take 1 capsule (60 mg total) by mouth daily. 06/12/22   Luevenia Saha, MD  oxybutynin  (DITROPAN  XL) 10 MG 24 hr tablet Take 10 mg by mouth at bedtime. Patient not taking: Reported on 05/11/2023 08/11/22   Luevenia Saha, MD  PROLIA  60 MG/ML SOSY injection Inject 60 mg into the skin every 6 (six) months. 08/12/22   [provider]  traZODone  (DESYREL ) 50 MG tablet TAKE  0.5-1 TABLETS (25-50 MG TOTAL) BY MOUTH AT BEDTIME AS NEEDED. FOR SLEEP 10/22/22   Luevenia Saha, MD  valACYclovir  (VALTREX ) 1000 MG tablet TAKE 2 TABLETS BY MOUTH 2 (TWO) TIMES DAILY. ONCE, AS NEEDED FOR COLD SORES 01/27/23   Luevenia Saha, MD  Vitamin D , Ergocalciferol , (DRISDOL ) 1.25 MG (50000 UNIT) CAPS capsule TAKE 1 CAPSULE BY MOUTH ONE TIME PER WEEK 05/19/23   Luevenia Saha, MD      Allergies    Fluoxetine , Prozac  [fluoxetine  hcl], Sulfonamide derivatives, Adhesive  [tape], Other, Sulfa antibiotics, and Wound dressing adhesive    Review of Systems   Review of Systems  Physical Exam Updated Vital Signs BP 128/65 (BP Location: Right Arm)   Pulse 86   Temp 98.5 F (36.9 C)   Resp 18   Ht 5\' 6"  (1.676 m)   Wt 83 kg   SpO2 96%   BMI 29.54 kg/m  Physical Exam Constitutional:      General: She is not in acute distress. Cardiovascular:     Rate and Rhythm: Normal rate and regular rhythm.     Pulses: Normal pulses.     Heart sounds: Normal heart sounds.  Pulmonary:     Effort: Pulmonary effort is normal. No respiratory distress.     Breath sounds: Normal breath sounds.  Abdominal:     Palpations: Abdomen is soft.  Tenderness: There is no abdominal tenderness. There is no guarding or rebound.  Genitourinary:    Comments: Chaperone: Connor, RN Large abscess on the left buttocks near the rectum that extends down to the perineum that is actively draining Does not appear necrotizing to me Skin:    General: Skin is warm and dry.  Neurological:     Mental Status: She is alert.     ED Results / Procedures / Treatments   Labs (all labs ordered are listed, but only abnormal results are displayed) Labs Reviewed  COMPREHENSIVE METABOLIC PANEL WITH GFR - Abnormal; Notable for the following components:      Result Value   Potassium 3.2 (*)    Glucose, Bld 101 (*)    Total Protein 6.4 (*)    All other components within normal limits  CBC WITH DIFFERENTIAL/PLATELET -  Abnormal; Notable for the following components:   RBC 3.61 (*)    Hemoglobin 11.8 (*)    HCT 33.9 (*)    Platelets 133 (*)    All other components within normal limits  LACTIC ACID, PLASMA    EKG None  Radiology No results found.  Procedures Procedures    Medications Ordered in ED Medications  oxyCODONE-acetaminophen  (PERCOCET/ROXICET) 5-325 MG per tablet 1 tablet (1 tablet Oral Given 07/01/23 2242)  potassium chloride SA (KLOR-CON M) CR tablet 40 mEq (40 mEq Oral Given 07/01/23 2330)  iohexol (OMNIPAQUE) 300 MG/ML solution 100 mL (100 mLs Intravenous Contrast Given 07/01/23 2341)    ED Course/ Medical Decision Making/ A&P                                 Medical Decision Making Amount and/or Complexity of Data Reviewed Labs: ordered. Radiology: ordered.  Risk Prescription drug management.   Shadon Barcena Doten 73 y.o. presented today for an abscess.  Working DDx that I considered at this time includes, but not limited to, abscess, cellulitis, erysipelas, HS, folliculitis, lymphangitis, necrotizing fasciitis/cellulitis, Fournier's, DVT.  R/o DDx: pending  Review of prior external notes: 06/23/2023 unknown  Unique Tests and My Independent Interpretation:  CBC: Unremarkable CMP: Mild hypokalemia 3.2 Lactic acid: Unremarkable CT abdomen pelvis with contrast:pending  Social Determinants of Health: none  Discussion with Independent Historian: None  Discussion of Management of Tests: None  Risk: Medium: Prescription medication use  Risk Stratification Score: None  Plan: On exam patient was in no acute distress with stable vitals.  With a chaperone in the room a rectal exam was conducted that shows a large abscess in the left butt cheek that is around the rectum extending onto the perineum.  Abscess is already draining.  Given location and perineum involvement we will get labs and imaging to rule out Fournier's.  Patient given pain meds.  Patient signed out to  Bolivar Bushman, MD.  Please review their note for the continuation of patient's care.  The plan at this point is follow up on labs and imaging and most likely discharge with general surgery follow up and antibiotics.  This chart was dictated using voice recognition software.  Despite best efforts to proofread,  errors can occur which can change the documentation meaning.        Final Clinical Impression(s) / ED Diagnoses Final diagnoses:  Hypokalemia  Abscess    Rx / DC Orders ED Discharge Orders          Ordered    doxycycline  (VIBRAMYCIN ) 100 MG capsule  2 times daily        07/01/23 2328              Elex Grimmer 07/01/23 2352    Scarlette Currier, MD 07/02/23 1401

## 2023-07-01 NOTE — Telephone Encounter (Signed)
 [employer] cannot pull up patient's medical coverage. Please advise if patient has another insurance.

## 2023-07-01 NOTE — Discharge Instructions (Addendum)
 Please follow-up with the general surgeon we have attached here for you today.  Today your labs and imaging show that you have a large abscess that his surgeon will need to see.  We have prescribed the antibiotics in the meantime.  If symptoms change or worsen please return to the ER.

## 2023-07-01 NOTE — ED Triage Notes (Signed)
 Pt states that she has an abscess on her buttock that has been there since Monday that has been draining, no fevers.

## 2023-07-02 ENCOUNTER — Other Ambulatory Visit (HOSPITAL_COMMUNITY): Payer: Self-pay

## 2023-07-02 MED ORDER — OXYCODONE-ACETAMINOPHEN 5-325 MG PO TABS: 1.0000 | ORAL_TABLET | Freq: Four times a day (QID) | ORAL | 0 refills | Status: DC | PRN

## 2023-07-02 MED ORDER — MORPHINE SULFATE (PF) 4 MG/ML IV SOLN
4.0000 mg | Freq: Once | INTRAVENOUS | Status: AC
  Administered 2023-07-02: 4 mg via INTRAVENOUS
  Filled 2023-07-02: qty 1

## 2023-07-02 NOTE — ED Provider Notes (Signed)
 Care assumed at shift change. Here for buttock cellulitis/abscess, pending CT to eval deep abscess.   Physical Exam  BP 128/65 (BP Location: Right Arm)   Pulse 86   Temp 98.5 F (36.9 C)   Resp 18   Ht 5\' 6"  (1.676 m)   Wt 83 kg   SpO2 96%   BMI 29.54 kg/m   Physical Exam Erythema, induration and small amount of purulent drainage to buttock Procedures  Procedures  ED Course / MDM   Clinical Course as of 07/02/23 0057  Thu Jul 02, 2023  0055 I personally viewed the images from radiology studies and agree with radiologist interpretation: CT shows cellulitis but no abscess or other complication. She is afebrile, normal WBC, no signs of systemic infection. Discussed trial of oral abx with close follow up, she will RTED sooner if symptoms worsen in the meantime.  [CS]    Clinical Course User Index [CS] Charmayne Cooper, MD   Medical Decision Making Problems Addressed: Cellulitis and abscess of buttock: acute illness or injury Hypokalemia: acute illness or injury  Amount and/or Complexity of Data Reviewed Radiology: independent interpretation performed. Decision-making details documented in ED Course.  Risk Prescription drug management. Parenteral controlled substances.          Charmayne Cooper, MD 07/02/23 [phone]

## 2023-07-02 NOTE — Telephone Encounter (Signed)
 Called [facility] to verify benefits.    $125 Deductible (Met), 15% coinsurance, PA on file

## 2023-07-02 NOTE — Telephone Encounter (Signed)
 Pt ready for scheduling for PROLIA  on or after : 07/02/23  Option# 1: Buy/Bill (Office supplied medication)  Out-of-pocket cost due at time of clinic visit: $247.50  Number of injection/visits approved: 2  Primary: [employer] MEDICARE Prolia  co-insurance: 15% Admin fee co-insurance: 0%  Secondary: --- Prolia  co-insurance:  Admin fee co-insurance:   Medical Benefit Details: Date Benefits were checked: 07/02/23 Deductible: $125 MET OF $125 REQUIRED/ Coinsurance: 15%/ Admin Fee: 0%  Prior Auth: APPROVED PA# [id number] Expiration Date: 08/11/22-08/11/23  # of doses approved: 2 ----------------------------------------------------------------------- Option# 2- Med Obtained from pharmacy:  Pharmacy benefit: Copay $798.26 (Paid to pharmacy) Admin Fee: 0% (Pay at clinic)  Prior Auth: N/A PA# Expiration Date:   # of doses approved:   If patient wants fill through the pharmacy benefit please send prescription to: [facility], and include estimated need by date in rx notes. Pharmacy will ship medication directly to the office.  Patient NOT eligible for Prolia  Copay Card. Copay Card can make patient's cost as little as $25. Link to apply: [email or web address]://[email or web address]  ** This summary of benefits is an estimation of the patient's out-of-pocket cost. Exact cost may very based on individual plan coverage.

## 2023-07-08 ENCOUNTER — Encounter: Payer: Self-pay | Admitting: Family Medicine

## 2023-07-08 ENCOUNTER — Telehealth: Payer: Self-pay | Admitting: Family Medicine

## 2023-07-08 ENCOUNTER — Ambulatory Visit: Admitting: Family Medicine

## 2023-07-08 VITALS — BP 133/72 | HR 89 | Temp 97.7°F | Ht 66.0 in | Wt 179.2 lb

## 2023-07-08 DIAGNOSIS — L0231 Cutaneous abscess of buttock: Secondary | ICD-10-CM

## 2023-07-08 DIAGNOSIS — F339 Major depressive disorder, recurrent, unspecified: Secondary | ICD-10-CM

## 2023-07-08 DIAGNOSIS — E876 Hypokalemia: Secondary | ICD-10-CM

## 2023-07-08 LAB — BASIC METABOLIC PANEL WITH GFR
BUN: 16 mg/dL (ref 6–23)
CO2: 28 meq/L (ref 19–32)
Calcium: 9.7 mg/dL (ref 8.4–10.5)
Chloride: 103 meq/L (ref 96–112)
Creatinine, Ser: 0.76 mg/dL (ref 0.40–1.20)
GFR: 77.55 mL/min (ref >60.00)
Glucose, Bld: 97 mg/dL (ref 70–99)
Potassium: 3.3 meq/L — ABNORMAL LOW (ref 3.5–5.1)
Sodium: 141 meq/L (ref 135–145)

## 2023-07-08 NOTE — Telephone Encounter (Signed)
 Pt states she forgot to tell provider on 07/08/23 she can't get rid of her cough & is requesting something for it.

## 2023-07-08 NOTE — Patient Instructions (Addendum)
 Please follow up as scheduled for your next visit with me: 11/11/2023   If you have any questions or concerns, please don't hesitate to send me a message via MyChart or call the office at [phone]. Thank you for visiting with us  today! It's our pleasure caring for you.    VISIT SUMMARY: During your visit, we discussed the boil on your buttock, your mental health, and your low potassium levels. The boil is improving, and we have a plan to manage the residual soreness. We also talked about restarting bupropion  for your depression and ensuring your potassium levels return to normal.  YOUR PLAN: -BOIL ON BUTTOCK: A boil is a painful, pus-filled bump under the skin caused by infected hair follicles. Your boil is improving, but some soreness remains. Keep the area covered with a bandage, apply petroleum jelly to prevent scabbing, and use acetaminophen  for any soreness. It will take several weeks to fully heal.  -DEPRESSION: Depression is a mood disorder that causes persistent feelings of sadness and loss of interest. Given your recent stressors, we recommend restarting bupropion  daily to help maintain emotional stability. Continue using buspirone  as needed for acute anxiety or stress.  -LOW POTASSIUM: Low potassium levels can cause weakness, fatigue, and muscle cramps. Your low potassium may be due to recent diarrhea. We provided potassium supplements and will recheck your potassium levels to ensure they return to normal.  INSTRUCTIONS: Please follow up to recheck your potassium levels to ensure they have normalized.                      Contains text generated by Abridge.                                 Contains text generated by Abridge.

## 2023-07-08 NOTE — Telephone Encounter (Signed)
 Reason for Referral Request:  Patient requested  Has Patient been seen by PCP for this complaint? States yes  No, Please schedule patient for appointment for complaint.  Yes, Please find out following information:  Reason: Patient Requested  Referral to which Specialty: Cardiology  Preferred office/ provider:  Did not state

## 2023-07-08 NOTE — Progress Notes (Signed)
 Subjective  CC:  Chief Complaint  Patient presents with   Hospitalization Follow-up    07/01/2023 - 07/02/2023 (4 hours) [facility] Emergency Department at [facility]  Hypokalemia Abscess    HPI: Taylor Hopkins is a 73 y.o. female who presents to the office today to address the problems listed above in the chief complaint. Discussed the use of AI scribe software for clinical note transcription with the patient, who gave verbal consent to proceed.  History of Present Illness Taylor Hopkins is a 73 year old female who presents with a boil on the bottom of her buttock.  She has a boil on the bottom of her buttock, which was initially large and draining from three places. It has improved significantly but remains somewhat sore. She completed a course of doxycycline  this morning and previously used pain medication, which she no longer requires. She now manages any residual soreness with Tylenol , as she cannot take Advil. I reviewed ER notes and lab results.   She has low potassium levels, which were addressed with potassium supplements. She has experienced some diarrhea, which may have contributed to the low potassium. No vomiting is reported.  She discusses her mental health, noting that she has stopped taking Klonopin  and bupropion , but continues with buspirone . She found bupropion  helpful in the past but stopped it because she felt she was on too much medication. Stopped about a week ago. See last note. She describes feeling better without Klonopin  and is considering restarting bupropion  due to ongoing stressors in her life, including her husband's recent health issues and her friend's dementia.   Assessment  1. Abscess of buttock   2. Hypokalemia   3. Major depression, recurrent, chronic (HCC)      Plan  Assessment and Plan Assessment & Plan Boil on buttock Boil improving with drainage. Infection resolved. Residual induration and soreness persist. Healing expected  over several weeks. - Keep area covered with bandage. - Apply petroleum jelly to prevent scabbing. - Use acetaminophen  for soreness and inflammation. - Allow several weeks for healing.  Depression Depression exacerbated by stressors. Bupropion  previously helpful. Klonopin  discontinued. Buspirone  used as needed. Restarting bupropion  encouraged. - Restart bupropion  daily to maintain emotional stability. - Use buspirone  as needed for acute anxiety or stress.  Low potassium Recent hypokalemia possibly due to diarrhea. Potassium supplementation provided. - Recheck potassium levels to ensure normalization.    Follow up: sept as scheduled for f/u chronic medical problems.  Orders Placed This Encounter  Procedures   Basic metabolic panel with GFR   No orders of the defined types were placed in this encounter.    I reviewed the patients updated PMH, FH, and SocHx.  Patient Active Problem List   Diagnosis Date Noted   Adenomatous polyp of colon 07/04/2021    Priority: High   Lumbar radiculopathy, chronic 07/26/2019    Priority: High   Prediabetes 07/26/2019    Priority: High   Insomnia 07/04/2018    Priority: High   Osteoporosis 07/04/2018    Priority: High   Major depression, recurrent, chronic (HCC) 07/04/2018    Priority: High   OSA (obstructive sleep apnea), not on CPAP 07/04/2018    Priority: High   Urinary, incontinence, stress female 03/28/2020    Priority: Medium    Urinary incontinence, mixed 03/28/2020    Priority: Medium    Gallstones 07/26/2019    Priority: Medium    S/P dilatation of esophageal stricture 07/23/2018    Priority: Medium  Colon polyp 07/04/2018    Priority: Medium    IBS (irritable bowel syndrome) 07/04/2018    Priority: Medium    Chronic neck pain due to DJD, DDD, unable to tolerate NSAIDs 07/04/2018    Priority: Medium    GERD (gastroesophageal reflux disease) 10/02/2008    Priority: Medium    Seasonal allergic rhinitis due to pollen  07/26/2019    Priority: Low   Vitamin D  deficiency 07/04/2018    Priority: Low   Peripheral neuropathy 05/11/2023   Mixed hyperlipidemia 05/11/2023   UGI bleed 10/09/2022   Hiatal hernia 10/09/2022   Notalgia paresthetica 08/11/2022   No outpatient medications have been marked as taking for the 07/08/23 encounter (Office Visit) with Luevenia Saha, MD.   Current Facility-Administered Medications for the 07/08/23 encounter (Office Visit) with Luevenia Saha, MD  Medication   denosumab  (PROLIA ) injection 60 mg   Allergies: Patient is allergic to fluoxetine , prozac  [fluoxetine  hcl], sulfonamide derivatives, adhesive  [tape], other, sulfa antibiotics, and wound dressing adhesive. Family History: Patient family history includes Allergies in her brother; Colon polyps in her father; Diabetes in her mother; Diverticulitis in her father; Esophageal cancer in her maternal grandmother; Heart attack in her father; Heart failure in her father and mother; Kidney failure in her mother; Mitral valve prolapse in her mother; Stomach cancer in her paternal uncle. Social History:  Patient  reports that she has never smoked. She has never used smokeless tobacco. She reports current alcohol use. She reports that she does not use drugs.  Review of Systems: Constitutional: Negative for fever malaise or anorexia Cardiovascular: negative for chest pain Respiratory: negative for SOB or persistent cough Gastrointestinal: negative for abdominal pain  Objective  Vitals: BP 133/72   Pulse 89   Temp 97.7 F (36.5 C)   Ht 5\' 6"  (1.676 m)   Wt 179 lb 3.2 oz (81.3 kg)   SpO2 98%   BMI 28.92 kg/m  General: no acute distress , A&Ox3 Psych: tearful Skin:  Warm, no rashes Left buttock w/ healing abscess, indurated w/o warmth and minimal erythema. Minimal ttp. No fluctuance.  Commons side effects, risks, benefits, and alternatives for medications and treatment plan prescribed today were discussed, and the  patient expressed understanding of the given instructions. Patient is instructed to call or message via MyChart if he/she has any questions or concerns regarding our treatment plan. No barriers to understanding were identified. We discussed Red Flag symptoms and signs in detail. Patient expressed understanding regarding what to do in case of urgent or emergency type symptoms.  Medication list was reconciled, printed and provided to the patient in AVS. Patient instructions and summary information was reviewed with the patient as documented in the AVS. This note was prepared with assistance of Dragon voice recognition software. Occasional wrong-word or sound-a-like substitutions may have occurred due to the inherent limitations of voice recognition software

## 2023-07-10 ENCOUNTER — Telehealth: Payer: Self-pay | Admitting: Family Medicine

## 2023-07-10 NOTE — Telephone Encounter (Unsigned)
 Copied from CRM [id number]. Topic: Clinical - Medical Advice >> Jul 10, 2023  2:16 PM Clyde Darling P wrote: Reason for CRM: pt would like to know what otc medicine to take for low potassium and how much a day to take it for- pt can be reached [phone]

## 2023-07-14 ENCOUNTER — Ambulatory Visit: Payer: Self-pay | Admitting: Family Medicine

## 2023-07-14 DIAGNOSIS — E876 Hypokalemia: Secondary | ICD-10-CM

## 2023-07-14 MED ORDER — POTASSIUM CHLORIDE CRYS ER 20 MEQ PO TBCR: 20.0000 meq | EXTENDED_RELEASE_TABLET | Freq: Every day | ORAL | 0 refills | Status: DC

## 2023-07-14 NOTE — Progress Notes (Signed)
 See mychart note Dear Taylor Hopkins, Your potassium remains on the low side.  I have ordered more potassium supplements to take to try to correct it. Please take the prescription potassium as ordered. Then schedule a lab visit in about 2 weeks so I can recheck it.  Thank you! Sincerely, Dr. Jonelle Neri

## 2023-07-27 ENCOUNTER — Telehealth: Payer: Self-pay

## 2023-07-27 ENCOUNTER — Telehealth: Payer: Self-pay | Admitting: Family Medicine

## 2023-07-27 NOTE — Telephone Encounter (Signed)
 Patient requests to be called re: status of Potassium medication request. Patient is scheduled for OV on 08/17/23.

## 2023-07-27 NOTE — Telephone Encounter (Signed)
 Copied from CRM [id number]. Topic: Clinical - Medication Question >> Jul 27, 2023 11:11 AM Albertha Alosa wrote: Reason for CRM: Patient called in regarding the potassium chloride  (KLOR-CON  M) 20 MEQ tablet , stated she was suppose to take them for 5 days, patient stated when she took the first one she ended up throwing it up, so she didn't get the full 5 days wanted to know what does she needs to do next, would like a callback regarding this  Message was sent tot provider to address

## 2023-08-17 ENCOUNTER — Encounter: Payer: Self-pay | Admitting: Family Medicine

## 2023-08-17 ENCOUNTER — Ambulatory Visit: Admitting: Family Medicine

## 2023-08-17 VITALS — BP 135/78 | HR 78 | Temp 97.7°F | Ht 66.0 in | Wt 176.0 lb

## 2023-08-17 DIAGNOSIS — F5101 Primary insomnia: Secondary | ICD-10-CM | POA: Diagnosis not present

## 2023-08-17 DIAGNOSIS — K2100 Gastro-esophageal reflux disease with esophagitis, without bleeding: Secondary | ICD-10-CM

## 2023-08-17 DIAGNOSIS — E876 Hypokalemia: Secondary | ICD-10-CM

## 2023-08-17 DIAGNOSIS — G629 Polyneuropathy, unspecified: Secondary | ICD-10-CM

## 2023-08-17 DIAGNOSIS — F339 Major depressive disorder, recurrent, unspecified: Secondary | ICD-10-CM

## 2023-08-17 LAB — BASIC METABOLIC PANEL WITH GFR
BUN: 13 mg/dL (ref 6–23)
CO2: 28 meq/L (ref 19–32)
Calcium: 9.5 mg/dL (ref 8.4–10.5)
Chloride: 106 meq/L (ref 96–112)
Creatinine, Ser: 0.83 mg/dL (ref 0.40–1.20)
GFR: 69.72 mL/min (ref >60.00)
Glucose, Bld: 91 mg/dL (ref 70–99)
Potassium: 3.3 meq/L — ABNORMAL LOW (ref 3.5–5.1)
Sodium: 143 meq/L (ref 135–145)

## 2023-08-17 LAB — MAGNESIUM: Magnesium: 2 mg/dL (ref 1.5–2.5)

## 2023-08-17 MED ORDER — GABAPENTIN 300 MG PO CAPS: 300.0000 mg | ORAL_CAPSULE | Freq: Every day | ORAL | 3 refills | Status: AC

## 2023-08-17 NOTE — Patient Instructions (Signed)
 Please follow up as scheduled for your next visit with me: 11/11/2023   I will release your lab results to you on your MyChart account with further instructions. You may see the results before I do, but when I review them I will send you a message with my report or have my assistant call you if things need to be discussed. Please reply to my message with any questions. Thank you!   If you have any questions or concerns, please don't hesitate to send me a message via MyChart or call the office at [phone]. Thank you for visiting with us  today! It's our pleasure caring for you.    VISIT SUMMARY: During your visit, we discussed your sleep disturbances, neuropathy, dry eyes, gastrointestinal issues, and overall health. We reviewed your current medications and made some adjustments to better manage your symptoms.  YOUR PLAN: -PERIPHERAL NEUROPATHY: Peripheral neuropathy is a condition that causes tingling and sharp pain in your feet, especially at night. We will start you on gabapentin  to help manage these symptoms and improve your sleep.  -INSOMNIA: Your sleep has been improving, and you can continue using trazodone  as needed to help with sleep disturbances.  -DEPRESSION: You have restarted bupropion , which has helped improve your mood. Please continue taking this medication for mood stability.  -DYSPHAGIA: Dysphagia is difficulty swallowing. Given your history and current issues with swallowing large pills, you should follow up with a gastroenterologist for further evaluation.  -IRRITABLE BOWEL SYNDROME (IBS): IBS causes intermittent diarrhea and constipation, which may be related to stress and diet. Continue following up with your gastroenterologist and practice stress management techniques like meditation and breathing exercises.  -DRY EYES: Your eyes feel gritty and dry, and over-the-counter drops have not helped. Please consult with your eye doctor for better management of your dry  eyes.  INSTRUCTIONS: Please follow up with a gastroenterologist for your swallowing difficulties and IBS management. Additionally, schedule an appointment with your eye doctor to address your dry eyes. Continue taking your medications as prescribed and practice stress management techniques.                      Contains text generated by Abridge.                                 Contains text generated by Abridge.

## 2023-08-17 NOTE — Progress Notes (Signed)
 Subjective  CC:  Chief Complaint  Patient presents with   hypokalemia    Recheck potassium   Toe Pain    Pt stated that she has been experiencing some toe pain/tingle for the past 6mos. No change. Also experiencing dome dry eyes and wanted some thing to help    HPI: Taylor Hopkins is a 73 y.o. female who presents to the office today to address the problems listed above in the chief complaint. Discussed the use of AI scribe software for clinical note transcription with the patient, who gave verbal consent to proceed.  History of Present Illness Taylor Hopkins is a 73 year old female who presents with sleep disturbances and neuropathy. And f/u low potassium.  F/u insomnia: no longer needing/using klonopin . Worked well when she was overly anxious, but too strong now. Her mood is improved. See below. She experiences sleep disturbances and occasionally uses trazodone , taking two pills when necessary, though she has not used it recently. Her sleep has been improving, and she currently has enough medication on hand.  She has numbness and tingling in her feet, described as sharp pain lasting about ten seconds, occurring for over six months, primarily at night but not every night. She has not had a work-up for this issue, and it has been referred to as neuropathy in the past. She has previously taken gabapentin  for this condition.  She has dry eyes, using Systane and other over-the-counter drops without relief. Her eyes feel gritty and dry, but she has not seen her eye doctor in the past year.  She experiences gastrointestinal issues, including difficulty swallowing large pills, occasional diarrhea, and a history of a hiatal hernia. She has had her esophagus stretched in the past due to swallowing difficulties. Diarrhea is not a regular occurrence and may be stress-related. She also experiences constipation at times.  Her current medications include trazodone  for sleep, Wellbutrin   (bupropion ), buspirone  as needed for anxiety, and she has previously used clonazepam  but found it too strong. Mood is much better. Still gets stressed. No panic sxs.   Hypokalemia; repleted with dur 20 daily x 4. Here for repeat. Thought to be related to intermittent diarrhea related to IBS Assessment  1. Hypokalemia   2. Major depression, recurrent, chronic (HCC)   3. Peripheral polyneuropathy   4. Primary insomnia   5. Gastroesophageal reflux disease with esophagitis without hemorrhage      Plan  Assessment and Plan Assessment & Plan Peripheral Neuropathy Worsening tingling and sharp pain in feet, primarily at night. Suspected peripheral neuropathy. Gabapentin  discussed for symptom management and potential sleep aid. - Prescribe gabapentin  to be taken at night for neuropathy symptoms. 300 at bedtime. May be able to stop trazodone .   Insomnia Improved sleep with intermittent trazodone  use. Waking at 3-4 AM but overall better sleep. Trazodone  deemed safe for as-needed use. - Continue trazodone  as needed for sleep.  Depression Restarted bupropion  with some mood improvement. Encouraged to continue for mood stability. - Continue bupropion .  Dysphagia Difficulty swallowing large potassium pills, with previous esophageal dilation. Advised to follow up with gastroenterologist. - Refer to gastroenterologist for evaluation of dysphagia.  Irritable Bowel Syndrome (IBS) Intermittent diarrhea and constipation, possibly stress and diet-related. Advised to continue gastroenterologist follow-up and stress management techniques. - Continue follow-up with gastroenterologist for IBS management. - Encourage stress management techniques such as meditation and breathing exercises.  Dry Eyes Gritty and dry eyes unrelieved by Systane drops. Recommended to consult with eye doctor. - Consult  with eye doctor for management of dry eyes.  Recheck potassium.   Follow up: as scheduled. Orders Placed  This Encounter  Procedures   Basic metabolic panel with GFR   Magnesium   Meds ordered this encounter  Medications   gabapentin  (NEURONTIN ) 300 MG capsule    Sig: Take 1 capsule (300 mg total) by mouth at bedtime.    Dispense:  90 capsule    Refill:  3     I reviewed the patients updated PMH, FH, and SocHx.  Patient Active Problem List   Diagnosis Date Noted   Mixed hyperlipidemia 05/11/2023    Priority: High   Adenomatous polyp of colon 07/04/2021    Priority: High   Lumbar radiculopathy, chronic 07/26/2019    Priority: High   Prediabetes 07/26/2019    Priority: High   Insomnia 07/04/2018    Priority: High   Osteoporosis 07/04/2018    Priority: High   Major depression, recurrent, chronic (HCC) 07/04/2018    Priority: High   OSA (obstructive sleep apnea), not on CPAP 07/04/2018    Priority: High   Peripheral neuropathy 05/11/2023    Priority: Medium    UGI bleed 10/09/2022    Priority: Medium    Hiatal hernia 10/09/2022    Priority: Medium    Urinary, incontinence, stress female 03/28/2020    Priority: Medium    Urinary incontinence, mixed 03/28/2020    Priority: Medium    Gallstones 07/26/2019    Priority: Medium    S/P dilatation of esophageal stricture 07/23/2018    Priority: Medium    Colon polyp 07/04/2018    Priority: Medium    IBS (irritable bowel syndrome) 07/04/2018    Priority: Medium    Chronic neck pain due to DJD, DDD, unable to tolerate NSAIDs 07/04/2018    Priority: Medium    GERD (gastroesophageal reflux disease) 10/02/2008    Priority: Medium    Notalgia paresthetica 08/11/2022    Priority: Low   Seasonal allergic rhinitis due to pollen 07/26/2019    Priority: Low   Vitamin D  deficiency 07/04/2018    Priority: Low   Current Meds  Medication Sig   atorvastatin  (LIPITOR) 10 MG tablet TAKE 1 TABLET BY MOUTH EVERYDAY AT BEDTIME   BIOTIN PO Take 3,000 mcg by mouth.   buPROPion  (WELLBUTRIN  SR) 150 MG 12 hr tablet Take 1 tablet (150 mg total)  by mouth 2 (two) times daily.   busPIRone  (BUSPAR ) 7.5 MG tablet Take 7.5 mg by mouth 3 (three) times daily as needed.   clobetasol  (TEMOVATE ) 0.05 % external solution Apply 1 Application topically 2 (two) times daily.   clonazePAM  (KLONOPIN ) 1 MG tablet Take 0.5-1 tablets (0.5-1 mg total) by mouth at bedtime as needed for anxiety.   dexlansoprazole  (DEXILANT ) 60 MG capsule Take 1 capsule (60 mg total) by mouth daily.   gabapentin  (NEURONTIN ) 300 MG capsule Take 1 capsule (300 mg total) by mouth at bedtime.   oxybutynin  (DITROPAN  XL) 10 MG 24 hr tablet Take 10 mg by mouth at bedtime.   oxyCODONE -acetaminophen  (PERCOCET/ROXICET) 5-325 MG tablet Take 1 tablet by mouth every 6 (six) hours as needed for severe pain (pain score 7-10).   PROLIA  60 MG/ML SOSY injection Inject 60 mg into the skin every 6 (six) months.   traZODone  (DESYREL ) 50 MG tablet TAKE 0.5-1 TABLETS (25-50 MG TOTAL) BY MOUTH AT BEDTIME AS NEEDED. FOR SLEEP   valACYclovir  (VALTREX ) 1000 MG tablet TAKE 2 TABLETS BY MOUTH 2 (TWO) TIMES DAILY. ONCE, AS  NEEDED FOR COLD SORES   Vitamin D , Ergocalciferol , (DRISDOL ) 1.25 MG (50000 UNIT) CAPS capsule TAKE 1 CAPSULE BY MOUTH ONE TIME PER WEEK   [DISCONTINUED] potassium chloride  (KLOR-CON  M) 20 MEQ tablet Take 1 tablet (20 mEq total) by mouth daily for 5 days.   Current Facility-Administered Medications for the 08/17/23 encounter (Office Visit) with Jodie Lavern CROME, MD  Medication   denosumab  (PROLIA ) injection 60 mg   Allergies: Patient is allergic to fluoxetine , prozac  [fluoxetine  hcl], sulfonamide derivatives, adhesive  [tape], other, sulfa antibiotics, and wound dressing adhesive. Family History: Patient family history includes Allergies in her brother; Colon polyps in her father; Diabetes in her mother; Diverticulitis in her father; Esophageal cancer in her maternal grandmother; Heart attack in her father; Heart failure in her father and mother; Kidney failure in her mother; Mitral valve  prolapse in her mother; Stomach cancer in her paternal uncle. Social History:  Patient  reports that she has never smoked. She has never used smokeless tobacco. She reports current alcohol use. She reports that she does not use drugs.  Review of Systems: Constitutional: Negative for fever malaise or anorexia Cardiovascular: negative for chest pain Respiratory: negative for SOB or persistent cough Gastrointestinal: negative for abdominal pain  Objective  Vitals: BP 135/78   Pulse 78   Temp 97.7 F (36.5 C)   Ht 5' 6 (1.676 m)   Wt 176 lb (79.8 kg)   SpO2 97%   BMI 28.41 kg/m  General: no acute distress , A&Ox3 No visits with results within 1 Day(s) from this visit.  Latest known visit with results is:  Office Visit on 07/08/2023  Component Date Value Ref Range Status   Sodium 07/08/2023 141  135 - 145 mEq/L Final   Potassium 07/08/2023 3.3 (L)  3.5 - 5.1 mEq/L Final   Chloride 07/08/2023 103  96 - 112 mEq/L Final   CO2 07/08/2023 28  19 - 32 mEq/L Final   Glucose, Bld 07/08/2023 97  70 - 99 mg/dL Final   BUN 94/85/7974 16  6 - 23 mg/dL Final   Creatinine, Ser 07/08/2023 0.76  0.40 - 1.20 mg/dL Final   GFR 94/85/7974 77.55  >60.00 mL/min Final   Calcium  07/08/2023 9.7  8.4 - 10.5 mg/dL Final    Commons side effects, risks, benefits, and alternatives for medications and treatment plan prescribed today were discussed, and the patient expressed understanding of the given instructions. Patient is instructed to call or message via MyChart if he/she has any questions or concerns regarding our treatment plan. No barriers to understanding were identified. We discussed Red Flag symptoms and signs in detail. Patient expressed understanding regarding what to do in case of urgent or emergency type symptoms.  Medication list was reconciled, printed and provided to the patient in AVS. Patient instructions and summary information was reviewed with the patient as documented in the AVS. This note  was prepared with assistance of Dragon voice recognition software. Occasional wrong-word or sound-a-like substitutions may have occurred due to the inherent limitations of voice recognition software

## 2023-08-19 ENCOUNTER — Telehealth: Payer: Self-pay

## 2023-08-19 NOTE — Telephone Encounter (Signed)
 Copied from CRM [id number]. Topic: Clinical - Lab/Test Results >> Aug 19, 2023  8:16 AM Burnard DEL wrote: Reason for CRM: Patient seen her lab results through mychart,and she has a few questions regarding her potassium levels? I informed her that the provider hasn't had a chance to review her labs just yet,but once reviewed someone would be in contact with her.  Please Advise  Message has been sent to provider tro address

## 2023-08-22 ENCOUNTER — Other Ambulatory Visit: Payer: Self-pay | Admitting: Family Medicine

## 2023-08-22 DIAGNOSIS — E785 Hyperlipidemia, unspecified: Secondary | ICD-10-CM

## 2023-08-24 ENCOUNTER — Ambulatory Visit: Payer: Self-pay | Admitting: Family Medicine

## 2023-08-24 DIAGNOSIS — E876 Hypokalemia: Secondary | ICD-10-CM

## 2023-08-24 NOTE — Progress Notes (Signed)
 Please call patient:potassium remains just a little low. Need to work up why potassium is running low .... Need more blood work; please schedule a lab visit. Please ensure she is not having any chronic or persistent diarrhea which could cause mildly low potassium levels as well.  I have ordered future labs. Thanks.

## 2023-08-25 ENCOUNTER — Telehealth: Payer: Self-pay | Admitting: Family Medicine

## 2023-08-25 ENCOUNTER — Other Ambulatory Visit: Payer: Self-pay | Admitting: Family Medicine

## 2023-08-25 ENCOUNTER — Other Ambulatory Visit (INDEPENDENT_AMBULATORY_CARE_PROVIDER_SITE_OTHER)

## 2023-08-25 DIAGNOSIS — E876 Hypokalemia: Secondary | ICD-10-CM

## 2023-08-25 LAB — BASIC METABOLIC PANEL WITH GFR
BUN: 13 mg/dL (ref 6–23)
CO2: 29 meq/L (ref 19–32)
Calcium: 9.5 mg/dL (ref 8.4–10.5)
Chloride: 104 meq/L (ref 96–112)
Creatinine, Ser: 0.81 mg/dL (ref 0.40–1.20)
GFR: 71.78 mL/min (ref >60.00)
Glucose, Bld: 96 mg/dL (ref 70–99)
Potassium: 3.3 meq/L — ABNORMAL LOW (ref 3.5–5.1)
Sodium: 142 meq/L (ref 135–145)

## 2023-08-25 NOTE — Telephone Encounter (Signed)
LVM to schedule Lab appointment

## 2023-08-26 ENCOUNTER — Telehealth: Payer: Self-pay | Admitting: Family Medicine

## 2023-08-26 NOTE — Telephone Encounter (Signed)
 Copied from CRM [id number]. Topic: Clinical - Medical Advice >> Aug 26, 2023 10:34 AM Suzen RAMAN wrote: Reason for CRM: Patient would like a call back to discuss recommendation to improve her potassium levels. Patient has had a blood draw 3x and potassium levels have not improved with medication. CB#[phone].

## 2023-08-27 ENCOUNTER — Telehealth: Payer: Self-pay

## 2023-08-27 NOTE — Telephone Encounter (Signed)
 Copied from CRM [id number]. Topic: Clinical - Medical Advice >> Aug 26, 2023 10:34 AM Suzen RAMAN wrote: Reason for CRM: Patient would like a call back to discuss recommendation to improve her potassium levels. Patient has had a blood draw 3x and potassium levels have not improved with medication. RA#[phone]. >> Aug 27, 2023  8:50 AM Thersia C wrote: Patient called back in stating no one has called her regarding her potassium, would like for a nurse to give her a callback regarding this because she is not feeling good.   Returned pt call and patient is stating she is concerned since this has been last 3 labs potassium is remaining to stay low. Confirmed with patient no chronic or persistent diarrhea. Pt admits just the opposite, being constipated at times. Advised pt would send info to PCP and see what next steps or recommendations are. Pt advised she doesn't want to continue to come in office and pay 120.00 for visits to discuss recommendations. Please advise patient via Mychart/phone PCP advice.

## 2023-08-28 ENCOUNTER — Ambulatory Visit: Payer: Self-pay | Admitting: Family Medicine

## 2023-08-28 DIAGNOSIS — E876 Hypokalemia: Secondary | ICD-10-CM

## 2023-08-28 NOTE — Progress Notes (Signed)
 See mychart note Dear Taylor Hopkins, As you can see, your potassium remains on the low normal side. I am awaiting the aldosterone/renin activity test result which will help me know if you are having a problem with your adrenals. Once this test returns, I will let you know the next steps that are needed: they may include imaging tests of your adrenal glands, new medications and/or a referral to endocrinology.  Thank you for your patience.  Dr. Jodie

## 2023-08-29 LAB — ALDOSTERONE + RENIN ACTIVITY W/ RATIO
ALDO / PRA Ratio: 21.1 ratio (ref 0.9–28.9)
Aldosterone: 15 ng/dL
Renin Activity: 0.71 ng/mL/h (ref 0.25–5.82)

## 2023-08-30 NOTE — Progress Notes (Signed)
 See mychart note Dear Ms. Kareem, Your remaining blood test results are normal. I recommend seeing an endocrinologist to further help identify why your potassium is remaining low. I have placed a referral.  Sincerely, Dr. Jodie

## 2023-08-31 NOTE — Telephone Encounter (Signed)
 Pt has seen [employer].

## 2023-09-02 NOTE — Telephone Encounter (Signed)
 Prior Authorization initiated for PROLIA  via [facility] Provider portal.  Case ID: [id number]

## 2023-09-08 ENCOUNTER — Other Ambulatory Visit: Payer: Self-pay | Admitting: Family Medicine

## 2023-09-08 DIAGNOSIS — K2100 Gastro-esophageal reflux disease with esophagitis, without bleeding: Secondary | ICD-10-CM

## 2023-09-08 DIAGNOSIS — R1013 Epigastric pain: Secondary | ICD-10-CM

## 2023-09-08 NOTE — Telephone Encounter (Signed)
 [employer]  Prior Authorization for PROLIA  APPROVED PA# [id number] Valid: 09/02/23-09/01/24

## 2023-09-26 NOTE — Telephone Encounter (Signed)
 Medical Buy and Bill  Patient ready for scheduling on or after 09/02/23  Out-of-pocket cost due at time of visit: $289  Primary: [employer] Medicare Adv L-PPO Prolia  co-insurance: 15%  (approximately $249) Admin fee co-insurance: $40  Deductible: $125 of $125 met  Prior Auth: APPROVED PA# [id number] Valid: 09/02/23-09/01/24  Secondary: N/A Prolia  co-insurance:  Admin fee co-insurance:  Deductible:  Prior Auth:  PA# Valid:     ** This summary of benefits is an estimation of the patient's out-of-pocket cost. Exact cost may very based on individual plan coverage.

## 2023-09-28 NOTE — Telephone Encounter (Signed)
Please schedule pt for Prolia injection

## 2023-10-02 ENCOUNTER — Other Ambulatory Visit: Payer: Self-pay | Admitting: Family Medicine

## 2023-10-02 DIAGNOSIS — F5101 Primary insomnia: Secondary | ICD-10-CM

## 2023-10-02 NOTE — Telephone Encounter (Signed)
 Last OV 08/17/23 Next OV 11/11/23  Last refill(s) Bupropion  10/09/22 Qty #180/3  Trazodone  10/22/22 Qty #90/3

## 2023-10-07 NOTE — Telephone Encounter (Signed)
 Note:

## 2023-10-07 NOTE — Telephone Encounter (Signed)
 Called to schedule pt for prolia . Patient opted to have prolia  injection at the time of her OV w/ PCP on 11/11/23.

## 2023-10-25 ENCOUNTER — Other Ambulatory Visit: Payer: Self-pay | Admitting: Family Medicine

## 2023-10-28 ENCOUNTER — Telehealth: Payer: Self-pay | Admitting: Family Medicine

## 2023-10-28 ENCOUNTER — Other Ambulatory Visit: Payer: Self-pay

## 2023-10-28 DIAGNOSIS — E876 Hypokalemia: Secondary | ICD-10-CM

## 2023-10-28 LAB — CREATININE, URINE, RANDOM: Creatinine, POC: 164.5 mg/dL

## 2023-10-28 NOTE — Telephone Encounter (Unsigned)
 Copied from CRM #[id number]. Topic: Referral - Question >> Oct 28, 2023 12:44 PM Henretta I wrote: Reason for CRM: Patient needs referral for endocrinologists and records transferred over to Dr. Reyes Alexander until she can see Dr. Jodie. She would like it sent to Dr. Reyes Alexander, he is located at  [address], [place], [place] [address]. Patient would like a call back with updates.

## 2023-11-03 LAB — HEPATIC FUNCTION PANEL
ALT: 10 U/L (ref 7–35)
AST: 17 (ref 13–35)
Alkaline Phosphatase: 46 (ref 25–125)

## 2023-11-03 LAB — COMPREHENSIVE METABOLIC PANEL WITH GFR: eGFR: 83

## 2023-11-03 LAB — BASIC METABOLIC PANEL WITH GFR
BUN: 16 (ref 4–21)
CO2: 33 — AB (ref 13–22)
Creatinine: 0.8 (ref 0.5–1.1)
Glucose: 87
Potassium: 3.8 meq/L (ref 3.5–5.1)
Sodium: 141 (ref 137–147)

## 2023-11-03 LAB — COMPREHENSIVE METABOLIC PANEL (CC13): EGFR: 83

## 2023-11-11 ENCOUNTER — Encounter: Payer: Self-pay | Admitting: Family Medicine

## 2023-11-11 ENCOUNTER — Ambulatory Visit: Admitting: Family Medicine

## 2023-11-11 VITALS — BP 135/83 | HR 78 | Temp 97.7°F | Ht 66.0 in | Wt 178.8 lb

## 2023-11-11 DIAGNOSIS — E876 Hypokalemia: Secondary | ICD-10-CM | POA: Diagnosis not present

## 2023-11-11 DIAGNOSIS — F339 Major depressive disorder, recurrent, unspecified: Secondary | ICD-10-CM

## 2023-11-11 DIAGNOSIS — N3946 Mixed incontinence: Secondary | ICD-10-CM

## 2023-11-11 DIAGNOSIS — G8929 Other chronic pain: Secondary | ICD-10-CM

## 2023-11-11 DIAGNOSIS — M810 Age-related osteoporosis without current pathological fracture: Secondary | ICD-10-CM

## 2023-11-11 DIAGNOSIS — F5104 Psychophysiologic insomnia: Secondary | ICD-10-CM

## 2023-11-11 DIAGNOSIS — M542 Cervicalgia: Secondary | ICD-10-CM

## 2023-11-11 DIAGNOSIS — M5416 Radiculopathy, lumbar region: Secondary | ICD-10-CM

## 2023-11-11 DIAGNOSIS — Z23 Encounter for immunization: Secondary | ICD-10-CM | POA: Diagnosis not present

## 2023-11-11 MED ORDER — DENOSUMAB 60 MG/ML ~~LOC~~ SOSY: 60.0000 mg | PREFILLED_SYRINGE | Freq: Once | SUBCUTANEOUS | Status: AC

## 2023-11-11 NOTE — Addendum Note (Signed)
 Addended by: Olar Santini A on: 11/11/2023 10:14 AM   Modules accepted: Orders

## 2023-11-11 NOTE — Progress Notes (Signed)
 Subjective  CC:  Chief Complaint  Patient presents with   Osteoporosis   Hyperlipidemia    HPI: Taylor Hopkins is a 73 y.o. female who presents to the office today to address the problems listed above in the chief complaint. Discussed the use of AI scribe software for clinical note transcription with the patient, who gave verbal consent to proceed.  History of Present Illness Taylor Hopkins is a 73 year old female who presents for follow-up on osteoporosis, mood, and chronic problems, lab results and Prolia  injection.  Saw endocrinology for persistent hypokalemia.  She does bring in lab work from September 3.  Potassium was up to 3.8.  Unclear etiology, possible nutritional deficiency.  I will call for records.  She saw Dr. Faythe.  She has experienced low potassium levels over the past year, which have recently normalized. She reports making dietary changes, including eating more salmon and sweet potatoes. She previously had poor dietary habits.  She is currently receiving Prolia  injections for bone health and tolerated the last injection well.  Due her second Prolia  injection.  Tolerated her first well.  Has osteoporosis.  Discussed the importance of consistent use.  She reports ongoing bladder issues despite being on medication, though specific symptoms and the medication name were not provided.  Mixed urinary incontinence on medication.  No irritative symptoms present  Hyperlipidemia is well-controlled on atorvastatin  10.  LDL is 30  Eligible for flu shot today.  Mood is mostly well-controlled on Wellbutrin   Insomnia is much improved on trazodone , using as needed.   Assessment  1. Age related osteoporosis, unspecified pathological fracture presence   2. Need for influenza vaccination   3. Psychophysiological insomnia   4. Hypokalemia   5. Major depression, recurrent, chronic (HCC)   6. Lumbar radiculopathy, chronic   7. Chronic neck pain due to DJD, DDD, unable to  tolerate NSAIDs   8. Urinary incontinence, mixed      Plan  Assessment and Plan Assessment & Plan Age-related osteoporosis without current pathological fracture Continued Prolia  is essential to prevent fractures. Emphasized cost-effectiveness over six months. Adherence is crucial to reduce hip fracture risk. - Administer Prolia  injection number two. - Continue Prolia  for a minimum of two years. - Reinforce the importance of adherence to Prolia  regimen. - Continue vitamin D  calcium  and recommend weightbearing exercise  Mixed incontinence No changes in management discussed.  Psychophysiologic insomnia Advised on medication use to ensure consistent sleep quality. - Advise to take insomnia medication nightly or as needed, but not to delay until sleep deprivation is severe.  Hypokalemia Potassium levels normalized. Dietary improvements noted.  Will request records   Hyperlipidemia is at goal Continue mood medications Following with neurosurgery for chronic back pain.  General Health Maintenance Received flu shot after addressing cost concerns. - Ensure flu vaccination is up to date.   Follow up: 6 months for complete physical and chronic problem follow-up Orders Placed This Encounter  Procedures   Flu vaccine HIGH DOSE PF(Fluzone Trivalent)   No orders of the defined types were placed in this encounter.    I reviewed the patients updated PMH, FH, and SocHx.  Patient Active Problem List   Diagnosis Date Noted   Mixed hyperlipidemia 05/11/2023    Priority: High   Adenomatous polyp of colon 07/04/2021    Priority: High   Lumbar radiculopathy, chronic 07/26/2019    Priority: High   Prediabetes 07/26/2019    Priority: High   Insomnia 07/04/2018  Priority: High   Osteoporosis 07/04/2018    Priority: High   Major depression, recurrent, chronic (HCC) 07/04/2018    Priority: High   OSA (obstructive sleep apnea), not on CPAP 07/04/2018    Priority: High   Peripheral  neuropathy 05/11/2023    Priority: Medium    UGI bleed 10/09/2022    Priority: Medium    Hiatal hernia 10/09/2022    Priority: Medium    Urinary, incontinence, stress female 03/28/2020    Priority: Medium    Urinary incontinence, mixed 03/28/2020    Priority: Medium    Gallstones 07/26/2019    Priority: Medium    S/P dilatation of esophageal stricture 07/23/2018    Priority: Medium    Colon polyp 07/04/2018    Priority: Medium    IBS (irritable bowel syndrome) 07/04/2018    Priority: Medium    Chronic neck pain due to DJD, DDD, unable to tolerate NSAIDs 07/04/2018    Priority: Medium    GERD (gastroesophageal reflux disease) 10/02/2008    Priority: Medium    Notalgia paresthetica 08/11/2022    Priority: Low   Seasonal allergic rhinitis due to pollen 07/26/2019    Priority: Low   Vitamin D  deficiency 07/04/2018    Priority: Low   Current Meds  Medication Sig   atorvastatin  (LIPITOR) 10 MG tablet TAKE 1 TABLET BY MOUTH EVERYDAY AT BEDTIME   BIOTIN PO Take 3,000 mcg by mouth.   buPROPion  (WELLBUTRIN  SR) 150 MG 12 hr tablet TAKE 1 TABLET BY MOUTH TWICE A DAY   busPIRone  (BUSPAR ) 7.5 MG tablet TAKE 1 TABLET (7.5 MG TOTAL) BY MOUTH 3 (THREE) TIMES DAILY AS NEEDED.   clobetasol  (TEMOVATE ) 0.05 % external solution Apply 1 Application topically 2 (two) times daily.   dexlansoprazole  (DEXILANT ) 60 MG capsule TAKE 1 CAPSULE BY MOUTH DAILY   oxybutynin  (DITROPAN  XL) 10 MG 24 hr tablet Take 10 mg by mouth at bedtime.   PROLIA  60 MG/ML SOSY injection Inject 60 mg into the skin every 6 (six) months.   traZODone  (DESYREL ) 50 MG tablet TAKE 0.5-1 TABLETS (25-50 MG TOTAL) BY MOUTH AT BEDTIME AS NEEDED. FOR SLEEP   valACYclovir  (VALTREX ) 1000 MG tablet TAKE 2 TABLETS BY MOUTH 2 (TWO) TIMES DAILY. ONCE, AS NEEDED FOR COLD SORES   Vitamin D , Ergocalciferol , (DRISDOL ) 1.25 MG (50000 UNIT) CAPS capsule TAKE 1 CAPSULE BY MOUTH ONE TIME PER WEEK   Current Facility-Administered Medications for the  11/11/23 encounter (Office Visit) with Jodie Lavern CROME, MD  Medication   denosumab  (PROLIA ) injection 60 mg   Allergies: Patient is allergic to fluoxetine , prozac  [fluoxetine  hcl], sulfonamide derivatives, adhesive  [tape], other, sulfa antibiotics, and wound dressing adhesive. Family History: Patient family history includes Allergies in her brother; Colon polyps in her father; Diabetes in her mother; Diverticulitis in her father; Esophageal cancer in her maternal grandmother; Heart attack in her father; Heart failure in her father and mother; Kidney failure in her mother; Mitral valve prolapse in her mother; Stomach cancer in her paternal uncle. Social History:  Patient  reports that she has never smoked. She has never used smokeless tobacco. She reports current alcohol use. She reports that she does not use drugs.  Review of Systems: Constitutional: Negative for fever malaise or anorexia Cardiovascular: negative for chest pain Respiratory: negative for SOB or persistent cough Gastrointestinal: negative for abdominal pain  Objective  Vitals: BP 135/83   Pulse 78   Temp 97.7 F (36.5 C)   Ht 5' 6 (1.676 m)  Wt 178 lb 12.8 oz (81.1 kg)   SpO2 99%   BMI 28.86 kg/m  General: no acute distress , A&Ox3  Commons side effects, risks, benefits, and alternatives for medications and treatment plan prescribed today were discussed, and the patient expressed understanding of the given instructions. Patient is instructed to call or message via MyChart if he/she has any questions or concerns regarding our treatment plan. No barriers to understanding were identified. We discussed Red Flag symptoms and signs in detail. Patient expressed understanding regarding what to do in case of urgent or emergency type symptoms.  Medication list was reconciled, printed and provided to the patient in AVS. Patient instructions and summary information was reviewed with the patient as documented in the AVS. This note  was prepared with assistance of Dragon voice recognition software. Occasional wrong-word or sound-a-like substitutions may have occurred due to the inherent limitations of voice recognition software

## 2023-11-11 NOTE — Patient Instructions (Addendum)
 Please return in 6 months for your annual complete physical; please come fasting.  For follow up on chronic medical conditions   If you have any questions or concerns, please don't hesitate to send me a message via MyChart or call the office at [phone]. Thank you for visiting with us  today! It's our pleasure caring for you.    VISIT SUMMARY: Today, we reviewed your lab results and administered your Prolia  injection. We discussed your ongoing bladder issues, sleep quality, and dietary improvements. You also received a flu shot recently.  YOUR PLAN: -AGE-RELATED OSTEOPOROSIS: Age-related osteoporosis is a condition where bones become weak and are more likely to break as you get older. We administered your second Prolia  injection today, and it is important to continue these injections every six months for at least two years to help prevent fractures. Adhering to this treatment plan is crucial to reduce the risk of hip fractures.  -MIXED INCONTINENCE: Mixed incontinence is a condition where you experience both stress incontinence and urge incontinence. We did not make any changes to your current management plan today.  -PSYCHOPHYSIOLOGIC INSOMNIA: Psychophysiologic insomnia is a condition where stress and anxiety affect your ability to sleep. It is important to take your insomnia medication nightly or as needed to ensure consistent sleep quality, but do not wait until you are severely sleep-deprived to take it.  -HYPOKALEMIA: Hypokalemia is a condition where your blood has low levels of potassium. Your potassium levels have normalized, and your dietary improvements, such as eating more salmon and sweet potatoes, have been beneficial.  -GENERAL HEALTH MAINTENANCE: You received a flu shot recently, which is important for preventing the flu. Make sure to keep your flu vaccinations up to date.  INSTRUCTIONS: Please continue with your Prolia  injections every six months for at least two years. Take  your insomnia medication as advised. Keep up with your dietary improvements to maintain normal potassium levels. Ensure your flu vaccinations are up to date.                      Contains text generated by Abridge.                                 Contains text generated by Abridge.

## 2023-11-17 ENCOUNTER — Encounter: Payer: Self-pay | Admitting: Family Medicine

## 2023-11-18 ENCOUNTER — Encounter: Payer: Self-pay | Admitting: Family Medicine

## 2023-11-24 ENCOUNTER — Other Ambulatory Visit: Payer: Self-pay | Admitting: Family Medicine

## 2023-11-24 LAB — HM MAMMOGRAPHY

## 2023-11-24 NOTE — Telephone Encounter (Signed)
 11/11/2023 LOV  05/11/2023  30/5 refills

## 2023-12-05 ENCOUNTER — Ambulatory Visit
Admission: EM | Admit: 2023-12-05 | Discharge: 2023-12-05 | Disposition: A | Discharge status: Home / Self Care | Attending: Physician Assistant | Admitting: Physician Assistant

## 2023-12-05 DIAGNOSIS — L821 Other seborrheic keratosis: Secondary | ICD-10-CM | POA: Insufficient documentation

## 2023-12-05 DIAGNOSIS — L304 Erythema intertrigo: Secondary | ICD-10-CM | POA: Insufficient documentation

## 2023-12-05 DIAGNOSIS — L814 Other melanin hyperpigmentation: Secondary | ICD-10-CM | POA: Insufficient documentation

## 2023-12-05 DIAGNOSIS — L578 Other skin changes due to chronic exposure to nonionizing radiation: Secondary | ICD-10-CM | POA: Insufficient documentation

## 2023-12-05 DIAGNOSIS — L719 Rosacea, unspecified: Secondary | ICD-10-CM | POA: Insufficient documentation

## 2023-12-05 DIAGNOSIS — L570 Actinic keratosis: Secondary | ICD-10-CM | POA: Insufficient documentation

## 2023-12-05 DIAGNOSIS — N3000 Acute cystitis without hematuria: Secondary | ICD-10-CM | POA: Diagnosis present

## 2023-12-05 LAB — POCT URINE DIPSTICK
Bilirubin, UA: NEGATIVE
Glucose, UA: 100 mg/dL — AB
Ketones, POC UA: NEGATIVE mg/dL
Nitrite, UA: POSITIVE — AB
POC PROTEIN,UA: 100 — AB
Spec Grav, UA: 1.02 (ref 1.010–1.025)
Urobilinogen, UA: 1 U/dL
pH, UA: 5.5 (ref 5.0–8.0)

## 2023-12-05 MED ORDER — CEPHALEXIN 500 MG PO CAPS: 500.0000 mg | ORAL_CAPSULE | Freq: Two times a day (BID) | ORAL | 0 refills | Status: AC

## 2023-12-05 NOTE — ED Triage Notes (Signed)
 Patient reports symptoms beginning with urinary urgency, frequency followed by pain a few days ago. Today noticing some blood in the urine. Was able to start AZO about 2 days, very little improvement.

## 2023-12-05 NOTE — Discharge Instructions (Signed)
 Take medication as prescribed. Test results available via MyChart in 3 to 5 days.  We will contact you with results.

## 2023-12-05 NOTE — ED Provider Notes (Signed)
 [facility]    CSN: [id number] Arrival date & time: 12/05/23  1226      History   Chief Complaint Chief Complaint  Patient presents with   UTI Symptoms    HPI Taylor Hopkins is a 73 y.o. female.   Patient concerned with UTI x 1 week.  She reports frequency, urgency, dysuria, hematuria which started today.  She is taking Azo without relief.    Past Medical History:  Diagnosis Date   Adenomatous colon polyp 06/2003   Allergic rhinitis    Anxiety    Arthritis    Cataract 2010   bilateral eyes   COVID-19    Depression    Gallstones 07/26/2019   asymtomatic   GERD (gastroesophageal reflux disease)    Sleep apnea    Not currently using CPAP - last sleep study 2016    Patient Active Problem List   Diagnosis Date Noted   Lentigo 12/05/2023   Intertrigo 12/05/2023   Rosacea 12/05/2023   Actinic keratosis 12/05/2023   Seborrheic keratoses 12/05/2023   Sun-damaged skin 12/05/2023   Peripheral neuropathy 05/11/2023   Mixed hyperlipidemia 05/11/2023   Pain of right sacroiliac joint 01/01/2023   Low back pain 12/18/2022   Pain of left hip joint 11/05/2022   Pain of right hip joint 11/05/2022   UGI bleed 10/09/2022   Hiatal hernia 10/09/2022   Notalgia paresthetica 08/11/2022   Adenomatous polyp of colon 07/04/2021   Urinary, incontinence, stress female 03/28/2020   Urinary incontinence, mixed 03/28/2020   Lumbar radiculopathy, chronic 07/26/2019   Seasonal allergic rhinitis due to pollen 07/26/2019   Prediabetes 07/26/2019   Gallstones 07/26/2019   S/P dilatation of esophageal stricture 07/23/2018   Colon polyp 07/04/2018   IBS (irritable bowel syndrome) 07/04/2018   Chronic neck pain due to DJD, DDD, unable to tolerate NSAIDs 07/04/2018   Vitamin D  deficiency 07/04/2018   Insomnia 07/04/2018   Osteoporosis 07/04/2018   Major depression, recurrent, chronic 07/04/2018   OSA (obstructive sleep apnea), not on CPAP 07/04/2018   GERD  (gastroesophageal reflux disease) 10/02/2008    Past Surgical History:  Procedure Laterality Date   ANKLE SURGERY Right    BREAST ENHANCEMENT SURGERY     COLONOSCOPY     LUMBAR DISC SURGERY     TONSILLECTOMY     TOTAL ABDOMINAL HYSTERECTOMY     UPPER GASTROINTESTINAL ENDOSCOPY      OB History   No obstetric history on file.      Home Medications    Prior to Admission medications   Medication Sig Start Date End Date Taking? Authorizing Provider  cephALEXin  (KEFLEX ) 500 MG capsule Take 1 capsule (500 mg total) by mouth 2 (two) times daily. 12/05/23  Yes Juleen Rush, PA-C  fluocinolone 0.01 % cream Apply topically 2 (two) times daily. 09/09/17  Yes [provider]  phenazopyridine (PYRIDIUM) 95 MG tablet Take 95 mg by mouth 3 (three) times daily as needed for pain.   Yes [provider]  triamcinolone  cream (KENALOG ) 0.1 % Apply 1 Application topically 2 (two) times daily. 11/09/19  Yes [provider]  VEVYE 0.1 % SOLN Apply 1 drop to eye 2 (two) times daily. 11/09/23  Yes [provider]  atorvastatin  (LIPITOR) 10 MG tablet TAKE 1 TABLET BY MOUTH EVERYDAY AT BEDTIME 08/24/23   Jodie Lavern CROME, MD  BIOTIN PO Take 3,000 mcg by mouth.    [provider]  buPROPion  (WELLBUTRIN  SR) 150 MG 12 hr tablet TAKE 1  TABLET BY MOUTH TWICE A DAY 10/02/23   Jodie Lavern CROME, MD  busPIRone  (BUSPAR ) 7.5 MG tablet TAKE 1 TABLET (7.5 MG TOTAL) BY MOUTH 3 (THREE) TIMES DAILY AS NEEDED. 10/27/23   Jodie Lavern CROME, MD  clobetasol  (TEMOVATE ) 0.05 % external solution Apply 1 Application topically 2 (two) times daily. 08/18/22   Jodie Lavern CROME, MD  clonazePAM  (KLONOPIN ) 1 MG tablet TAKE 0.5-1 TABLETS (0.5-1 MG TOTAL) BY MOUTH AT BEDTIME AS NEEDED FOR ANXIETY. 11/25/23   Jodie Lavern CROME, MD  dexlansoprazole  (DEXILANT ) 60 MG capsule TAKE 1 CAPSULE BY MOUTH DAILY 09/09/23   Jodie Lavern CROME, MD  gabapentin  (NEURONTIN ) 300 MG capsule Take 1 capsule (300 mg total) by mouth at  bedtime. Patient not taking: No sig reported 08/17/23   Jodie Lavern CROME, MD  hydrocortisone 2.5 % ointment Apply topically 3 (three) times daily.    [provider]  oxybutynin  (DITROPAN  XL) 10 MG 24 hr tablet Take 10 mg by mouth at bedtime. 08/11/22   Jodie Lavern CROME, MD  oxyCODONE -acetaminophen  (PERCOCET/ROXICET) 5-325 MG tablet Take 1 tablet by mouth every 6 (six) hours as needed for severe pain (pain score 7-10). Patient not taking: No sig reported 07/02/23   Roselyn Carlin NOVAK, MD  potassium chloride  SA (KLOR-CON  M) 20 MEQ tablet Take 20 mEq by mouth daily.    [provider]  PROLIA  60 MG/ML SOSY injection Inject 60 mg into the skin every 6 (six) months. 08/12/22   [provider]  traZODone  (DESYREL ) 50 MG tablet TAKE 0.5-1 TABLETS (25-50 MG TOTAL) BY MOUTH AT BEDTIME AS NEEDED. FOR SLEEP 10/02/23   Jodie Lavern CROME, MD  valACYclovir  (VALTREX ) 1000 MG tablet TAKE 2 TABLETS BY MOUTH 2 (TWO) TIMES DAILY. ONCE, AS NEEDED FOR COLD SORES 01/27/23   Jodie Lavern CROME, MD  Vitamin D , Ergocalciferol , (DRISDOL ) 1.25 MG (50000 UNIT) CAPS capsule TAKE 1 CAPSULE BY MOUTH ONE TIME PER WEEK 10/27/23   Jodie Lavern CROME, MD    Family History Family History  Problem Relation Age of Onset   Allergies Brother    Heart attack Father    Colon polyps Father    Diverticulitis Father    Heart failure Father    Esophageal cancer Maternal Grandmother    Heart failure Mother    Mitral valve prolapse Mother    Diabetes Mother    Kidney failure Mother    Stomach cancer Paternal Uncle    Colon cancer Neg Hx    Rectal cancer Neg Hx     Social History Social History   Tobacco Use   Smoking status: Never   Smokeless tobacco: Never  Vaping Use   Vaping status: Never Used  Substance Use Topics   Alcohol use: Yes    Alcohol/week: 0.0 standard drinks of alcohol    Comment: 1 per month   Drug use: No     Allergies   Fluoxetine , Latex, Prozac  [fluoxetine  hcl], Sulfur dioxide, Sulfonamide  derivatives, Sulfur, Adhesive  [tape], Other, Sulfa antibiotics, and Wound dressing adhesive   Review of Systems Review of Systems  Constitutional:  Negative for chills, fatigue and fever.  Gastrointestinal:  Negative for abdominal pain, nausea and vomiting.  Genitourinary:  Positive for dysuria, frequency, hematuria and urgency. Negative for difficulty urinating, flank pain, genital sores, vaginal bleeding, vaginal discharge and vaginal pain.  Musculoskeletal:  Negative for back pain.  Skin:  Negative for rash and wound.  Allergic/Immunologic: Negative for environmental allergies and immunocompromised state.  Neurological:  Negative  for headaches.  Hematological:  Negative for adenopathy. Does not bruise/bleed easily.  Psychiatric/Behavioral:  Negative for sleep disturbance.      Physical Exam Triage Vital Signs ED Triage Vitals  Encounter Vitals Group     BP 12/05/23 1242 117/74     Girls Systolic BP Percentile --      Girls Diastolic BP Percentile --      Boys Systolic BP Percentile --      Boys Diastolic BP Percentile --      Pulse Rate 12/05/23 1242 89     Resp 12/05/23 1242 18     Temp 12/05/23 1242 (!) 97.4 F (36.3 C)     Temp Source 12/05/23 1242 Oral     SpO2 12/05/23 1242 97 %     Weight 12/05/23 1240 178 lb 12.7 oz (81.1 kg)     Height 12/05/23 1240 5' 6 (1.676 m)     Head Circumference --      Peak Flow --      Pain Score 12/05/23 1235 0     Pain Loc --      Pain Education --      Exclude from Growth Chart --    No data found.  Updated Vital Signs BP 117/74 (BP Location: Left Arm)   Pulse 89   Temp (!) 97.4 F (36.3 C) (Oral)   Resp 18   Ht 5' 6 (1.676 m)   Wt 178 lb 12.7 oz (81.1 kg)   SpO2 97%   BMI 28.86 kg/m   Visual Acuity Right Eye Distance:   Left Eye Distance:   Bilateral Distance:    Right Eye Near:   Left Eye Near:    Bilateral Near:     Physical Exam Vitals and nursing note reviewed.  Constitutional:      General: She is not  in acute distress.    Appearance: Normal appearance. She is well-developed. She is not ill-appearing.  HENT:     Head: Normocephalic and atraumatic.     Nose: Nose normal.  Eyes:     General: No scleral icterus.    Extraocular Movements: Extraocular movements intact.     Conjunctiva/sclera: Conjunctivae normal.  Pulmonary:     Effort: Pulmonary effort is normal. No respiratory distress.  Abdominal:     General: Bowel sounds are normal.     Palpations: Abdomen is soft.     Tenderness: There is no abdominal tenderness. There is no right CVA tenderness, left CVA tenderness or guarding.  Musculoskeletal:     Cervical back: Normal range of motion and neck supple. No rigidity.  Skin:    General: Skin is warm and dry.  Neurological:     General: No focal deficit present.     Mental Status: She is alert and oriented to person, place, and time.     Motor: No weakness.     Gait: Gait normal.  Psychiatric:        Mood and Affect: Mood normal.        Behavior: Behavior normal.      UC Treatments / Results  Labs (all labs ordered are listed, but only abnormal results are displayed) Labs Reviewed  POCT URINE DIPSTICK - Abnormal; Notable for the following components:      Result Value   Color, UA orange (*)    Clarity, UA cloudy (*)    Glucose, UA =100 (*)    Blood, UA large (*)    POC PROTEIN,UA =100 (*)  Nitrite, UA Positive (*)    Leukocytes, UA Small (1+) (*)    All other components within normal limits  URINE CULTURE    EKG   Radiology No results found.  Procedures Procedures (including critical care time)  Medications Ordered in UC Medications - No data to display  Initial Impression / Assessment and Plan / UC Course  I have reviewed the triage vital signs and the nursing notes.  Pertinent labs & imaging results that were available during my care of the patient were reviewed by me and considered in my medical decision making (see chart for details).       Final Clinical Impressions(s) / UC Diagnoses   Final diagnoses:  Acute cystitis without hematuria     Discharge Instructions      Take medication as prescribed. Test results available via MyChart in 3 to 5 days.  We will contact you with results.    ED Prescriptions     Medication Sig Dispense Auth. Provider   cephALEXin  (KEFLEX ) 500 MG capsule Take 1 capsule (500 mg total) by mouth 2 (two) times daily. 14 capsule Juleen Rush, PA-C      PDMP not reviewed this encounter.   Juleen Rush, PA-C 12/05/23 1333

## 2023-12-06 LAB — URINE CULTURE: Culture: <10000 — AB

## 2023-12-07 ENCOUNTER — Ambulatory Visit (HOSPITAL_COMMUNITY): Payer: Self-pay

## 2023-12-10 ENCOUNTER — Other Ambulatory Visit: Payer: Self-pay | Admitting: Family Medicine

## 2023-12-21 ENCOUNTER — Encounter: Payer: Self-pay | Admitting: Family Medicine

## 2023-12-21 ENCOUNTER — Ambulatory Visit (INDEPENDENT_AMBULATORY_CARE_PROVIDER_SITE_OTHER): Admitting: Family Medicine

## 2023-12-21 VITALS — BP 170/104 | HR 77 | Temp 98.1°F | Ht 66.0 in | Wt 181.8 lb

## 2023-12-21 DIAGNOSIS — Z630 Problems in relationship with spouse or partner: Secondary | ICD-10-CM

## 2023-12-21 DIAGNOSIS — F339 Major depressive disorder, recurrent, unspecified: Secondary | ICD-10-CM | POA: Diagnosis not present

## 2023-12-21 DIAGNOSIS — F5104 Psychophysiologic insomnia: Secondary | ICD-10-CM

## 2023-12-21 DIAGNOSIS — J301 Allergic rhinitis due to pollen: Secondary | ICD-10-CM

## 2023-12-21 DIAGNOSIS — F4322 Adjustment disorder with anxiety: Secondary | ICD-10-CM | POA: Diagnosis not present

## 2023-12-21 DIAGNOSIS — J029 Acute pharyngitis, unspecified: Secondary | ICD-10-CM

## 2023-12-21 MED ORDER — MONTELUKAST SODIUM 10 MG PO TABS: 10.0000 mg | ORAL_TABLET | Freq: Every day | ORAL | 3 refills | Status: DC

## 2023-12-21 MED ORDER — FLUTICASONE PROPIONATE 50 MCG/ACT NA SUSP: 1.0000 | Freq: Every day | NASAL | 6 refills | Status: AC

## 2023-12-21 NOTE — Patient Instructions (Signed)
 Please follow up if symptoms do not improve or as needed.     VISIT SUMMARY: During today's visit, we discussed your persistent cough, upper respiratory symptoms, fatigue, sleep disturbances, gastrointestinal issues, and recent urinary tract infection. We reviewed your symptoms and made adjustments to your treatment plan to help manage your conditions more effectively.  YOUR PLAN: -ALLERGIC RHINITIS WITH CHRONIC COUGH: Allergic rhinitis is an inflammation of the nasal passages caused by allergies, leading to symptoms like congestion, drainage, and sneezing. To manage this, restart cetirizine (an over-the-counter allergy medication), use fluticasone nasal spray, and take montelukast at night. For your headache, take ibuprofen, and for your sore throat, use a saline gargle. Additionally, focus on good sleep hygiene and stress management.  -ACUTE PHARYNGITIS AND HEADACHE: Acute pharyngitis is an inflammation of the throat, often causing a sore throat and headache. Since your symptoms are likely due to allergies and stress, take ibuprofen for the headache and use a saline gargle for the sore throat. Monitor your symptoms for one week, and if they persist or worsen, we may need to consider treatment for sinusitis.  -PSYCHOSOCIAL STRESS RELATED TO FAMILY DISRUPTION: Stress from family issues can affect your physical health, causing fatigue and sleep disturbances. To help with sleep and relaxation, take magnesium at night.  -GASTROESOPHAGEAL REFLUX DISEASE WITHOUT ESOPHAGITIS: Gastroesophageal reflux disease (GERD) occurs when stomach acid frequently flows back into the tube connecting your mouth and stomach. Continue taking dexlansoprazole  to manage your reflux symptoms effectively. Regular use of stool softeners is also recommended to manage bowel movements.  -GENERAL HEALTH MAINTENANCE: To boost your immune system and overall well-being, consider taking zinc and vitamin C supplements. Magnesium at night  can also help with sleep. I will email you recommendations for supplements via [employer].  INSTRUCTIONS: Monitor your symptoms for one week. If your sore throat and headache persist or worsen, please contact us  to consider treatment for sinusitis.                      Contains text generated by [employer].                                 Contains text generated by [employer].

## 2023-12-21 NOTE — Progress Notes (Signed)
 Subjective  CC:  Chief Complaint  Patient presents with   Cough    Pt stated that she has a cough for the past yr and from all the coughing has made her throat sore along with fatigue    HPI: Taylor Hopkins is a 73 y.o. female who presents to the office today to address the problems listed above in the chief complaint. Discussed the use of AI scribe software for clinical note transcription with the patient, who gave verbal consent to proceed.  History of Present Illness Taylor Hopkins is a 73 year old female who presents with persistent cough and upper respiratory symptoms.  Chronic cough and upper respiratory symptoms - Chronic cough present for over one year, not previously evaluated by a healthcare provider - Mucinex  provides partial relief, but cough recurs consistently - Runny nose, sneezing, and sore throat onset approximately two weeks ago - History of allergies; previously received allergy shots, not currently taking allergy medications - Headaches present - No fever, checks temperature occasionally - No sinus pain - No ear symptoms  Fatigue and sleep disturbance - Fatigue and stress attributed to recent personal issues with husband - Sleep improved last night with trazodone , but overall sleep has been poor over the past week  Gastrointestinal symptoms - Occasional stomach pain, particularly after eating certain foods - Dexilant  used for reflux, with reported improvement in symptoms - Regular use of stool softeners to manage bowel movements  Recent urinary tract infection - Recently completed a course of antibiotics for bladder infection - Urinary symptoms have resolved  Stress reaction with intermarital discord.   Assessment  1. Seasonal allergic rhinitis due to pollen   2. Major depression, recurrent, chronic   3. Transient adjustment reaction with anxiety   4. Psychophysiological insomnia   5. Sore throat   6. Marital conflict      Plan   Assessment and Plan Assessment & Plan Allergic rhinitis with chronic cough Chronic cough and rhinorrhea likely due to allergic rhinitis. Symptoms include congestion, drainage, and sneezing. No current allergy medications. Previous allergy immunotherapy. No sinus pain. Lungs clear on auscultation. - Restart cetirizine (over-the-counter) - Prescribe fluticasone nasal spray - Prescribe montelukast to be taken at night - Recommend ibuprofen for headache - Advise saline gargle for sore throat - Encourage good sleep hygiene and stress management  Acute pharyngitis and headache Sore throat and headache present for two weeks. Throat examination shows mild erythema. No fever. Recent completion of antibiotics for a urinary tract infection. Unlikely sinusitis; probable combination of allergies and stress. - Recommend ibuprofen for headache - Advise saline gargle for sore throat - Monitor symptoms for one week; if symptoms persist or worsen, consider treatment for sinusitis  Psychosocial stress related to family disruption Significant stress due to family issues, particularly with her husband. Reports fatigue and disrupted sleep, though she slept well the previous night with trazodone . Emotional stress likely contributing to physical symptoms. - Recommend magnesium at night for sleep and calm  Gastroesophageal reflux disease without esophagitis Occasional stomach discomfort, especially postprandial. Currently taking dexlansoprazole , effectively managing reflux symptoms. No diarrhea, but uses stool softeners regularly.  General Health Maintenance Discussion on boosting immune system and overall well-being. Consideration of supplements to support immune function and energy levels. - Recommend zinc and vitamin C supplementation - Suggest magnesium at night for sleep - Email recommendations for supplements via [employer]    Follow up: prn No orders of the defined types were placed in this  encounter.  Meds ordered this encounter  Medications   montelukast (SINGULAIR) 10 MG tablet    Sig: Take 1 tablet (10 mg total) by mouth at bedtime.    Dispense:  30 tablet    Refill:  3   fluticasone (FLONASE) 50 MCG/ACT nasal spray    Sig: Place 1 spray into both nostrils daily.    Dispense:  16 g    Refill:  6     I reviewed the patients updated PMH, FH, and SocHx.  Patient Active Problem List   Diagnosis Date Noted   Mixed hyperlipidemia 05/11/2023    Priority: High   Adenomatous polyp of colon 07/04/2021    Priority: High   Lumbar radiculopathy, chronic 07/26/2019    Priority: High   Prediabetes 07/26/2019    Priority: High   Insomnia 07/04/2018    Priority: High   Osteoporosis 07/04/2018    Priority: High   Major depression, recurrent, chronic 07/04/2018    Priority: High   OSA (obstructive sleep apnea), not on CPAP 07/04/2018    Priority: High   Peripheral neuropathy 05/11/2023    Priority: Medium    UGI bleed 10/09/2022    Priority: Medium    Hiatal hernia 10/09/2022    Priority: Medium    Urinary, incontinence, stress female 03/28/2020    Priority: Medium    Urinary incontinence, mixed 03/28/2020    Priority: Medium    Gallstones 07/26/2019    Priority: Medium    S/P dilatation of esophageal stricture 07/23/2018    Priority: Medium    Colon polyp 07/04/2018    Priority: Medium    IBS (irritable bowel syndrome) 07/04/2018    Priority: Medium    Chronic neck pain due to DJD, DDD, unable to tolerate NSAIDs 07/04/2018    Priority: Medium    GERD (gastroesophageal reflux disease) 10/02/2008    Priority: Medium    Notalgia paresthetica 08/11/2022    Priority: Low   Seasonal allergic rhinitis due to pollen 07/26/2019    Priority: Low   Vitamin D  deficiency 07/04/2018    Priority: Low   Lentigo 12/05/2023   Intertrigo 12/05/2023   Rosacea 12/05/2023   Actinic keratosis 12/05/2023   Seborrheic keratoses 12/05/2023   Sun-damaged skin 12/05/2023   Pain  of right sacroiliac joint 01/01/2023   Low back pain 12/18/2022   Pain of left hip joint 11/05/2022   Pain of right hip joint 11/05/2022   Current Meds  Medication Sig   atorvastatin  (LIPITOR) 10 MG tablet TAKE 1 TABLET BY MOUTH EVERYDAY AT BEDTIME   BIOTIN PO Take 3,000 mcg by mouth.   buPROPion  (WELLBUTRIN  SR) 150 MG 12 hr tablet TAKE 1 TABLET BY MOUTH TWICE A DAY   busPIRone  (BUSPAR ) 7.5 MG tablet TAKE 1 TABLET (7.5 MG TOTAL) BY MOUTH 3 (THREE) TIMES DAILY AS NEEDED.   cephALEXin  (KEFLEX ) 500 MG capsule Take 1 capsule (500 mg total) by mouth 2 (two) times daily.   clobetasol  (TEMOVATE ) 0.05 % external solution Apply 1 Application topically 2 (two) times daily.   clonazePAM  (KLONOPIN ) 1 MG tablet TAKE 0.5-1 TABLETS (0.5-1 MG TOTAL) BY MOUTH AT BEDTIME AS NEEDED FOR ANXIETY.   dexlansoprazole  (DEXILANT ) 60 MG capsule TAKE 1 CAPSULE BY MOUTH DAILY   fluocinolone 0.01 % cream Apply topically 2 (two) times daily.   fluticasone (FLONASE) 50 MCG/ACT nasal spray Place 1 spray into both nostrils daily.   gabapentin  (NEURONTIN ) 300 MG capsule Take 1 capsule (300 mg total) by mouth at bedtime.   hydrocortisone  2.5 % ointment Apply topically 3 (three) times daily.   montelukast (SINGULAIR) 10 MG tablet Take 1 tablet (10 mg total) by mouth at bedtime.   oxybutynin  (DITROPAN  XL) 10 MG 24 hr tablet Take 10 mg by mouth at bedtime.   potassium chloride  SA (KLOR-CON  M) 20 MEQ tablet Take 20 mEq by mouth daily.   PROLIA  60 MG/ML SOSY injection Inject 60 mg into the skin every 6 (six) months.   traZODone  (DESYREL ) 50 MG tablet TAKE 0.5-1 TABLETS (25-50 MG TOTAL) BY MOUTH AT BEDTIME AS NEEDED. FOR SLEEP   triamcinolone  cream (KENALOG ) 0.1 % Apply 1 Application topically 2 (two) times daily.   valACYclovir  (VALTREX ) 1000 MG tablet TAKE 2 TABLETS BY MOUTH 2 (TWO) TIMES DAILY. ONCE, AS NEEDED FOR COLD SORES   VEVYE 0.1 % SOLN Apply 1 drop to eye 2 (two) times daily.   Vitamin D , Ergocalciferol , (DRISDOL ) 1.25  MG (50000 UNIT) CAPS capsule TAKE 1 CAPSULE BY MOUTH ONE TIME PER WEEK   [DISCONTINUED] oxyCODONE -acetaminophen  (PERCOCET/ROXICET) 5-325 MG tablet Take 1 tablet by mouth every 6 (six) hours as needed for severe pain (pain score 7-10).   [DISCONTINUED] phenazopyridine (PYRIDIUM) 95 MG tablet Take 95 mg by mouth 3 (three) times daily as needed for pain.   Current Facility-Administered Medications for the 12/21/23 encounter (Office Visit) with Jodie Lavern CROME, MD  Medication   denosumab  (PROLIA ) injection 60 mg   Allergies: Patient is allergic to fluoxetine , latex, prozac  [fluoxetine  hcl], sulfur dioxide, sulfonamide derivatives, sulfur, adhesive  [tape], other, sulfa antibiotics, and wound dressing adhesive. Family History: Patient family history includes Allergies in her brother; Colon polyps in her father; Diabetes in her mother; Diverticulitis in her father; Esophageal cancer in her maternal grandmother; Heart attack in her father; Heart failure in her father and mother; Kidney failure in her mother; Mitral valve prolapse in her mother; Stomach cancer in her paternal uncle. Social History:  Patient  reports that she has never smoked. She has never used smokeless tobacco. She reports current alcohol use. She reports that she does not use drugs.  Review of Systems: Constitutional: Negative for fever malaise or anorexia Cardiovascular: negative for chest pain Respiratory: negative for SOB or persistent cough Gastrointestinal: negative for abdominal pain  Objective  Vitals: BP (!) 170/104   Pulse 77   Temp 98.1 F (36.7 C)   Ht 5' 6 (1.676 m)   Wt 181 lb 12.8 oz (82.5 kg)   SpO2 98%   BMI 29.34 kg/m  General: no acute distress , A&Ox3 HEENT: PEERL, conjunctiva normal, neck is supple, mild red throat w/o exudate with cervical LAD bilaterally, no sinus ttp, nasal congestion present Cardiovascular:  RRR without murmur or gallop.  Respiratory:  Good breath sounds bilaterally, CTAB with  normal respiratory effort Skin:  Warm, no rashes Commons side effects, risks, benefits, and alternatives for medications and treatment plan prescribed today were discussed, and the patient expressed understanding of the given instructions. Patient is instructed to call or message via MyChart if he/she has any questions or concerns regarding our treatment plan. No barriers to understanding were identified. We discussed Red Flag symptoms and signs in detail. Patient expressed understanding regarding what to do in case of urgent or emergency type symptoms.  Medication list was reconciled, printed and provided to the patient in AVS. Patient instructions and summary information was reviewed with the patient as documented in the AVS. This note was prepared with assistance of Dragon voice recognition software. Occasional wrong-word or sound-a-like  substitutions may have occurred due to the inherent limitations of voice recognition software

## 2024-01-02 ENCOUNTER — Other Ambulatory Visit: Payer: Self-pay | Admitting: Family Medicine

## 2024-01-07 ENCOUNTER — Ambulatory Visit: Admitting: Internal Medicine

## 2024-01-18 LAB — OPHTHALMOLOGY REPORT-SCANNED

## 2024-03-07 ENCOUNTER — Other Ambulatory Visit: Payer: Self-pay

## 2024-03-07 ENCOUNTER — Ambulatory Visit: Payer: Self-pay

## 2024-03-07 DIAGNOSIS — M542 Cervicalgia: Secondary | ICD-10-CM

## 2024-03-07 NOTE — Telephone Encounter (Signed)
 FYI Only or Action Required?: Action required by provider: referral request and request for documentation or forms.  Patient was last seen in primary care on 12/21/2023 by Jodie Lavern CROME, MD.  Called Nurse Triage reporting Neck Pain.  Symptoms began several weeks ago.  Interventions attempted: OTC medications: tylenol  and Ice/heat application.  Symptoms are: gradually worsening.  Triage Disposition: See PCP Within 2 Weeks  Patient/caregiver understands and will follow disposition?: Yes    Copied from CRM [id number]. Topic: Clinical - Red Word Triage >> Mar 07, 2024 11:14 AM Adelita E wrote: [facility] that prompted transfer to Nurse Triage: Neck pain. Patient rated pain 7 out of 10, usually gets injection in neck to help.   Reason for Disposition  Neck pain is a chronic symptom (recurrent or ongoing AND present > 4 weeks)  Answer Assessment - Initial Assessment Questions Pt called in requesting referral to Dr. Eldonna, ortho for neck pain. Pt states that PCP referred her to him in the past for injections. Pt states she had the R side of her neck injected and pain is better but L side is still bothering her. Pain worse in am, 7/10 and does not radiate. Pt is taking tylenol  and using heat PRN. Please advise for referral. Pt available via my chart or phone for updates.     1. ONSET: When did the pain begin?      Several weeks ago; worse in am   2. LOCATION: Where does it hurt?      L neck   3. PATTERN Does the pain come and go, or has it been constant since it started?      Comes and goes   4. SEVERITY: How bad is the pain?  (Scale 0-10; or none or slight stiffness, mild, moderate, severe)     7/10  5. RADIATION: Does the pain go anywhere else, shoot into your arms?     No   6. CORD SYMPTOMS: Any weakness or numbness of the arms or legs?     No   7. CAUSE: What do you think is causing the neck pain?     Pt reports hx of neck pain; received injection from  Dr. Eldonna, ortho   8. NECK OVERUSE: Any recent activities that involved turning or twisting the neck?     No   9. OTHER SYMPTOMS: Do you have any other symptoms? (e.g., headache, fever, chest pain, difficulty breathing, neck swelling)     No  Protocols used: Neck Pain or Stiffness-A-AH

## 2024-03-17 ENCOUNTER — Other Ambulatory Visit: Payer: Self-pay | Admitting: Family Medicine

## 2024-03-23 ENCOUNTER — Other Ambulatory Visit: Payer: Self-pay | Admitting: Family Medicine

## 2024-05-02 ENCOUNTER — Ambulatory Visit

## 2024-05-11 ENCOUNTER — Ambulatory Visit: Admitting: Family Medicine
